# Patient Record
Sex: Female | Born: 1965 | Race: Black or African American | Hispanic: No | Marital: Married | Smoking: Never smoker
Health system: Southern US, Community
[De-identification: ages and names within clinical notes are randomized; demographics above are authoritative.]

## PROBLEM LIST (undated history)

## (undated) DIAGNOSIS — K529 Noninfective gastroenteritis and colitis, unspecified: Secondary | ICD-10-CM

## (undated) DIAGNOSIS — E669 Obesity, unspecified: Secondary | ICD-10-CM

## (undated) HISTORY — PX: TOTAL ABDOMINAL HYSTERECTOMY: SHX209

## (undated) HISTORY — DX: Noninfective gastroenteritis and colitis, unspecified: K52.9

## (undated) HISTORY — PX: CHOLECYSTECTOMY: SHX55

## (undated) HISTORY — PX: APPENDECTOMY: SHX54

---

## 2012-07-04 ENCOUNTER — Encounter (HOSPITAL_COMMUNITY): Payer: Self-pay | Admitting: Emergency Medicine

## 2012-07-04 ENCOUNTER — Emergency Department (HOSPITAL_COMMUNITY)
Admission: EM | Admit: 2012-07-04 | Discharge: 2012-07-04 | Disposition: A | Discharge status: Home / Self Care | Payer: BC Managed Care – PPO | Attending: Emergency Medicine | Admitting: Emergency Medicine

## 2012-07-04 ENCOUNTER — Emergency Department (HOSPITAL_COMMUNITY): Payer: BC Managed Care – PPO

## 2012-07-04 DIAGNOSIS — R630 Anorexia: Secondary | ICD-10-CM | POA: Insufficient documentation

## 2012-07-04 DIAGNOSIS — Z90710 Acquired absence of both cervix and uterus: Secondary | ICD-10-CM | POA: Insufficient documentation

## 2012-07-04 DIAGNOSIS — N390 Urinary tract infection, site not specified: Secondary | ICD-10-CM

## 2012-07-04 DIAGNOSIS — R319 Hematuria, unspecified: Secondary | ICD-10-CM | POA: Insufficient documentation

## 2012-07-04 DIAGNOSIS — R112 Nausea with vomiting, unspecified: Secondary | ICD-10-CM | POA: Insufficient documentation

## 2012-07-04 DIAGNOSIS — R109 Unspecified abdominal pain: Secondary | ICD-10-CM | POA: Insufficient documentation

## 2012-07-04 LAB — COMPREHENSIVE METABOLIC PANEL
ALT: 20 U/L (ref 0–35)
AST: 20 U/L (ref 0–37)
Albumin: 4 g/dL (ref 3.5–5.2)
Alkaline Phosphatase: 97 U/L (ref 39–117)
BUN: 9 mg/dL (ref 6–23)
CO2: 28 mEq/L (ref 19–32)
Calcium: 9.8 mg/dL (ref 8.4–10.5)
Chloride: 102 mEq/L (ref 96–112)
Creatinine, Ser: 0.77 mg/dL (ref 0.50–1.10)
GFR calc Af Amer: >90 mL/min (ref >90)
GFR calc non Af Amer: >90 mL/min (ref >90)
Glucose, Bld: 108 mg/dL — ABNORMAL HIGH (ref 70–99)
Potassium: 3.9 mEq/L (ref 3.5–5.1)
Sodium: 139 mEq/L (ref 135–145)
Total Bilirubin: 0.5 mg/dL (ref 0.3–1.2)
Total Protein: 8.4 g/dL — ABNORMAL HIGH (ref 6.0–8.3)

## 2012-07-04 LAB — URINALYSIS, ROUTINE W REFLEX MICROSCOPIC
Bilirubin Urine: NEGATIVE
Glucose, UA: NEGATIVE mg/dL
Hgb urine dipstick: NEGATIVE
Ketones, ur: NEGATIVE mg/dL
Nitrite: POSITIVE — AB
Protein, ur: NEGATIVE mg/dL
Specific Gravity, Urine: 1.022 (ref 1.005–1.030)
Urobilinogen, UA: 0.2 mg/dL (ref 0.0–1.0)
pH: 7.5 (ref 5.0–8.0)

## 2012-07-04 LAB — URINE MICROSCOPIC-ADD ON

## 2012-07-04 LAB — CBC
HCT: 40.8 % (ref 36.0–46.0)
Hemoglobin: 13.2 g/dL (ref 12.0–15.0)
MCH: 25.2 pg — ABNORMAL LOW (ref 26.0–34.0)
MCHC: 32.4 g/dL (ref 30.0–36.0)
MCV: 77.9 fL — ABNORMAL LOW (ref 78.0–100.0)
Platelets: 294 10*3/uL (ref 150–400)
RBC: 5.24 MIL/uL — ABNORMAL HIGH (ref 3.87–5.11)
RDW: 14.9 % (ref 11.5–15.5)
WBC: 3.7 10*3/uL — ABNORMAL LOW (ref 4.0–10.5)

## 2012-07-04 LAB — LIPASE, BLOOD: Lipase: 35 U/L (ref 11–59)

## 2012-07-04 MED ORDER — SULFAMETHOXAZOLE-TRIMETHOPRIM 800-160 MG PO TABS: 1.0000 | ORAL_TABLET | Freq: Two times a day (BID) | ORAL | Status: DC

## 2012-07-04 MED ORDER — ONDANSETRON HCL 4 MG/2ML IJ SOLN
4.0000 mg | Freq: Once | INTRAMUSCULAR | Status: AC
  Administered 2012-07-04: 4 mg via INTRAVENOUS
  Filled 2012-07-04: qty 2

## 2012-07-04 MED ORDER — HYDROMORPHONE HCL PF 1 MG/ML IJ SOLN
1.0000 mg | INTRAMUSCULAR | Status: AC
  Administered 2012-07-04: 1 mg via INTRAVENOUS
  Filled 2012-07-04: qty 1

## 2012-07-04 MED ORDER — SULFAMETHOXAZOLE-TMP DS 800-160 MG PO TABS: 1.0000 | ORAL_TABLET | Freq: Once | ORAL | Status: DC

## 2012-07-04 MED ORDER — IOHEXOL 300 MG/ML  SOLN
20.0000 mL | INTRAMUSCULAR | Status: AC
  Administered 2012-07-04 (×2): 20 mL via ORAL

## 2012-07-04 MED ORDER — HYDROMORPHONE HCL PF 1 MG/ML IJ SOLN: 1.0000 mg | Freq: Once | INTRAMUSCULAR | Status: DC

## 2012-07-04 MED ORDER — ONDANSETRON HCL 4 MG PO TABS: 4.0000 mg | ORAL_TABLET | Freq: Four times a day (QID) | ORAL | Status: DC

## 2012-07-04 MED ORDER — ONDANSETRON HCL 4 MG/2ML IJ SOLN: 4.0000 mg | Freq: Once | INTRAMUSCULAR | Status: DC

## 2012-07-04 MED ORDER — SODIUM CHLORIDE 0.9 % IV BOLUS (SEPSIS)
1000.0000 mL | Freq: Once | INTRAVENOUS | Status: AC
  Administered 2012-07-04: 1000 mL via INTRAVENOUS

## 2012-07-04 MED ORDER — HYDROMORPHONE HCL PF 1 MG/ML IJ SOLN
1.0000 mg | Freq: Once | INTRAMUSCULAR | Status: AC
  Administered 2012-07-04: 1 mg via INTRAVENOUS
  Filled 2012-07-04: qty 1

## 2012-07-04 MED ORDER — HYDROCODONE-ACETAMINOPHEN 5-325 MG PO TABS: 1.0000 | ORAL_TABLET | Freq: Four times a day (QID) | ORAL | Status: DC | PRN

## 2012-07-04 MED ORDER — IOHEXOL 300 MG/ML  SOLN
100.0000 mL | Freq: Once | INTRAMUSCULAR | Status: AC | PRN
  Administered 2012-07-04: 100 mL via INTRAVENOUS

## 2012-07-04 NOTE — ED Notes (Signed)
Pt returned from ct.  Pt lying in bed with call bell at side.

## 2012-07-04 NOTE — ED Notes (Signed)
Pt c/o lower abd and lower back pain onset several days ago.  Also c/o nausea and vomiting no diarrhea.  St's she has been passing blood in her urine.

## 2012-07-04 NOTE — ED Notes (Signed)
IV access attempted by 2 RN's. Unable to establish access. IV team paged.

## 2012-07-04 NOTE — ED Provider Notes (Signed)
History     CSN: [id number]  Arrival date & time 07/04/12  1522   First MD Initiated Contact with Patient 07/04/12 1730      Chief Complaint  Patient presents with  . Abdominal Pain    HPI The patient presents with concerns of ongoing abdominal pain, nausea, vomiting.  Symptoms began several days ago constantly.  Since onset symptoms have been worsening.  Since onset the patient notes anorexia, postprandial nausea and emesis.  No bowel movements. She denies fevers, chills, chest pain, dyspnea. She is a notable history of prior hysterectomy, as well as cholecystectomy, appendectomy. Since onset no relief with OTC medication.  No clear exacerbating factors beyond postprandial emesis.   History reviewed. No pertinent past medical history.  Past Surgical History  Procedure Date  . Abdominal hysterectomy   . Cholecystectomy     No family history on file.  History  Substance Use Topics  . Smoking status: Never Smoker   . Smokeless tobacco: Not on file  . Alcohol Use: No    OB History    Grav Para Term Preterm Abortions TAB SAB Ect Mult Living                  Review of Systems  Constitutional:       Per HPI, otherwise negative  HENT:       Per HPI, otherwise negative  Eyes: Negative.   Respiratory:       Per HPI, otherwise negative  Cardiovascular:       Per HPI, otherwise negative  Gastrointestinal: Positive for nausea, vomiting and abdominal pain. Negative for diarrhea.  Genitourinary: Positive for hematuria.  Musculoskeletal:       Per HPI, otherwise negative  Skin: Negative.   Neurological: Negative for syncope.    Allergies  Review of patient's allergies indicates not on file.  Home Medications  No current outpatient prescriptions on file.  BP 113/79  Pulse 65  Temp 98.7 F (37.1 C) (Oral)  Resp 18  SpO2 100%  Physical Exam  Nursing note and vitals reviewed. Constitutional: She is oriented to person, place, and time. She appears  well-developed and well-nourished. No distress.  HENT:  Head: Normocephalic and atraumatic.  Eyes: Conjunctivae normal and EOM are normal.  Cardiovascular: Normal rate and regular rhythm.   Pulmonary/Chest: Effort normal and breath sounds normal. No stridor. No respiratory distress.  Abdominal: She exhibits no distension. There is generalized tenderness. There is guarding. There is no rigidity and no rebound.  Musculoskeletal: She exhibits no edema.  Neurological: She is alert and oriented to person, place, and time. No cranial nerve deficit.  Skin: Skin is warm and dry.  Psychiatric: She has a normal mood and affect.    ED Course  Procedures (including critical care time)  Labs Reviewed  URINALYSIS, ROUTINE W REFLEX MICROSCOPIC - Abnormal; Notable for the following:    APPearance CLOUDY (*)     Nitrite POSITIVE (*)     Leukocytes, UA SMALL (*)     All other components within normal limits  CBC - Abnormal; Notable for the following:    WBC 3.7 (*)     RBC 5.24 (*)     MCV 77.9 (*)     MCH 25.2 (*)     All other components within normal limits  COMPREHENSIVE METABOLIC PANEL - Abnormal; Notable for the following:    Glucose, Bld 108 (*)     Total Protein 8.4 (*)     All other  components within normal limits  URINE MICROSCOPIC-ADD ON - Abnormal; Notable for the following:    Squamous Epithelial / LPF MANY (*)     Bacteria, UA MANY (*)     All other components within normal limits  LIPASE, BLOOD  URINE CULTURE   No results found.   No diagnosis found.  CT reviewed with the patient.  MDM  This female presents with concerns of abdominal, back pain.  On exam she is in no distress with unremarkable vital signs.  Given her history of multiple prior abdominal surgery, persistent nausea, worse postprandially, there suspicion for SBO.  The patient's CT, and labs were largely reassuring, though there is evidence of a urinary tract infection, as well as renal cysts, pulmonary nodule.   I discussed the need for ongoing evaluation with a primary care physician as well as a urologist.  Absent distress, and with unremarkable vital signs, the patient was discharged in stable condition to  Gerhard Munch, MD 07/04/12 2302

## 2012-07-04 NOTE — ED Notes (Signed)
Pt presents to department for evaluation of lower back and lower abdominal pain. Ongoing x1 week. Also states nausea/vomiting. Also states hematuria. Last BM on Saturday was normal. 9/10 pain at the time. Bowel sounds present all quadrants. Abdomen soft and non tender to palpation. She is alert and oriented x4.

## 2012-07-07 LAB — URINE CULTURE: Colony Count: >=100000

## 2012-07-07 NOTE — ED Notes (Signed)
Copy of chart to EDP office for review of positive urine culture for ESBL.

## 2012-07-10 ENCOUNTER — Telehealth (HOSPITAL_COMMUNITY): Payer: Self-pay | Admitting: Emergency Medicine

## 2012-07-10 NOTE — ED Notes (Signed)
Chart returned from EDP office. Per Rhea Bleacher PA-C, sensitive to Bactrim, patient Rx Bactrim. Follow-up with PCP to ensure resolution.

## 2013-01-09 ENCOUNTER — Encounter (HOSPITAL_COMMUNITY): Payer: Self-pay | Admitting: Emergency Medicine

## 2013-01-09 ENCOUNTER — Inpatient Hospital Stay (HOSPITAL_COMMUNITY)
Admission: RE | Admit: 2013-01-09 | Discharge: 2013-01-09 | Disposition: A | Discharge status: Home / Self Care | Payer: BC Managed Care – PPO | Source: Ambulatory Visit

## 2013-01-09 ENCOUNTER — Emergency Department (HOSPITAL_COMMUNITY)
Admission: EM | Admit: 2013-01-09 | Discharge: 2013-01-09 | Disposition: A | Discharge status: Home / Self Care | Payer: Self-pay | Attending: Emergency Medicine | Admitting: Emergency Medicine

## 2013-01-09 DIAGNOSIS — M79605 Pain in left leg: Secondary | ICD-10-CM

## 2013-01-09 DIAGNOSIS — M7989 Other specified soft tissue disorders: Secondary | ICD-10-CM

## 2013-01-09 DIAGNOSIS — M79609 Pain in unspecified limb: Secondary | ICD-10-CM

## 2013-01-09 DIAGNOSIS — R609 Edema, unspecified: Secondary | ICD-10-CM | POA: Insufficient documentation

## 2013-01-09 LAB — CBC WITH DIFFERENTIAL/PLATELET
Basophils Absolute: 0 10*3/uL (ref 0.0–0.1)
Basophils Relative: 1 % (ref 0–1)
Eosinophils Absolute: 0.3 10*3/uL (ref 0.0–0.7)
Eosinophils Relative: 6 % — ABNORMAL HIGH (ref 0–5)
HCT: 34.3 % — ABNORMAL LOW (ref 36.0–46.0)
Hemoglobin: 11.1 g/dL — ABNORMAL LOW (ref 12.0–15.0)
Lymphocytes Relative: 53 % — ABNORMAL HIGH (ref 12–46)
Lymphs Abs: 2.6 10*3/uL (ref 0.7–4.0)
MCH: 24.9 pg — ABNORMAL LOW (ref 26.0–34.0)
MCHC: 32.4 g/dL (ref 30.0–36.0)
MCV: 77.1 fL — ABNORMAL LOW (ref 78.0–100.0)
Monocytes Absolute: 0.3 10*3/uL (ref 0.1–1.0)
Monocytes Relative: 7 % (ref 3–12)
Neutro Abs: 1.6 10*3/uL — ABNORMAL LOW (ref 1.7–7.7)
Neutrophils Relative %: 34 % — ABNORMAL LOW (ref 43–77)
Platelets: 267 10*3/uL (ref 150–400)
RBC: 4.45 MIL/uL (ref 3.87–5.11)
RDW: 15 % (ref 11.5–15.5)
WBC: 4.8 10*3/uL (ref 4.0–10.5)

## 2013-01-09 LAB — BASIC METABOLIC PANEL
BUN: 14 mg/dL (ref 6–23)
CO2: 25 mEq/L (ref 19–32)
Calcium: 8.9 mg/dL (ref 8.4–10.5)
Chloride: 106 mEq/L (ref 96–112)
Creatinine, Ser: 0.78 mg/dL (ref 0.50–1.10)
GFR calc Af Amer: >90 mL/min (ref >90)
GFR calc non Af Amer: >90 mL/min (ref >90)
Glucose, Bld: 118 mg/dL — ABNORMAL HIGH (ref 70–99)
Potassium: 3.7 mEq/L (ref 3.5–5.1)
Sodium: 137 mEq/L (ref 135–145)

## 2013-01-09 LAB — PROTIME-INR
INR: 0.94 (ref 0.00–1.49)
Prothrombin Time: 12.4 seconds (ref 11.6–15.2)

## 2013-01-09 LAB — APTT: aPTT: 33 seconds (ref 24–37)

## 2013-01-09 MED ORDER — OXYCODONE-ACETAMINOPHEN 5-325 MG PO TABS
2.0000 | ORAL_TABLET | Freq: Once | ORAL | Status: AC
  Administered 2013-01-09: 2 via ORAL
  Filled 2013-01-09: qty 2

## 2013-01-09 MED ORDER — ENOXAPARIN SODIUM 120 MG/0.8ML ~~LOC~~ SOLN
110.0000 mg | Freq: Once | SUBCUTANEOUS | Status: AC
  Administered 2013-01-09: 110 mg via SUBCUTANEOUS
  Filled 2013-01-09: qty 0.8

## 2013-01-09 MED ORDER — OXYCODONE-ACETAMINOPHEN 5-325 MG PO TABS: 1.0000 | ORAL_TABLET | Freq: Four times a day (QID) | ORAL | Status: DC | PRN

## 2013-01-09 NOTE — ED Provider Notes (Signed)
History    CSN: [id number] Arrival date & time 01/09/13  0002  First MD Initiated Contact with Patient 01/09/13 [phone]     Chief Complaint  Patient presents with  . Leg Pain   (Consider location/radiation/quality/duration/timing/severity/associated sxs/prior Treatment) Patient is a 47 y.o. female presenting with leg pain.  Leg Pain  Pt with no significant PMH reports she is visiting family from out of town. She has had about a week of progressively worsening L leg pain and swelling. States pain is so severe now she is unable to walk. No back pain or numbness. No previous history of same. No right sided symptoms. She denies any CP, SOB. No recent extended travel or surgery. No history of cancer.   History reviewed. No pertinent past medical history. Past Surgical History  Procedure Laterality Date  . Abdominal hysterectomy    . Cholecystectomy     No family history on file. History  Substance Use Topics  . Smoking status: Never Smoker   . Smokeless tobacco: Not on file  . Alcohol Use: No   OB History   Grav Para Term Preterm Abortions TAB SAB Ect Mult Living                 Review of Systems All other systems reviewed and are negative except as noted in HPI.   Allergies  Review of patient's allergies indicates no known allergies.  Home Medications   Current Outpatient Rx  Name  Route  Sig  Dispense  Refill  . ibuprofen (ADVIL,MOTRIN) 200 MG tablet   Oral   Take 400 mg by mouth every 8 (eight) hours as needed for pain.         . Multiple Vitamin (MULTIVITAMIN WITH MINERALS) TABS   Oral   Take 1 tablet by mouth daily.         Marland Kitchen OVER THE COUNTER MEDICATION   Oral   Take 1 tablet by mouth once. Over the counter Water Pill          BP 134/62  Pulse 87  Temp(Src) 98.2 F (36.8 C) (Oral)  Resp 20  SpO2 98% Physical Exam  Nursing note and vitals reviewed. Constitutional: She is oriented to person, place, and time. She appears well-developed and  well-nourished.  HENT:  Head: Normocephalic and atraumatic.  Eyes: EOM are normal. Pupils are equal, round, and reactive to light.  Neck: Normal range of motion. Neck supple.  Cardiovascular: Normal rate, normal heart sounds and intact distal pulses.   Pulmonary/Chest: Effort normal and breath sounds normal.  Abdominal: Bowel sounds are normal. She exhibits no distension. There is no tenderness.  Musculoskeletal: Normal range of motion. She exhibits edema (2+ L lower leg) and tenderness (diffuse L leg including calf and medial thigh).  Neurological: She is alert and oriented to person, place, and time. She has normal strength. No cranial nerve deficit or sensory deficit.  Skin: Skin is warm and dry. No rash noted.  Psychiatric: She has a normal mood and affect.    ED Course  Procedures (including critical care time) Labs Reviewed  CBC WITH DIFFERENTIAL - Abnormal; Notable for the following:    Hemoglobin 11.1 (*)    HCT 34.3 (*)    MCV 77.1 (*)    MCH 24.9 (*)    Neutrophils Relative % 34 (*)    Neutro Abs 1.6 (*)    Lymphocytes Relative 53 (*)    Eosinophils Relative 6 (*)    All other components within  normal limits  BASIC METABOLIC PANEL - Abnormal; Notable for the following:    Glucose, Bld 118 (*)    All other components within normal limits  PROTIME-INR  APTT   No results found. 1. Peripheral edema   2. Leg pain, left     MDM  Labs unremarkable. Will d/c with Lovenox and outpatient doppler to rule out DVT. No signs or symptoms of PE.   Charles B. Bernette Mayers, MD 01/09/13 [phone]

## 2013-01-09 NOTE — Progress Notes (Signed)
VASCULAR LAB PRELIMINARY  PRELIMINARY  PRELIMINARY  PRELIMINARY  Left lower extremity venous duplex completed.    Preliminary report:  Left:  No evidence of DVT, superficial thrombosis, or Baker's cyst.   Mishelle Hassan, RVT 01/09/2013, 8:42 AM

## 2013-01-09 NOTE — ED Notes (Signed)
Pt c/o her lt leg swollen for the past 2-3 days.  Leg temp the same as the rt.  Pedal pulses  present
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
## 2013-01-09 NOTE — ED Notes (Signed)
5 4  240 

## 2013-08-25 ENCOUNTER — Encounter (HOSPITAL_COMMUNITY): Payer: Self-pay | Admitting: Emergency Medicine

## 2013-08-25 ENCOUNTER — Emergency Department (HOSPITAL_COMMUNITY)
Admission: EM | Admit: 2013-08-25 | Discharge: 2013-08-25 | Disposition: A | Discharge status: Home / Self Care | Payer: BC Managed Care – PPO | Attending: Emergency Medicine | Admitting: Emergency Medicine

## 2013-08-25 ENCOUNTER — Emergency Department (HOSPITAL_COMMUNITY): Payer: BC Managed Care – PPO

## 2013-08-25 DIAGNOSIS — S0990XA Unspecified injury of head, initial encounter: Secondary | ICD-10-CM | POA: Insufficient documentation

## 2013-08-25 DIAGNOSIS — Z79899 Other long term (current) drug therapy: Secondary | ICD-10-CM | POA: Insufficient documentation

## 2013-08-25 DIAGNOSIS — E669 Obesity, unspecified: Secondary | ICD-10-CM | POA: Insufficient documentation

## 2013-08-25 DIAGNOSIS — IMO0002 Reserved for concepts with insufficient information to code with codable children: Secondary | ICD-10-CM | POA: Insufficient documentation

## 2013-08-25 DIAGNOSIS — Y9389 Activity, other specified: Secondary | ICD-10-CM | POA: Insufficient documentation

## 2013-08-25 DIAGNOSIS — Y92410 Unspecified street and highway as the place of occurrence of the external cause: Secondary | ICD-10-CM | POA: Insufficient documentation

## 2013-08-25 MED ORDER — METHOCARBAMOL 500 MG PO TABS: 500.0000 mg | ORAL_TABLET | Freq: Two times a day (BID) | ORAL | Status: DC

## 2013-08-25 MED ORDER — HYDROCODONE-ACETAMINOPHEN 5-325 MG PO TABS
1.0000 | ORAL_TABLET | Freq: Once | ORAL | Status: AC
  Administered 2013-08-25: 1 via ORAL
  Filled 2013-08-25: qty 1

## 2013-08-25 MED ORDER — METHOCARBAMOL 500 MG PO TABS
500.0000 mg | ORAL_TABLET | Freq: Once | ORAL | Status: AC
  Administered 2013-08-25: 500 mg via ORAL
  Filled 2013-08-25: qty 1

## 2013-08-25 MED ORDER — HYDROCODONE-ACETAMINOPHEN 5-325 MG PO TABS: 2.0000 | ORAL_TABLET | ORAL | Status: DC | PRN
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
## 2013-08-29 NOTE — ED Provider Notes (Signed)
Medical screening examination/treatment/procedure(s) were performed by non-physician practitioner and as supervising physician I was immediately available for consultation/collaboration.  EKG Interpretation   None         Katiejo Gilroy, MD 08/29/13 0739 

## 2013-09-26 ENCOUNTER — Other Ambulatory Visit: Payer: Self-pay | Admitting: Orthopedic Surgery

## 2013-09-26 DIAGNOSIS — IMO0002 Reserved for concepts with insufficient information to code with codable children: Secondary | ICD-10-CM

## 2013-09-27 ENCOUNTER — Ambulatory Visit
Admission: RE | Admit: 2013-09-27 | Discharge: 2013-09-27 | Disposition: A | Discharge status: Home / Self Care | Payer: BC Managed Care – PPO | Source: Ambulatory Visit | Attending: Orthopedic Surgery | Admitting: Orthopedic Surgery

## 2013-09-27 DIAGNOSIS — IMO0002 Reserved for concepts with insufficient information to code with codable children: Secondary | ICD-10-CM

## 2013-10-06 ENCOUNTER — Encounter (HOSPITAL_COMMUNITY): Payer: Self-pay | Admitting: Emergency Medicine

## 2013-10-06 DIAGNOSIS — R197 Diarrhea, unspecified: Secondary | ICD-10-CM | POA: Diagnosis not present

## 2013-10-06 DIAGNOSIS — N390 Urinary tract infection, site not specified: Secondary | ICD-10-CM | POA: Diagnosis present

## 2013-10-06 DIAGNOSIS — E669 Obesity, unspecified: Secondary | ICD-10-CM | POA: Diagnosis present

## 2013-10-06 DIAGNOSIS — K5900 Constipation, unspecified: Secondary | ICD-10-CM | POA: Diagnosis not present

## 2013-10-06 DIAGNOSIS — Z9089 Acquired absence of other organs: Secondary | ICD-10-CM

## 2013-10-06 DIAGNOSIS — Z6841 Body Mass Index (BMI) 40.0 and over, adult: Secondary | ICD-10-CM

## 2013-10-06 DIAGNOSIS — N200 Calculus of kidney: Secondary | ICD-10-CM | POA: Diagnosis present

## 2013-10-06 DIAGNOSIS — A09 Infectious gastroenteritis and colitis, unspecified: Principal | ICD-10-CM | POA: Diagnosis present

## 2013-10-06 NOTE — ED Notes (Signed)
Pt. reports low abdominal cramping with nausea and vomitting onset today , denies diarrhea or urinary discomfort .

## 2013-10-07 ENCOUNTER — Inpatient Hospital Stay (HOSPITAL_COMMUNITY)
Admission: EM | Admit: 2013-10-07 | Discharge: 2013-10-13 | DRG: 392 | Disposition: A | Discharge status: Home / Self Care | Payer: BC Managed Care – PPO | Attending: Internal Medicine | Admitting: Internal Medicine

## 2013-10-07 ENCOUNTER — Emergency Department (HOSPITAL_COMMUNITY): Payer: BC Managed Care – PPO

## 2013-10-07 ENCOUNTER — Encounter (HOSPITAL_COMMUNITY): Payer: Self-pay | Admitting: Radiology

## 2013-10-07 DIAGNOSIS — N390 Urinary tract infection, site not specified: Secondary | ICD-10-CM

## 2013-10-07 DIAGNOSIS — R109 Unspecified abdominal pain: Secondary | ICD-10-CM

## 2013-10-07 DIAGNOSIS — K529 Noninfective gastroenteritis and colitis, unspecified: Secondary | ICD-10-CM | POA: Diagnosis present

## 2013-10-07 DIAGNOSIS — K5289 Other specified noninfective gastroenteritis and colitis: Secondary | ICD-10-CM

## 2013-10-07 DIAGNOSIS — R110 Nausea: Secondary | ICD-10-CM

## 2013-10-07 HISTORY — DX: Obesity, unspecified: E66.9

## 2013-10-07 LAB — CBC WITH DIFFERENTIAL/PLATELET
Basophils Absolute: 0 10*3/uL (ref 0.0–0.1)
Basophils Relative: 1 % (ref 0–1)
Eosinophils Absolute: 0.3 10*3/uL (ref 0.0–0.7)
Eosinophils Relative: 6 % — ABNORMAL HIGH (ref 0–5)
HCT: 37.3 % (ref 36.0–46.0)
Hemoglobin: 12.3 g/dL (ref 12.0–15.0)
Lymphocytes Relative: 42 % (ref 12–46)
Lymphs Abs: 1.9 10*3/uL (ref 0.7–4.0)
MCH: 25.7 pg — ABNORMAL LOW (ref 26.0–34.0)
MCHC: 33 g/dL (ref 30.0–36.0)
MCV: 78 fL (ref 78.0–100.0)
Monocytes Absolute: 0.4 10*3/uL (ref 0.1–1.0)
Monocytes Relative: 9 % (ref 3–12)
Neutro Abs: 1.9 10*3/uL (ref 1.7–7.7)
Neutrophils Relative %: 42 % — ABNORMAL LOW (ref 43–77)
Platelets: 310 10*3/uL (ref 150–400)
RBC: 4.78 MIL/uL (ref 3.87–5.11)
RDW: 15.9 % — ABNORMAL HIGH (ref 11.5–15.5)
WBC: 4.4 10*3/uL (ref 4.0–10.5)

## 2013-10-07 LAB — COMPREHENSIVE METABOLIC PANEL
ALT: 16 U/L (ref 0–35)
AST: 16 U/L (ref 0–37)
Albumin: 3.5 g/dL (ref 3.5–5.2)
Alkaline Phosphatase: 75 U/L (ref 39–117)
BUN: 11 mg/dL (ref 6–23)
CO2: 23 mEq/L (ref 19–32)
Calcium: 9.1 mg/dL (ref 8.4–10.5)
Chloride: 104 mEq/L (ref 96–112)
Creatinine, Ser: 0.75 mg/dL (ref 0.50–1.10)
GFR calc Af Amer: >90 mL/min (ref >90)
GFR calc non Af Amer: >90 mL/min (ref >90)
Glucose, Bld: 112 mg/dL — ABNORMAL HIGH (ref 70–99)
Potassium: 3.9 mEq/L (ref 3.7–5.3)
Sodium: 139 mEq/L (ref 137–147)
Total Bilirubin: 0.3 mg/dL (ref 0.3–1.2)
Total Protein: 7.1 g/dL (ref 6.0–8.3)

## 2013-10-07 LAB — HIV ANTIBODY (ROUTINE TESTING W REFLEX): HIV: NONREACTIVE

## 2013-10-07 LAB — URINALYSIS, ROUTINE W REFLEX MICROSCOPIC
Bilirubin Urine: NEGATIVE
Glucose, UA: NEGATIVE mg/dL
Ketones, ur: 15 mg/dL — AB
Nitrite: POSITIVE — AB
Protein, ur: NEGATIVE mg/dL
Specific Gravity, Urine: 1.025 (ref 1.005–1.030)
Urobilinogen, UA: 0.2 mg/dL (ref 0.0–1.0)
pH: 6 (ref 5.0–8.0)

## 2013-10-07 LAB — C-REACTIVE PROTEIN: CRP: <0.5 mg/dL — ABNORMAL LOW (ref <0.60)

## 2013-10-07 LAB — URINE MICROSCOPIC-ADD ON

## 2013-10-07 LAB — SEDIMENTATION RATE: Sed Rate: 17 mm/hr (ref 0–22)

## 2013-10-07 LAB — LACTIC ACID, PLASMA: Lactic Acid, Venous: 1.1 mmol/L (ref 0.5–2.2)

## 2013-10-07 LAB — LIPASE, BLOOD: Lipase: 21 U/L (ref 11–59)

## 2013-10-07 MED ORDER — ONDANSETRON HCL 4 MG/2ML IJ SOLN
4.0000 mg | Freq: Once | INTRAMUSCULAR | Status: AC
  Administered 2013-10-07: 4 mg via INTRAVENOUS
  Filled 2013-10-07: qty 2

## 2013-10-07 MED ORDER — HYDROMORPHONE HCL PF 1 MG/ML IJ SOLN
1.0000 mg | INTRAMUSCULAR | Status: DC | PRN
  Administered 2013-10-07 – 2013-10-08 (×3): 2 mg via INTRAVENOUS
  Filled 2013-10-07: qty 1
  Filled 2013-10-07: qty 2
  Filled 2013-10-07: qty 1
  Filled 2013-10-07: qty 2

## 2013-10-07 MED ORDER — IOHEXOL 300 MG/ML  SOLN
25.0000 mL | Freq: Once | INTRAMUSCULAR | Status: AC | PRN
  Administered 2013-10-07: 25 mL via ORAL

## 2013-10-07 MED ORDER — PROMETHAZINE HCL 25 MG/ML IJ SOLN
6.2500 mg | Freq: Four times a day (QID) | INTRAMUSCULAR | Status: DC | PRN
  Administered 2013-10-07 – 2013-10-09 (×7): 6.25 mg via INTRAVENOUS
  Filled 2013-10-07 (×7): qty 1

## 2013-10-07 MED ORDER — MORPHINE SULFATE 4 MG/ML IJ SOLN
4.0000 mg | Freq: Once | INTRAMUSCULAR | Status: AC
  Administered 2013-10-07: 4 mg via INTRAVENOUS
  Filled 2013-10-07: qty 1

## 2013-10-07 MED ORDER — HYDROMORPHONE HCL PF 1 MG/ML IJ SOLN
2.0000 mg | INTRAMUSCULAR | Status: DC | PRN
Start: 2013-10-07 — End: 2013-10-07
  Administered 2013-10-07 (×2): 2 mg via INTRAVENOUS
  Filled 2013-10-07 (×2): qty 2

## 2013-10-07 MED ORDER — IOHEXOL 300 MG/ML  SOLN
80.0000 mL | Freq: Once | INTRAMUSCULAR | Status: AC | PRN
  Administered 2013-10-07: 80 mL via INTRAVENOUS

## 2013-10-07 MED ORDER — LEVOFLOXACIN IN D5W 500 MG/100ML IV SOLN
500.0000 mg | Freq: Once | INTRAVENOUS | Status: AC
  Administered 2013-10-07: 500 mg via INTRAVENOUS
  Filled 2013-10-07: qty 100

## 2013-10-07 MED ORDER — MORPHINE SULFATE 4 MG/ML IJ SOLN
4.0000 mg | Freq: Once | INTRAMUSCULAR | Status: AC
Start: 2013-10-07 — End: 2013-10-07
  Administered 2013-10-07: 4 mg via INTRAVENOUS
  Filled 2013-10-07: qty 1

## 2013-10-07 MED ORDER — ONDANSETRON HCL 4 MG/2ML IJ SOLN
4.0000 mg | Freq: Four times a day (QID) | INTRAMUSCULAR | Status: DC | PRN
  Administered 2013-10-07: 4 mg via INTRAVENOUS
  Filled 2013-10-07: qty 2

## 2013-10-07 MED ORDER — HYDROMORPHONE HCL PF 1 MG/ML IJ SOLN
1.0000 mg | INTRAMUSCULAR | Status: DC | PRN
  Administered 2013-10-07: 1 mg via INTRAVENOUS
  Filled 2013-10-07 (×2): qty 1

## 2013-10-07 MED ORDER — DEXTROSE-NACL 5-0.9 % IV SOLN
INTRAVENOUS | Status: AC
  Administered 2013-10-07: 13:00:00 via INTRAVENOUS

## 2013-10-07 MED ORDER — METRONIDAZOLE IN NACL 5-0.79 MG/ML-% IV SOLN
500.0000 mg | Freq: Three times a day (TID) | INTRAVENOUS | Status: DC
  Administered 2013-10-07 – 2013-10-09 (×7): 500 mg via INTRAVENOUS
  Filled 2013-10-07 (×8): qty 100

## 2013-10-07 MED ORDER — ONDANSETRON HCL 4 MG PO TABS: 4.0000 mg | ORAL_TABLET | Freq: Four times a day (QID) | ORAL | Status: DC | PRN

## 2013-10-07 MED ORDER — CIPROFLOXACIN IN D5W 400 MG/200ML IV SOLN
400.0000 mg | Freq: Two times a day (BID) | INTRAVENOUS | Status: DC
  Administered 2013-10-07 – 2013-10-09 (×5): 400 mg via INTRAVENOUS
  Filled 2013-10-07 (×7): qty 200

## 2013-10-07 MED ORDER — METRONIDAZOLE IN NACL 5-0.79 MG/ML-% IV SOLN
500.0000 mg | Freq: Once | INTRAVENOUS | Status: AC
  Administered 2013-10-07: 500 mg via INTRAVENOUS
  Filled 2013-10-07: qty 100

## 2013-10-07 MED ORDER — HYDROMORPHONE HCL PF 1 MG/ML IJ SOLN
1.0000 mg | Freq: Once | INTRAMUSCULAR | Status: AC
  Administered 2013-10-07: 1 mg via INTRAVENOUS
  Filled 2013-10-07: qty 1

## 2013-10-07 MED ORDER — ENOXAPARIN SODIUM 40 MG/0.4ML ~~LOC~~ SOLN
40.0000 mg | Freq: Every day | SUBCUTANEOUS | Status: DC
  Administered 2013-10-07 – 2013-10-13 (×7): 40 mg via SUBCUTANEOUS
  Filled 2013-10-07 (×7): qty 0.4

## 2013-10-07 NOTE — Consult Note (Signed)
Reason for Consult: ileitis  Referring Physician: Dr. Luanne Bras  Connie Mcneil is an 48 y.o. female.  HPI: Patient developed nausea vomiting and lower abdominal pain yesterday. She was evaluated in the emergency department. She was found to Have unremarkable laboratory values. CT scan of the abdomen and pelvis shows distal ileitis. She is continuing to have some pain. We are asked to see her from a surgical standpoint in consultation for assistance in management. The pain is crampy in nature. The medicine has helped since admission. She denies other similar episodes.  Past Medical History  Diagnosis Date  . Obesity     Past Surgical History  Procedure Laterality Date  . Abdominal hysterectomy    . Cholecystectomy    . Appendectomy      No family history on file.  Social History:  reports that she has never smoked. She does not have any smokeless tobacco history on file. She reports that she does not drink alcohol or use illicit drugs.  Allergies: No Known Allergies  Medications:  Scheduled: . ciprofloxacin  400 mg Intravenous Q12H  . enoxaparin (LOVENOX) injection  40 mg Subcutaneous Daily  . metronidazole  500 mg Intravenous Q8H   Continuous: . dextrose 5 % and 0.9% NaCl 100 mL/hr at 10/07/13 1235   EUM:[id number] (DILAUDID) injection, promethazine  Results for orders placed during the hospital encounter of 10/07/13 (from the past 48 hour(s))  CBC WITH DIFFERENTIAL     Status: Abnormal   Collection Time    10/07/13 12:30 AM      Result Value Ref Range   WBC 4.4  4.0 - 10.5 K/uL   RBC 4.78  3.87 - 5.11 MIL/uL   Hemoglobin 12.3  12.0 - 15.0 g/dL   HCT 37.3  36.0 - 46.0 %   MCV 78.0  78.0 - 100.0 fL   MCH 25.7 (*) 26.0 - 34.0 pg   MCHC 33.0  30.0 - 36.0 g/dL   RDW 15.9 (*) 11.5 - 15.5 %   Platelets 310  150 - 400 K/uL   Neutrophils Relative % 42 (*) 43 - 77 %   Neutro Abs 1.9  1.7 - 7.7 K/uL   Lymphocytes Relative 42  12 - 46 %   Lymphs Abs 1.9  0.7 - 4.0 K/uL    Monocytes Relative 9  3 - 12 %   Monocytes Absolute 0.4  0.1 - 1.0 K/uL   Eosinophils Relative 6 (*) 0 - 5 %   Eosinophils Absolute 0.3  0.0 - 0.7 K/uL   Basophils Relative 1  0 - 1 %   Basophils Absolute 0.0  0.0 - 0.1 K/uL  COMPREHENSIVE METABOLIC PANEL     Status: Abnormal   Collection Time    10/07/13 12:30 AM      Result Value Ref Range   Sodium 139  137 - 147 mEq/L   Potassium 3.9  3.7 - 5.3 mEq/L   Chloride 104  96 - 112 mEq/L   CO2 23  19 - 32 mEq/L   Glucose, Bld 112 (*) 70 - 99 mg/dL   BUN 11  6 - 23 mg/dL   Creatinine, Ser 0.75  0.50 - 1.10 mg/dL   Calcium 9.1  8.4 - 10.5 mg/dL   Total Protein 7.1  6.0 - 8.3 g/dL   Albumin 3.5  3.5 - 5.2 g/dL   AST 16  0 - 37 U/L   ALT 16  0 - 35 U/L   Alkaline Phosphatase 75  39 - 117 U/L   Total Bilirubin 0.3  0.3 - 1.2 mg/dL   GFR calc non Af Amer >90  >90 mL/min   GFR calc Af Amer >90  >90 mL/min   Comment: (NOTE)     The eGFR has been calculated using the CKD EPI equation.     This calculation has not been validated in all clinical situations.     eGFR's persistently <90 mL/min signify possible Chronic Kidney     Disease.  URINALYSIS, ROUTINE W REFLEX MICROSCOPIC     Status: Abnormal   Collection Time    10/07/13  4:06 AM      Result Value Ref Range   Color, Urine YELLOW  YELLOW   APPearance TURBID (*) CLEAR   Specific Gravity, Urine 1.025  1.005 - 1.030   pH 6.0  5.0 - 8.0   Glucose, UA NEGATIVE  NEGATIVE mg/dL   Hgb urine dipstick SMALL (*) NEGATIVE   Bilirubin Urine NEGATIVE  NEGATIVE   Ketones, ur 15 (*) NEGATIVE mg/dL   Protein, ur NEGATIVE  NEGATIVE mg/dL   Urobilinogen, UA 0.2  0.0 - 1.0 mg/dL   Nitrite POSITIVE (*) NEGATIVE   Leukocytes, UA LARGE (*) NEGATIVE  URINE MICROSCOPIC-ADD ON     Status: Abnormal   Collection Time    10/07/13  4:06 AM      Result Value Ref Range   Squamous Epithelial / LPF FEW (*) RARE   Bacteria, UA MANY (*) RARE   Crystals CA OXALATE CRYSTALS (*) NEGATIVE  LIPASE, BLOOD      Status: None   Collection Time    10/07/13  5:09 AM      Result Value Ref Range   Lipase 21  11 - 59 U/L  LACTIC ACID, PLASMA     Status: None   Collection Time    10/07/13 11:57 AM      Result Value Ref Range   Lactic Acid, Venous 1.1  0.5 - 2.2 mmol/L    Ct Abdomen Pelvis W Contrast  10/07/2013   CLINICAL DATA:  Lower abdominal cramping, nausea and vomiting.  EXAM: CT ABDOMEN AND PELVIS WITH CONTRAST  TECHNIQUE: Multidetector CT imaging of the abdomen and pelvis was performed using the standard protocol following bolus administration of intravenous contrast.  CONTRAST:  65m OMNIPAQUE IOHEXOL 300 MG/ML  SOLN  COMPARISON:  CT of the abdomen and pelvis performed 07/04/2012  FINDINGS: The visualized lung bases are clear. Mild left lower lobe bronchiectasis is noted, with mild associated atelectasis.  The liver and spleen are unremarkable in appearance. The patient is status post cholecystectomy, with clips noted at the gallbladder fossa. The pancreas and adrenal glands are unremarkable.  Innumerable scattered bilateral renal cysts are seen. A 3 mm stone is noted at the lower pole of the left kidney. The kidneys are otherwise unremarkable. There is no evidence of hydronephrosis. No obstructing ureteral stones are seen. No perinephric stranding is appreciated.  A relatively long segment of wall thickening is noted along the mid to distal ileum, without evidence for obstruction. Mild associated soft tissue inflammation is seen. Findings are concerning for infectious or inflammatory ileitis.  The stomach is within normal limits. No acute vascular abnormalities are seen.  The appendix is not definitely seen; there is no evidence of appendicitis. The colon is unremarkable in appearance.  The bladder is mildly distended and grossly unremarkable. No inguinal lymphadenopathy is seen.  No acute osseous abnormalities are identified.  IMPRESSION: 1. Relatively long segment  of wall thickening along the mid to distal  ileum, without evidence for obstruction. Mild associated soft tissue inflammation seen. Findings concerning for infectious or inflammatory ileitis. The more distal ileum is decompressed and grossly unremarkable. 2. Innumerable scattered bilateral renal cysts seen. 3 mm nonobstructing stone noted at the lower pole of the left kidney. 3. Mild left lower lobe bronchiectasis noted, with mild associated atelectasis.   Electronically Signed   By: Garald Balding M.D.   On: 10/07/2013 05:48    Review of Systems  Constitutional: Positive for malaise/fatigue.  HENT: Negative.   Eyes: Negative.   Respiratory: Negative.   Cardiovascular: Negative.   Gastrointestinal: Positive for nausea, vomiting and abdominal pain.  Genitourinary: Negative.   Musculoskeletal: Negative.   Skin: Negative.   Neurological: Negative.   Endo/Heme/Allergies: Negative.    Blood pressure 100/63, pulse 76, temperature 97.9 F (36.6 C), temperature source Oral, resp. rate 20, height _0  (1.626 m), weight 269 lb 4.8 oz (122.154 kg), SpO2 100.00%. Physical Exam  Constitutional: She is oriented to person, place, and time. She appears well-developed and well-nourished.  HENT:  Head: Normocephalic and atraumatic.  Mouth/Throat: No oropharyngeal exudate.  Voice very raspy  Eyes: Pupils are equal, round, and reactive to light. Right eye exhibits no discharge. Left eye exhibits no discharge.  Neck: Normal range of motion. Neck supple. No tracheal deviation present.  Cardiovascular: Normal rate, normal heart sounds and intact distal pulses.   Respiratory: Effort normal and breath sounds normal. No stridor. No respiratory distress. She has no wheezes. She has no rales.  GI: Soft. She exhibits no distension. There is tenderness. There is no rebound and no guarding.  Tender bilateral lower quadrants without guarding, no generalized peritonitis, hypoactive bowel sounds, no masses  Musculoskeletal: Normal range of motion.   Neurological: She is alert and oriented to person, place, and time.  Skin: Skin is warm.  Psychiatric: She has a normal mood and affect.    Assessment/Plan: Distal ileitis: this is a pretty long segment favoring infectious ideology. Inflammatory bowel disease such as Crohn's is also a possibility. Agree with bowel rest and IV antibiotics. Consider GI workup for inflammatory bowel disease. No need for acute surgical intervention but we will follow. Plan D/W patient.  Rossanna Spitzley E 10/07/2013, 2:58 PM

## 2013-10-07 NOTE — ED Notes (Signed)
Patient ready for transport.

## 2013-10-07 NOTE — Progress Notes (Signed)
Report received from ED at 0820 and pt arrived on the unit via stretcher at [phone]. Pt able to ambulate from the stretcher to the bed with supervision but noted to be unsteady on feet; IV remain intact and infusing; A&O x4. Pt reported N/V and an abd pain of 10 in which she describe it as labor pains. Pt oriented to the unit, call light with reach, VS taken, pt n/v and pain to be addressed. Call light with reach and will continue to monitor pt quietly. Arabella MerlesP. Amo Shasta Chinn RN.

## 2013-10-07 NOTE — Progress Notes (Signed)
Utilization review completed. Grainger Mccarley, RN, BSN. 

## 2013-10-07 NOTE — Progress Notes (Addendum)
RN received call from infectious disease to place pt on contact precaution d/t pt testing positive for EBL in recent admission in January. "contact precaution" explained to pt and pt voices understanding. Pt on contact precaution. Dionne BucyP. Amo Ellyson Rarick RN

## 2013-10-07 NOTE — Progress Notes (Signed)
Pt 12 lead EKG completed and placed in pt chart for MD to review. Will continue to monitor pt quietly. Arabella MerlesP. Amo Allure Greaser RN.

## 2013-10-07 NOTE — ED Provider Notes (Signed)
CSN: [id number]     Arrival date & time 10/06/13  2334 History   First MD Initiated Contact with Patient 10/07/13 [phone]     Chief Complaint  Patient presents with  . Abdominal Pain     (Consider location/radiation/quality/duration/timing/severity/associated sxs/prior Treatment) HPI Comments: 48 yo female with h/o cholecystectomy and appendectomy and TAH (remote) presents to ER with cc of 24 hrs of ABD pain and n/v.   No f/c, no CP, Cough, f/ud, vd, vb, or other symptoms.   Pt reports normal BM.  Last BM yesterday.  Normal.  No sick contacts, no suspicious food intake.      Patient is a 48 y.o. female presenting with abdominal pain. The history is provided by the patient.  Abdominal Pain Pain location:  Epigastric Pain quality: aching   Pain quality: not bloating, not burning, not cramping, not dull, no fullness, not gnawing, not heavy, no pressure, not sharp, not shooting, not squeezing, not stabbing, no stiffness, not tearing, not throbbing and not tugging   Pain radiates to:  Does not radiate Pain severity:  Severe Onset quality:  Gradual Duration:  1 day Timing:  Constant Progression:  Worsening Chronicity:  New Context: not alcohol use, not awakening from sleep, not diet changes, not eating, not laxative use, not medication withdrawal, not previous surgeries, not recent illness, not recent sexual activity, not recent travel, not retching, not sick contacts, not suspicious food intake and not trauma   Relieved by:  Nothing Worsened by:  Movement Ineffective treatments:  NSAIDs Associated symptoms: nausea and vomiting   Associated symptoms: no anorexia, no belching, no chest pain, no chills, no constipation, no cough, no diarrhea, no dysuria, no fatigue, no fever, no flatus, no hematemesis, no hematochezia, no hematuria, no melena, no shortness of breath, no sore throat, no vaginal bleeding and no vaginal discharge     Past Medical History  Diagnosis Date  . Obesity    Past  Surgical History  Procedure Laterality Date  . Abdominal hysterectomy    . Cholecystectomy     No family history on file. History  Substance Use Topics  . Smoking status: Never Smoker   . Smokeless tobacco: Not on file  . Alcohol Use: No   OB History   Grav Para Term Preterm Abortions TAB SAB Ect Mult Living                 Review of Systems  Constitutional: Negative for fever, chills and fatigue.  HENT: Negative.  Negative for sore throat.   Eyes: Negative.   Respiratory: Negative for cough and shortness of breath.   Cardiovascular: Negative for chest pain.  Gastrointestinal: Positive for nausea, vomiting and abdominal pain. Negative for diarrhea, constipation, blood in stool, melena, hematochezia, anal bleeding, rectal pain, anorexia, flatus and hematemesis.  Endocrine: Negative.   Genitourinary: Negative for dysuria, frequency, hematuria, flank pain, vaginal bleeding, vaginal discharge, enuresis, difficulty urinating, genital sores, menstrual problem, pelvic pain and dyspareunia.  Musculoskeletal: Negative.   Skin: Negative.   Allergic/Immunologic: Negative.   Neurological: Negative.       Allergies  Review of patient's allergies indicates no known allergies.  Home Medications   Current Outpatient Rx  Name  Route  Sig  Dispense  Refill  . Multiple Vitamin (MULTIVITAMIN WITH MINERALS) TABS   Oral   Take 1 tablet by mouth daily.         . Naproxen Sodium (ALEVE PO)   Oral   Take 2 tablets by mouth every  6 (six) hours as needed (pain).          BP 131/69  Pulse 75  Temp(Src) 98.2 F (36.8 C) (Oral)  Resp 22  Ht 5\' 4"  (1.626 m)  Wt 216 lb (97.977 kg)  BMI 37.06 kg/m2  SpO2 98% Physical Exam  Nursing note and vitals reviewed. Constitutional: She appears well-developed and well-nourished.  HENT:  Head: Normocephalic.  Mouth/Throat: Oropharynx is clear and moist.  Eyes: Conjunctivae are normal. Right eye exhibits no discharge. Left eye exhibits no  discharge.  Neck: Normal range of motion. No JVD present. No tracheal deviation present.  Cardiovascular: Normal rate and regular rhythm.   No murmur heard. Pulmonary/Chest: Effort normal and breath sounds normal. No stridor. No respiratory distress. She has no wheezes. She has no rales.  Abdominal: Soft. Bowel sounds are normal. She exhibits no distension. There is no hepatosplenomegaly. There is tenderness. There is no rigidity, no rebound, no guarding, no CVA tenderness, no tenderness at McBurney's point and negative Murphy's sign. No hernia.    Musculoskeletal: Normal range of motion. She exhibits no edema and no tenderness.  Neurological: She is alert. She exhibits normal muscle tone.  Skin: She is not diaphoretic.  No pelvic exam as pt is w/o vaginal symptoms.     ED Course  Procedures (including critical care time) Labs Review Labs Reviewed  CBC WITH DIFFERENTIAL - Abnormal; Notable for the following:    MCH 25.7 (*)    RDW 15.9 (*)    Neutrophils Relative % 42 (*)    Eosinophils Relative 6 (*)    All other components within normal limits  COMPREHENSIVE METABOLIC PANEL - Abnormal; Notable for the following:    Glucose, Bld 112 (*)    All other components within normal limits  URINALYSIS, ROUTINE W REFLEX MICROSCOPIC   Imaging Review No results found.   EKG Interpretation None      MDM   Final diagnoses:  Ileitis  Abdominal pain  UTI   48 yo AA female with cc of n/v and abdominal pain.  Pt with h/o appy, chole, TAH.    EDC:   Pt found to have UTI and ileitis.  AFVSS.  WBC 4.4.  Written for Flagyl and Levaquin.  Pain significant and required Morphine x 2 and Dilaudid.    I spoke with Internal medicine teaching service and pt is to be admitted for bowel rest and further evaluation.  Pt was updated and agrees with plan.    7:53 AM Pt t/o to Dr. Gwendolyn GrantWalden pending admission.      Darlys Galesavid Ahnesty Finfrock, MD 10/07/13 [phone]

## 2013-10-07 NOTE — H&P (Addendum)
INTERNAL MEDICINE TEACHING ATTENDING NOTE  Day 0 of stay  Patient name: Connie Mcneil  MRN: [id number] Date of birth: 03/15/1966   48 y.o.female admitted with acute abdomen. Her past medical history is consistent with multiple surgeries, and ileitis. However she reports that the pain she is having currently is 10/10 intensity and she has never had such an episode before.  Pain is - centrally located, started suddenly, has no radiation, present all the time, is not relieved by lying still, she was not able to describe the quality of the pain. Accompanying symptoms: Nausea - no vomiting since admission. Diarrhea 2 weeks back.  Pertinent negatives - no fever, chills, chest pain, dizziness, abdominal distension, blood in stool, pain radiating to the back.   Filed Vitals:   10/07/13 0745 10/07/13 0800 10/07/13 0815 10/07/13 0843  BP: 100/60 101/55 106/59 100/63  Pulse: 61 67 69 76  Temp:    97.9 F (36.6 C)  TempSrc:    Oral  Resp: 25 18 21 20   Height:    5\' 4"  (1.626 m)  Weight:    269 lb 4.8 oz (122.154 kg)  SpO2: 100% 100% 100% 100%    Upon evaluation- She is an obese woman in visible distress. She complains of 10/10 pain. Her abdomen is soft, non-distended, no discoloration, tender to palpation diffusely but most prominently in the center and epigastric region. There appears to be no hepatomegaly. There is mild guarding, no rebound tenderness. She is not tachycardic, and heart sounds are normal. Lungs were not examined as the patient found sitting up very painful. There is no significant pedal edema.    I have reviewed the labs and images.  I agree with the management plan per Dr Yetta BarreJones, with these additions:   Acute Abdomen with CT demonstrated ileitis - The pain looks out of proportion to the state CT findings to me. Acute mesenteric ischemia (SMA occlusion) is strong consideration - given the sudden, periumblical, severe nature of the pain accompanied by nausea and vomiting. SMA occlusion  can have mild abdominal exam initially with no or minimal lactic acid accumalation.   Serial abdominal exams to be done.   Lactic Acid serial checks  D-Dimer levels to exclude ischemia.   Surgical consult will be obtained by my resident team.   We will consider mesenteric angiography if the patient's condition does not improve.  The other possibility for this patient is inflammatory bowel disease, possibly Crohn's. However, she does not give a history consistent with that.   I agree with the current antibiotic cover.   GI consult will be obtained by my resident team.  I have seen and evaluated this patient and discussed it with my IM resident team.  Please see the rest of the plan per resident note from today.   Ainsley Deakins 10/07/2013, 2:22 PM.

## 2013-10-07 NOTE — H&P (Signed)
Date: 10/07/2013               Patient Name:  Connie Mcneil MRN: [id number]  DOB: 1966-02-12 Age / Sex: 48 y.o., female   PCP: Provider Default, MD         Medical Service: Internal Medicine Teaching Service         Attending Physician: Dr. Madilyn Fireman, MD    First Contact: Dr. Ronnald Ramp Pager: [phone]  Second Contact: Dr. Eula Fried Pager: [phone]       After Hours (After 5p/  First Contact Pager: [phone]  weekends / holidays): Second Contact Pager: [phone]   Chief Complaint: Abdominal pain, nausea, vomiting  History of Present Illness: Connie Mcneil is a 48 y.o. female w/ PMHx of obesity, h/o ileitis, s/p cholecystectomy and appendectomy, presents to the ED w/ complaints of abdominal pain, nausea, and vomiting. Patient claims symptoms started yesterday w/ abdominal pain that she claims started as a nagging pain, centrally located, which became progressively worse. Last night she tried to eat some food to see if it helped with the pain, but she claims her pain remained unchanged, 10/10 in severity. She also took some Aleve which she says may have made the pain worse. Last night, after she ate, she says that she had an episode of vomiting (NB/NB). She claims she has never had this sort of pain before. She otherwise denies fever, chills, diarrhea, recent sick contact or travel history. She also denies SOB, chest pain, dizziness, lightheadedness, or palpitations. She also denies any recent dysuria, hematuria, increased frequency, or urgency. The patient claims she had some loose stools last week, but otherwise denies diarrhea.  Patient has a h/o previous cholecystectomy, appendectomy, and hysterectomy, all dating back to the 1990's. She denies any h/o smoking, alcohol, or drug abuse.   Meds: Current Facility-Administered Medications  Medication Dose Route Frequency Provider Last Rate Last Dose  . ciprofloxacin (CIPRO) IVPB 400 mg  400 mg Intravenous Q12H Corky Sox, MD      . dextrose  5 %-0.9 % sodium chloride infusion   Intravenous Continuous Corky Sox, MD      . enoxaparin (LOVENOX) injection 40 mg  40 mg Subcutaneous Daily Corky Sox, MD      . HYDROmorphone (DILAUDID) injection 1 mg  1 mg Intravenous Q2H PRN Corky Sox, MD      . metroNIDAZOLE (FLAGYL) IVPB 500 mg  500 mg Intravenous Q8H Corky Sox, MD      . ondansetron [facility]) tablet 4 mg  4 mg Oral Q6H PRN Corky Sox, MD       Or  . ondansetron [facility]) injection 4 mg  4 mg Intravenous Q6H PRN Corky Sox, MD       Medications Prior to Admission  Medication Sig Dispense Refill  . Multiple Vitamin (MULTIVITAMIN WITH MINERALS) TABS Take 1 tablet by mouth daily.      . Naproxen Sodium (ALEVE PO) Take 2 tablets by mouth every 6 (six) hours as needed (pain).       Allergies: Allergies as of 10/06/2013  . (No Known Allergies)   Past Medical History  Diagnosis Date  . Obesity    Past Surgical History  Procedure Laterality Date  . Abdominal hysterectomy    . Cholecystectomy     No family history on file. History   Social History  . Marital Status: Married    Spouse Name: N/A    Number of Children: N/A  .  Years of Education: N/A   Occupational History  . Not on file.   Social History Main Topics  . Smoking status: Never Smoker   . Smokeless tobacco: Not on file  . Alcohol Use: No  . Drug Use: No  . Sexual Activity: Not on file   Other Topics Concern  . Not on file   Social History Narrative  . No narrative on file   Review of Systems: General: Denies fever, chills, diaphoresis, appetite change and fatigue.  Respiratory: Denies SOB, DOE, cough, chest tightness, and wheezing.   Cardiovascular: Denies chest pain and palpitations.  Gastrointestinal: Positive for nausea, vomiting, and abdominal pain. Denies diarrhea, constipation, blood in stool and abdominal distention.  Genitourinary: Denies dysuria, urgency, frequency, hematuria, and flank pain. Endocrine: Denies hot or cold  intolerance, polyuria, and polydipsia. Musculoskeletal: Positive for left knee pain. Denies myalgias, back pain, joint swelling, and gait problem.  Skin: Denies pallor, rash and wounds.  Neurological: Denies dizziness, seizures, syncope, weakness, lightheadedness, numbness and headaches.  Psychiatric/Behavioral: Denies mood changes, confusion, nervousness, sleep disturbance and agitation.  Physical Exam: Filed Vitals:   10/07/13 0745 10/07/13 0800 10/07/13 0815 10/07/13 0843  BP: 100/60 101/55 106/59 100/63  Pulse: 61 67 69 76  Temp:    97.9 F (36.6 C)  TempSrc:    Oral  Resp: _0 Height:    _1  (1.626 m)  Weight:    269 lb 4.8 oz (122.154 kg)  SpO2: 100% 100% 100% 100%  General: Vital signs reviewed.  Patient is a well-developed and well-nourished, in no acute distress and cooperative with exam.  Head: Normocephalic and atraumatic. Eyes: PERRL, EOMI, conjunctivae normal, No scleral icterus.  Neck: Supple, trachea midline, normal ROM, No JVD, masses, thyromegaly, or carotid bruit present.  Cardiovascular: RRR, S1 normal, S2 normal, no murmurs, gallops, or rubs. Pulmonary/Chest: Air entry equal bilaterally, no wheezes, rales, or rhonchi. Abdominal: Soft, significantly tender to palpation diffusely, mostly central/epigastric in nature, non-distended, BS +, no masses or organomegaly. Mild rebound and guarding present.   Musculoskeletal: No joint deformities, erythema, or stiffness, ROM full and nontender. Extremities: Trace pitting edema,  pulses symmetric and intact bilaterally. No cyanosis or clubbing. Neurological: A&O x3, Strength is normal and symmetric bilaterally, cranial nerve II-XII are grossly intact, no focal motor deficit, sensory intact to light touch bilaterally.  Skin: Warm, dry and intact. No rashes or erythema. Psychiatric: Normal mood and affect. speech and behavior is normal. Cognition and memory are normal.   Lab results: Basic Metabolic Panel:  Recent  Labs  10/07/13 0030  NA 139  K 3.9  CL 104  CO2 23  GLUCOSE 112*  BUN 11  CREATININE 0.75  CALCIUM 9.1   Liver Function Tests:  Recent Labs  10/07/13 0030  AST 16  ALT 16  ALKPHOS 75  BILITOT 0.3  PROT 7.1  ALBUMIN 3.5    Recent Labs  10/07/13 0509  LIPASE 21   CBC:  Recent Labs  10/07/13 0030  WBC 4.4  NEUTROABS 1.9  HGB 12.3  HCT 37.3  MCV 78.0  PLT 310   Urinalysis:  Recent Labs  10/07/13 0406  COLORURINE YELLOW  LABSPEC 1.025  PHURINE 6.0  GLUCOSEU NEGATIVE  HGBUR SMALL*  BILIRUBINUR NEGATIVE  KETONESUR 15*  PROTEINUR NEGATIVE  UROBILINOGEN 0.2  NITRITE POSITIVE*  LEUKOCYTESUR LARGE*   Imaging results:  Ct Abdomen Pelvis W Contrast  10/07/2013   CLINICAL DATA:  Lower abdominal cramping, nausea and vomiting.  EXAM: CT ABDOMEN AND PELVIS WITH CONTRAST  TECHNIQUE: Multidetector CT imaging of the abdomen and pelvis was performed using the standard protocol following bolus administration of intravenous contrast.  CONTRAST:  2m OMNIPAQUE IOHEXOL 300 MG/ML  SOLN  COMPARISON:  CT of the abdomen and pelvis performed 07/04/2012  FINDINGS: The visualized lung bases are clear. Mild left lower lobe bronchiectasis is noted, with mild associated atelectasis.  The liver and spleen are unremarkable in appearance. The patient is status post cholecystectomy, with clips noted at the gallbladder fossa. The pancreas and adrenal glands are unremarkable.  Innumerable scattered bilateral renal cysts are seen. A 3 mm stone is noted at the lower pole of the left kidney. The kidneys are otherwise unremarkable. There is no evidence of hydronephrosis. No obstructing ureteral stones are seen. No perinephric stranding is appreciated.  A relatively long segment of wall thickening is noted along the mid to distal ileum, without evidence for obstruction. Mild associated soft tissue inflammation is seen. Findings are concerning for infectious or inflammatory ileitis.  The stomach is  within normal limits. No acute vascular abnormalities are seen.  The appendix is not definitely seen; there is no evidence of appendicitis. The colon is unremarkable in appearance.  The bladder is mildly distended and grossly unremarkable. No inguinal lymphadenopathy is seen.  No acute osseous abnormalities are identified.  IMPRESSION: 1. Relatively long segment of wall thickening along the mid to distal ileum, without evidence for obstruction. Mild associated soft tissue inflammation seen. Findings concerning for infectious or inflammatory ileitis. The more distal ileum is decompressed and grossly unremarkable. 2. Innumerable scattered bilateral renal cysts seen. 3 mm nonobstructing stone noted at the lower pole of the left kidney. 3. Mild left lower lobe bronchiectasis noted, with mild associated atelectasis.   Electronically Signed   By: JGarald BaldingM.D.   On: 10/07/2013 05:48   Other results: EKG: none  Assessment & Plan by Problem: Ms. Connie Grittonis a 48y.o. female w/ PMHx of obesity, h/o ileitis, s/p cholecystectomy and appendectomy, admitted for suspected ileitis and UTI.  Ileitis- Patient w/ nausea, vomiting, and significant abdominal pain since yesterday. Denies sick contacts or possibility of food-borne illness, or recent travel. Claims she has never had this before, however, ED note from 07/04/2012 shows visit for abdominal pain, discharged home at that time. Pain very severe during this admission, given Morphine 4 mg x2 + Dilaudid 1 mg in ED. CT abdomen showed a long segment of wall thickening along the mid to distal ileum, without evidence for obstruction and mild associated soft tissue inflammation. These findings are suggestive of infectious vs inflammatory ileitis. BMP showed normal electrolytes, no significant AG (12). Do not suspect ischemic colitis given absence of AG. Lipase 21 on admission, no h/o alcohol abuse and patient s/p cholecystectomy several years ago, therefore  pancreatitis unlikely. Given Levaquin + Flagyl in ED. -Admit to med-surg -Continue Cipro + Flagyl IV  -NPO for now -Dilaudid 1 mg q2h prn -IVF; D5NS @ 100 ml/hr for 10 hours -Lactic acid pending -EKG pending -Blood cultures pending -ESR + CRP pending  UTI- Patient denies h/o dysuria, hematuria, increased frequency, urgency, or flank pain, however, given severity of abdominal pain, may also be related. [facility] ED shows nitrites, large leukocytes, few squames, many bacteria, and 15 ketones. No leukocytosis. Patient also shown to have calcium oxalate crystals + CT abdomen shows 3 mm stone noted at the lower pole of the left kidney. -Urine culture pending (has been positive for  E. Coli in the past) -Cipro + Flagyl as above  DVT/PE PPx- Lovenox [place]  Dispo: Disposition is deferred at this time, awaiting improvement of current medical problems. Anticipated discharge in approximately 1-2 day(s).   The patient does not have a current PCP (Provider Default, MD) and does not need an [facility] hospital follow-up appointment after discharge.  The patient does not have transportation limitations that hinder transportation to clinic appointments.  Signed: Corky Sox, MD 10/07/2013, 9:35 AM

## 2013-10-08 DIAGNOSIS — K5289 Other specified noninfective gastroenteritis and colitis: Secondary | ICD-10-CM

## 2013-10-08 DIAGNOSIS — R110 Nausea: Secondary | ICD-10-CM

## 2013-10-08 DIAGNOSIS — R109 Unspecified abdominal pain: Secondary | ICD-10-CM

## 2013-10-08 LAB — BASIC METABOLIC PANEL
BUN: 9 mg/dL (ref 6–23)
CO2: 23 mEq/L (ref 19–32)
Calcium: 8.7 mg/dL (ref 8.4–10.5)
Chloride: 107 mEq/L (ref 96–112)
Creatinine, Ser: 0.79 mg/dL (ref 0.50–1.10)
GFR calc Af Amer: >90 mL/min (ref >90)
GFR calc non Af Amer: >90 mL/min (ref >90)
Glucose, Bld: 109 mg/dL — ABNORMAL HIGH (ref 70–99)
Potassium: 4.4 mEq/L (ref 3.7–5.3)
Sodium: 141 mEq/L (ref 137–147)

## 2013-10-08 LAB — LACTIC ACID, PLASMA
Lactic Acid, Venous: 0.7 mmol/L (ref 0.5–2.2)
Lactic Acid, Venous: 0.6 mmol/L (ref 0.5–2.2)

## 2013-10-08 MED ORDER — HYDROMORPHONE HCL PF 1 MG/ML IJ SOLN
1.0000 mg | INTRAMUSCULAR | Status: DC | PRN
  Administered 2013-10-08 (×5): 1 mg via INTRAVENOUS
  Filled 2013-10-08 (×5): qty 1

## 2013-10-08 MED ORDER — HYDROMORPHONE HCL PF 1 MG/ML IJ SOLN
2.0000 mg | INTRAMUSCULAR | Status: DC | PRN
  Administered 2013-10-08: 1 mg via INTRAVENOUS
  Administered 2013-10-09 (×3): 2 mg via INTRAVENOUS
  Filled 2013-10-08 (×5): qty 2

## 2013-10-08 MED ORDER — DEXTROSE-NACL 5-0.9 % IV SOLN
INTRAVENOUS | Status: AC
  Administered 2013-10-08: 10:00:00 via INTRAVENOUS

## 2013-10-08 MED ORDER — ONDANSETRON HCL 4 MG/2ML IJ SOLN: 4.0000 mg | Freq: Four times a day (QID) | INTRAMUSCULAR | Status: DC | PRN

## 2013-10-08 MED ORDER — HYDROMORPHONE HCL PF 1 MG/ML IJ SOLN
1.0000 mg | INTRAMUSCULAR | Status: DC | PRN
  Administered 2013-10-08: 1 mg via INTRAVENOUS
  Filled 2013-10-08: qty 1

## 2013-10-08 MED ORDER — ONDANSETRON HCL 4 MG/2ML IJ SOLN
4.0000 mg | Freq: Three times a day (TID) | INTRAMUSCULAR | Status: DC
  Administered 2013-10-08 (×2): 4 mg via INTRAVENOUS
  Filled 2013-10-08 (×2): qty 2

## 2013-10-08 MED ORDER — ONDANSETRON HCL 4 MG/2ML IJ SOLN
4.0000 mg | Freq: Three times a day (TID) | INTRAMUSCULAR | Status: AC
Start: 2013-10-08 — End: 2013-10-10
  Administered 2013-10-09 – 2013-10-10 (×5): 4 mg via INTRAVENOUS
  Filled 2013-10-08 (×5): qty 2

## 2013-10-08 NOTE — Progress Notes (Signed)
Subjective: Temp reported at 99, but I think she feels warmer.  Abdomen is diffusely tender, and she feels really bad. Last BM 4/1.  Objective: Vital signs in last 24 hours: Temp:  [98.9 F (37.2 C)-99.8 F (37.7 C)] 99.8 F (37.7 C) (04/04 1014) Pulse Rate:  [74-89] 89 (04/04 1014) Resp:  [20] 20 (04/04 1014) BP: (101-151)/(52-89) 101/59 mmHg (04/04 1014) SpO2:  [91 %-100 %] 91 % (04/04 1014) Last BM Date: 10/06/13  Intake/Output from previous day:   Intake/Output this shift:    General appearance: alert, mild distress and she clearly feels bad. GI: soft very tender all over.  ? BS.  Lab Results:   Recent Labs  10/07/13 0030  WBC 4.4  HGB 12.3  HCT 37.3  PLT 310    BMET  Recent Labs  10/07/13 0030 10/08/13 0746  NA 139 141  K 3.9 4.4  CL 104 107  CO2 23 23  GLUCOSE 112* 109*  BUN 11 9  CREATININE 0.75 0.79  CALCIUM 9.1 8.7   PT/INR No results found for this basename: LABPROT, INR,  in the last 72 hours   Recent Labs Lab 10/07/13 0030  AST 16  ALT 16  ALKPHOS 75  BILITOT 0.3  PROT 7.1  ALBUMIN 3.5     Lipase     Component Value Date/Time   LIPASE 21 10/07/2013 0509     Studies/Results: Ct Abdomen Pelvis W Contrast  10/07/2013   CLINICAL DATA:  Lower abdominal cramping, nausea and vomiting.  EXAM: CT ABDOMEN AND PELVIS WITH CONTRAST  TECHNIQUE: Multidetector CT imaging of the abdomen and pelvis was performed using the standard protocol following bolus administration of intravenous contrast.  CONTRAST:  80mL OMNIPAQUE IOHEXOL 300 MG/ML  SOLN  COMPARISON:  CT of the abdomen and pelvis performed 07/04/2012  FINDINGS: The visualized lung bases are clear. Mild left lower lobe bronchiectasis is noted, with mild associated atelectasis.  The liver and spleen are unremarkable in appearance. The patient is status post cholecystectomy, with clips noted at the gallbladder fossa. The pancreas and adrenal glands are unremarkable.  Innumerable scattered  bilateral renal cysts are seen. A 3 mm stone is noted at the lower pole of the left kidney. The kidneys are otherwise unremarkable. There is no evidence of hydronephrosis. No obstructing ureteral stones are seen. No perinephric stranding is appreciated.  A relatively long segment of wall thickening is noted along the mid to distal ileum, without evidence for obstruction. Mild associated soft tissue inflammation is seen. Findings are concerning for infectious or inflammatory ileitis.  The stomach is within normal limits. No acute vascular abnormalities are seen.  The appendix is not definitely seen; there is no evidence of appendicitis. The colon is unremarkable in appearance.  The bladder is mildly distended and grossly unremarkable. No inguinal lymphadenopathy is seen.  No acute osseous abnormalities are identified.  IMPRESSION: 1. Relatively long segment of wall thickening along the mid to distal ileum, without evidence for obstruction. Mild associated soft tissue inflammation seen. Findings concerning for infectious or inflammatory ileitis. The more distal ileum is decompressed and grossly unremarkable. 2. Innumerable scattered bilateral renal cysts seen. 3 mm nonobstructing stone noted at the lower pole of the left kidney. 3. Mild left lower lobe bronchiectasis noted, with mild associated atelectasis.   Electronically Signed   By: Roanna Raider M.D.   On: 10/07/2013 05:48    Medications: . ciprofloxacin  400 mg Intravenous Q12H  . enoxaparin (LOVENOX) injection  40 mg Subcutaneous  Daily  . metronidazole  500 mg Intravenous Q8H   . dextrose 5 % and 0.9% NaCl 75 mL/hr at 10/08/13 1026   Prior to Admission medications   Medication Sig Start Date End Date Taking? Authorizing Provider  Multiple Vitamin (MULTIVITAMIN WITH MINERALS) TABS Take 1 tablet by mouth daily.   Yes Historical Provider, MD  Naproxen Sodium (ALEVE PO) Take 2 tablets by mouth every 6 (six) hours as needed (pain).   Yes Historical  Provider, MD      Assessment/Plan Nausea and vomiting with abdominal pain, diarrhea 2 weeks ago. Ileitis of uncertain etiology Prior hx of hysterectomy, appendectomy and cholecystectomy Body mass index is 46.2 kg/(m^2).  Normal labs, CT shows:  Relatively long segment of wall thickening along the mid to distal ileum, without evidence for obstruction. Mild associated soft tissue inflammation seen. Findings concerning for infectious or inflammatory ileitis. The more distal ileum is decompressed.   Plan :  We will follow with you.  LOS: 1 day    Connie Mcneil 10/08/2013

## 2013-10-08 NOTE — Consult Note (Signed)
Consultation (we are covering unassigned service for Drs. Hung/Mann this weekend)  Referring Provider:  [facility]    Primary Care Physician:  Default, Provider, MD Primary Gastroenterologist:  Althia Forts      Reason for Consultation: ileitis              HPI:   Connie Mcneil is a 48 y.o. female with no significant PMH who presented to ED yesterday with abdominal pain , nausea and vomiting. Her labs were fairly unremarkable with normal WBC and ESR. Urinalysis with + nitrites. CTscan reveals long segment of wall thickening along the mid to distal ileum, without evidence for obstruction. Patient had a few days of non-bloody diarrhea 2 weeks ago, unusual for her. Then, on Thursday the nausea, vomiting and diffuse abdominal pain began. Nausea frequent but vomits mainly with any PO. She has a low grade temp today but was afebrile at home. Prior to diarrhea 2 weeks ago she felt fine. No recent travel. No known sick contacts. She took Aleve for abdominal pain a few days ago but no prior use of NSAIDS on regular basis. Only takes ASA at home. Weight is stable. No [place] of IBD.  Past Medical History  Diagnosis Date  . Obesity     Past Surgical History  Procedure Laterality Date  . Abdominal hysterectomy    . Cholecystectomy    . Appendectomy     FM[facility]: No IBD, No colon cancer   History  Substance Use Topics  . Smoking status: Never Smoker   . Smokeless tobacco: Not on file  . Alcohol Use: No    Prior to Admission medications   Medication Sig Start Date End Date Taking? Authorizing Provider  Multiple Vitamin (MULTIVITAMIN WITH MINERALS) TABS Take 1 tablet by mouth daily.   Yes Historical Provider, MD  Naproxen Sodium (ALEVE PO) Take 2 tablets by mouth every 6 (six) hours as needed (pain).   Yes Historical Provider, MD    Current Facility-Administered Medications  Medication Dose Route Frequency Provider Last Rate Last Dose  . ciprofloxacin (CIPRO) IVPB 400 mg  400 mg  Intravenous Q12H Corky Sox, MD   400 mg at 10/07/13 2238  . enoxaparin (LOVENOX) injection 40 mg  40 mg Subcutaneous Daily Corky Sox, MD   40 mg at 10/07/13 1235  . HYDROmorphone (DILAUDID) injection 1 mg  1 mg Intravenous Q4H PRN Corky Sox, MD   1 mg at 10/08/13 0907  . metroNIDAZOLE (FLAGYL) IVPB 500 mg  500 mg Intravenous Q8H Corky Sox, MD   500 mg at 10/08/13 0245  . promethazine (PHENERGAN) injection 6.25 mg  6.25 mg Intravenous Q6H PRN Jerene Pitch, MD   6.25 mg at 10/08/13 0835    Allergies as of 10/06/2013  . (No Known Allergies)    Review of Systems:    All systems reviewed and negative except where noted in HPI.   Physical Exam:  Vital signs in last 24 hours: Temp:  [98.9 F (37.2 C)-99.6 F (37.6 C)] 99.6 F (37.6 C) (04/04 0547) Pulse Rate:  [74-84] 84 (04/04 0547) Resp:  [20] 20 (04/04 0547) BP: (113-151)/(52-89) 151/89 mmHg (04/04 0547) SpO2:  [100 %] 100 % (04/04 0547) Last BM Date: 10/06/13 General:   Pleasant 48 year old black female in NAD Head:  Normocephalic and atraumatic. Eyes:   No icterus.   Conjunctiva pink. Ears:  Normal auditory acuity. Neck:  Supple; no masses felt Lungs:  Respirations even and unlabored. Lungs  clear to auscultation bilaterally.   No wheezes, crackles, or rhonchi.  Heart:  Regular rate and rhythm Abdomen:  Soft, obese, nondistended. She is diffusely tender, but more so in upper abdomen. Hypoactive bowel sounds. No appreciable masses  Rectal:  Not performed.  Msk:  Symmetrical without gross deformities.  Extremities:  Without edema. Neurologic:  Alert and  oriented x4;  grossly normal neurologically. Skin:  Intact without significant lesions or rashes. Cervical Nodes:  No significant cervical adenopathy. Psych:  Alert and cooperative. Normal affect.  LAB RESULTS:  Recent Labs  10/07/13 0030  WBC 4.4  HGB 12.3  HCT 37.3  PLT 310   BMET  Recent Labs  10/07/13 0030 10/08/13 0746  NA 139 141  K 3.9 4.4   CL 104 107  CO2 23 23  GLUCOSE 112* 109*  BUN 11 9  CREATININE 0.75 0.79  CALCIUM 9.1 8.7   LFT  Recent Labs  10/07/13 0030  PROT 7.1  ALBUMIN 3.5  AST 16  ALT 16  ALKPHOS 75  BILITOT 0.3   STUDIES: Ct Abdomen Pelvis W Contrast  10/07/2013   CLINICAL DATA:  Lower abdominal cramping, nausea and vomiting.  EXAM: CT ABDOMEN AND PELVIS WITH CONTRAST  TECHNIQUE: Multidetector CT imaging of the abdomen and pelvis was performed using the standard protocol following bolus administration of intravenous contrast.  CONTRAST:  16m OMNIPAQUE IOHEXOL 300 MG/ML  SOLN  COMPARISON:  CT of the abdomen and pelvis performed 07/04/2012  FINDINGS: The visualized lung bases are clear. Mild left lower lobe bronchiectasis is noted, with mild associated atelectasis.  The liver and spleen are unremarkable in appearance. The patient is status post cholecystectomy, with clips noted at the gallbladder fossa. The pancreas and adrenal glands are unremarkable.  Innumerable scattered bilateral renal cysts are seen. A 3 mm stone is noted at the lower pole of the left kidney. The kidneys are otherwise unremarkable. There is no evidence of hydronephrosis. No obstructing ureteral stones are seen. No perinephric stranding is appreciated.  A relatively long segment of wall thickening is noted along the mid to distal ileum, without evidence for obstruction. Mild associated soft tissue inflammation is seen. Findings are concerning for infectious or inflammatory ileitis.  The stomach is within normal limits. No acute vascular abnormalities are seen.  The appendix is not definitely seen; there is no evidence of appendicitis. The colon is unremarkable in appearance.  The bladder is mildly distended and grossly unremarkable. No inguinal lymphadenopathy is seen.  No acute osseous abnormalities are identified.  IMPRESSION: 1. Relatively long segment of wall thickening along the mid to distal ileum, without evidence for obstruction. Mild  associated soft tissue inflammation seen. Findings concerning for infectious or inflammatory ileitis. The more distal ileum is decompressed and grossly unremarkable. 2. Innumerable scattered bilateral renal cysts seen. 3 mm nonobstructing stone noted at the lower pole of the left kidney. 3. Mild left lower lobe bronchiectasis noted, with mild associated atelectasis.   Electronically Signed   By: JGarald BaldingM.D.   On: 10/07/2013 05:48   PREVIOUS ENDOSCOPIES:            none   Impression / Plan:   159 48year old female with acute nausea/vomiting and diffuse abdominal pain preceded by diarrheal illness two weeks ago. CTscan with contrast reveals relatively long non-obstructing segment of wall thickening along the mid to distal ileum with mild associated soft tissue inflammation. Doubt IBD given such acute onset. Doubt ischemic. No significant NSAID use. Suspect  recent infectious enteritis (?yersinia) especially given diarrheal illness two weeks prior. Her labs are reassuring. She is quite tender on exam, especially in upper abdomen but that could be secondary to vomiting. Agree with cipro / flagyl, anti-emetics, supportive care for now.   2. Status post remote appendectomy, cholecystectomy and hysterectomy.   Thanks   LOS: 1 day   Tye Savoy  10/08/2013, 9:18 AM   ________________________________________________________________________  Vel[facility] Heckler GI MD note:  I personally examined the patient, reviewed the data and agree with the assessment and plan described above.  Probably acute infection, less likely ischemic/IBD/other.  Should continue supportive care (with iv abx, iv fluids, pain meds for now).  She is not obstructed, will offer clears.  Will start scheduled anti-emetics (zofran 14m iv every 8 hours for 2 days).   DOwens Loffler MD LHigh [facility]oscopy[facility] Pager [phone]

## 2013-10-08 NOTE — Progress Notes (Signed)
Subjective: Patient seen at bedside this AM. Still with significant abdominal pain, mostly central. No rebound or guarding. Bowel sound hypoactive. No vomiting.  Objective: Vital signs in last 24 hours: Filed Vitals:   10/07/13 0815 10/07/13 0843 10/07/13 2144 10/08/13 0547  BP: 106/59 100/63 113/52 151/89  Pulse: 69 76 74 84  Temp:  97.9 F (36.6 C) 98.9 F (37.2 C) 99.6 F (37.6 C)  TempSrc:  Oral Oral Oral  Resp: _0 Height:  5' 4" (1.626 m)    Weight:  269 lb 4.8 oz (122.154 kg)    SpO2: 100% 100% 100% 100%   Weight change: 53 lb 4.8 oz (24.177 kg) No intake or output data in the 24 hours ending 10/08/13 1002  Physical Exam: General: Alert, cooperative, NAD. HEENT: PERRL, EOMI. Moist mucus membranes Neck: Full range of motion without pain, supple, no lymphadenopathy or carotid bruits Lungs: Clear to ascultation bilaterally, normal work of respiration, no wheezes, rales, rhonchi Heart: RRR, no murmurs, gallops, or rubs Abdomen: Soft, tender diffusely, non-distended, BS hypoactive. No rebound or guarding. Extremities: No cyanosis, clubbing, or edema Neurologic: Alert & oriented X3, cranial nerves II-XII intact, strength grossly intact, sensation intact to light touch  Lab Results: Basic Metabolic Panel:  Recent Labs Lab 10/07/13 0030 10/08/13 0746  NA 139 141  K 3.9 4.4  CL 104 107  CO2 23 23  GLUCOSE 112* 109*  BUN 11 9  CREATININE 0.75 0.79  CALCIUM 9.1 8.7   Liver Function Tests:  Recent Labs Lab 10/07/13 0030  AST 16  ALT 16  ALKPHOS 75  BILITOT 0.3  PROT 7.1  ALBUMIN 3.5    Recent Labs Lab 10/07/13 0509  LIPASE 21   CBC:  Recent Labs Lab 10/07/13 0030  WBC 4.4  NEUTROABS 1.9  HGB 12.3  HCT 37.3  MCV 78.0  PLT 310   Urinalysis:  Recent Labs Lab 10/07/13 0406  COLORURINE YELLOW  LABSPEC 1.025  PHURINE 6.0  GLUCOSEU NEGATIVE  HGBUR SMALL*  BILIRUBINUR NEGATIVE  KETONESUR 15*  PROTEINUR NEGATIVE  UROBILINOGEN  0.2  NITRITE POSITIVE*  LEUKOCYTESUR LARGE*   Micro Results: No results found for this or any previous visit (from the past 240 hour(s)). Studies/Results: Ct Abdomen Pelvis W Contrast  10/07/2013   CLINICAL DATA:  Lower abdominal cramping, nausea and vomiting.  EXAM: CT ABDOMEN AND PELVIS WITH CONTRAST  TECHNIQUE: Multidetector CT imaging of the abdomen and pelvis was performed using the standard protocol following bolus administration of intravenous contrast.  CONTRAST:  10m OMNIPAQUE IOHEXOL 300 MG/ML  SOLN  COMPARISON:  CT of the abdomen and pelvis performed 07/04/2012  FINDINGS: The visualized lung bases are clear. Mild left lower lobe bronchiectasis is noted, with mild associated atelectasis.  The liver and spleen are unremarkable in appearance. The patient is status post cholecystectomy, with clips noted at the gallbladder fossa. The pancreas and adrenal glands are unremarkable.  Innumerable scattered bilateral renal cysts are seen. A 3 mm stone is noted at the lower pole of the left kidney. The kidneys are otherwise unremarkable. There is no evidence of hydronephrosis. No obstructing ureteral stones are seen. No perinephric stranding is appreciated.  A relatively long segment of wall thickening is noted along the mid to distal ileum, without evidence for obstruction. Mild associated soft tissue inflammation is seen. Findings are concerning for infectious or inflammatory ileitis.  The stomach is within normal limits. No acute vascular abnormalities are seen.  The appendix is not  definitely seen; there is no evidence of appendicitis. The colon is unremarkable in appearance.  The bladder is mildly distended and grossly unremarkable. No inguinal lymphadenopathy is seen.  No acute osseous abnormalities are identified.  IMPRESSION: 1. Relatively long segment of wall thickening along the mid to distal ileum, without evidence for obstruction. Mild associated soft tissue inflammation seen. Findings concerning  for infectious or inflammatory ileitis. The more distal ileum is decompressed and grossly unremarkable. 2. Innumerable scattered bilateral renal cysts seen. 3 mm nonobstructing stone noted at the lower pole of the left kidney. 3. Mild left lower lobe bronchiectasis noted, with mild associated atelectasis.   Electronically Signed   By: Garald Balding M.D.   On: 10/07/2013 05:48   Medications: I have reviewed the patient's current medications. Scheduled Meds: . ciprofloxacin  400 mg Intravenous Q12H  . enoxaparin (LOVENOX) injection  40 mg Subcutaneous Daily  . metronidazole  500 mg Intravenous Q8H   Continuous Infusions:  PRN Meds:.HYDROmorphone (DILAUDID) injection, promethazine  Assessment/Plan: [name] is a 48 y.o. female w/ PMHx of obesity, h/o ileitis, s/p cholecystectomy and appendectomy, admitted for suspected ileitis and UTI.  Ileitis- Patient still w/ mild nausea and continued abdominal pain today. Seen by surgery yesterday, do not suspect ischemia, no need for surgical intervention. Question of IBD, however, CRP and ESR wnl. Repeat lactic acid this AM 0.7, no acidosis or AG on BMP. Given CT findings and labs, suspect infectious ileitis.  -GI recs pending -Continue Cipro + Flagyl -Keep NPO given continued abdominal pain + nausea -Dilaudid 1 mg q2h prn -Phenergan prn nausea -IVF; D5 NS @ 75 ml/hr for now -Blood cultures pending -Stool pathogen panel  UTI- Patient continues to deny symptoms. Per chart review, patient has had urine culture positive for ESBL E. Coli in 2012.  -Urine culture pending -Cipro + Flagyl as above; will narrow w/ culture results  DVT/PE PPx- Lovenox   Dispo: Disposition is deferred at this time, awaiting improvement of current medical problems.  Anticipated discharge in approximately 1-2 day(s).   The patient does not have a current PCP (Provider Default, MD) and does not need an [facility] hospital follow-up appointment after discharge.  The  patient does not have transportation limitations that hinder transportation to clinic appointments.  .Services Needed at time of discharge: Y = Yes, Blank = No PT:   OT:   RN:   Equipment:   Other:     LOS: 1 day   Corky Sox, MD 10/08/2013, 10:02 AM

## 2013-10-08 NOTE — Progress Notes (Signed)
Agree with above, does not look ischemic, hopefully resolves conservatively.

## 2013-10-09 ENCOUNTER — Inpatient Hospital Stay (HOSPITAL_COMMUNITY): Payer: BC Managed Care – PPO

## 2013-10-09 LAB — COMPREHENSIVE METABOLIC PANEL
ALT: 12 U/L (ref 0–35)
AST: 16 U/L (ref 0–37)
Albumin: 2.8 g/dL — ABNORMAL LOW (ref 3.5–5.2)
Alkaline Phosphatase: 66 U/L (ref 39–117)
BUN: 8 mg/dL (ref 6–23)
CO2: 24 mEq/L (ref 19–32)
Calcium: 8.5 mg/dL (ref 8.4–10.5)
Chloride: 105 mEq/L (ref 96–112)
Creatinine, Ser: 0.83 mg/dL (ref 0.50–1.10)
GFR calc Af Amer: >90 mL/min (ref >90)
GFR calc non Af Amer: 81 mL/min — ABNORMAL LOW (ref >90)
Glucose, Bld: 99 mg/dL (ref 70–99)
Potassium: 3.9 mEq/L (ref 3.7–5.3)
Sodium: 140 mEq/L (ref 137–147)
Total Bilirubin: 0.3 mg/dL (ref 0.3–1.2)
Total Protein: 6.1 g/dL (ref 6.0–8.3)

## 2013-10-09 LAB — CBC
HCT: 35 % — ABNORMAL LOW (ref 36.0–46.0)
Hemoglobin: 11 g/dL — ABNORMAL LOW (ref 12.0–15.0)
MCH: 24.9 pg — ABNORMAL LOW (ref 26.0–34.0)
MCHC: 31.4 g/dL (ref 30.0–36.0)
MCV: 79.4 fL (ref 78.0–100.0)
Platelets: 260 10*3/uL (ref 150–400)
RBC: 4.41 MIL/uL (ref 3.87–5.11)
RDW: 16.2 % — ABNORMAL HIGH (ref 11.5–15.5)
WBC: 4.8 10*3/uL (ref 4.0–10.5)

## 2013-10-09 LAB — OCCULT BLOOD X 1 CARD TO LAB, STOOL: Fecal Occult Bld: NEGATIVE

## 2013-10-09 LAB — LIPASE, BLOOD: Lipase: 12 U/L (ref 11–59)

## 2013-10-09 MED ORDER — DEXTROSE-NACL 5-0.9 % IV SOLN
INTRAVENOUS | Status: DC
  Administered 2013-10-09 – 2013-10-10 (×2): via INTRAVENOUS

## 2013-10-09 MED ORDER — SODIUM CHLORIDE 0.9 % IV BOLUS (SEPSIS)
500.0000 mL | Freq: Once | INTRAVENOUS | Status: AC
  Administered 2013-10-09: 500 mL via INTRAVENOUS

## 2013-10-09 MED ORDER — IOHEXOL 300 MG/ML  SOLN
100.0000 mL | Freq: Once | INTRAMUSCULAR | Status: AC | PRN
  Administered 2013-10-09: 100 mL via INTRAVENOUS

## 2013-10-09 MED ORDER — PIPERACILLIN-TAZOBACTAM 3.375 G IVPB
3.3750 g | Freq: Three times a day (TID) | INTRAVENOUS | Status: DC
  Administered 2013-10-09 – 2013-10-11 (×6): 3.375 g via INTRAVENOUS
  Filled 2013-10-09 (×8): qty 50

## 2013-10-09 MED ORDER — METRONIDAZOLE IN NACL 5-0.79 MG/ML-% IV SOLN
500.0000 mg | Freq: Three times a day (TID) | INTRAVENOUS | Status: DC
  Filled 2013-10-09: qty 100

## 2013-10-09 MED ORDER — HYDROMORPHONE HCL PF 1 MG/ML IJ SOLN
1.0000 mg | INTRAMUSCULAR | Status: DC | PRN
  Administered 2013-10-09 (×5): 1 mg via INTRAVENOUS
  Filled 2013-10-09 (×6): qty 1

## 2013-10-09 MED ORDER — IOHEXOL 300 MG/ML  SOLN
25.0000 mL | INTRAMUSCULAR | Status: AC
  Administered 2013-10-09 (×2): 25 mL via ORAL

## 2013-10-09 NOTE — Progress Notes (Signed)
Pharmacy Consult - Zosyn  Abdominal infection CrCl> 20 ml/ min  Plan: 1) Zosyn 3.375 grams iv Q 8 hours - 4 hr infusion 2) DC Flagyl -- Zosyn covers same organisms?  Thank you.  Okey RegalLisa Astryd Pearcy, PharmD [phone]

## 2013-10-09 NOTE — Progress Notes (Signed)
Patient had elevated temperature. MD notified regarding. Assessment completed and placed on tele and contiuous pulse ox. Pt stated dhe did not need to void at this time. Bladder scan completed. I will get urine sample as soon as patient voids.  Avila Albritton  10/09/2013

## 2013-10-09 NOTE — Progress Notes (Signed)
[facility] Progress Note (covering for Drs. Mann/Hung)    Since last GI note: Still with abd pain, really not much better per patient.  No flatus but no vomiting, +nausea.  Objective: Vital signs in last 24 hours: Temp:  [98.9 F (37.2 C)-100.4 F (38 C)] 98.9 F (37.2 C) (04/05 0521) Pulse Rate:  [80-98] 98 (04/05 0521) Resp:  [16-20] 16 (04/05 0521) BP: (86-148)/(46-70) 132/70 mmHg (04/05 0521) SpO2:  [91 %-97 %] 93 % (04/05 0521) Last BM Date: 10/06/13 General: alert and oriented times 3 Heart: regular rate and rythm Abdomen: soft, moderately tender throughout, non-distended, normal bowel sounds   Lab Results:  Recent Labs  10/07/13 0030  WBC 4.4  HGB 12.3  PLT 310  MCV 78.0    Recent Labs  10/07/13 0030 10/08/13 0746  NA 139 141  K 3.9 4.4  CL 104 107  CO2 23 23  GLUCOSE 112* 109*  BUN 11 9  CREATININE 0.75 0.79  CALCIUM 9.1 8.7    Recent Labs  10/07/13 0030  PROT 7.1  ALBUMIN 3.5  AST 16  ALT 16  ALKPHOS 75  BILITOT 0.3   Medications: Scheduled Meds: . ciprofloxacin  400 mg Intravenous Q12H  . enoxaparin (LOVENOX) injection  40 mg Subcutaneous Daily  . metronidazole  500 mg Intravenous Q8H  . ondansetron (ZOFRAN) IV  4 mg Intravenous 3 times per day   Continuous Infusions:  PRN Meds:.HYDROmorphone (DILAUDID) injection, promethazine    Assessment/Plan: 48 y.o. female with abd pain, ileitis on CT scan  Pretty acute onset of her symptoms, pain without signficant bowel troubles previously, Sed rate and CRP were both normal (all points away from IBD).  She is still in quite a lot of pain and is tender on exam.  Should continue IV Abx (cipro/flagyl), I will order labs for today (cbc, cmet) and will follow along.  If pains not better in another 1-2 days, will have to consider repeating imaging (CT).  Drs. Mann/Hung to assume GI care in AM tomorrow.  Rachael FeeJacobs, Daxen Lanum P, MD  10/09/2013, 6:50 AM [facility] Pager [phone]  [phone]

## 2013-10-09 NOTE — Progress Notes (Signed)
Subjective: Patient seen and examined at bedside.  She has continued severe abdominal pain this morning with chills. Tmax 100.90F overnight.  No BM and denies passing gas. Did urinate this morning, denies any burning.    Objective: Vital signs in last 24 hours: Filed Vitals:   10/09/13 0521 10/09/13 0827 10/09/13 0832 10/09/13 1020  BP: 132/70   100/44  Pulse: 98 90  83  Temp: 98.9 F (37.2 C)   99.1 F (37.3 C)  TempSrc: Oral     Resp: 16     Height:      Weight:      SpO2: 93% 90% 93% 93%   Weight change:   Intake/Output Summary (Last 24 hours) at 10/09/13 1029 Last data filed at 10/09/13 0146  Gross per 24 hour  Intake    240 ml  Output    350 ml  Net   -110 ml   Physical Exam: General: Awake, alert, cooperative, acute distress due to pain HEENT: EOMI Lungs: Clear to ascultation bilaterally, normal work of respiration, no wheezes, rales, rhonchi Heart: RRR Abdomen: Soft, tender diffusely, non-distended, BS absent, +rebound tenderness and guarding.   Extremities: No cyanosis, clubbing, or edema, moving all 4 extremities Neurologic: Alert & oriented X3, sensation grossly intact  Lab Results: Basic Metabolic Panel:  Recent Labs Lab 10/07/13 0030 10/08/13 0746  NA 139 141  K 3.9 4.4  CL 104 107  CO2 23 23  GLUCOSE 112* 109*  BUN 11 9  CREATININE 0.75 0.79  CALCIUM 9.1 8.7   Liver Function Tests:  Recent Labs Lab 10/07/13 0030  AST 16  ALT 16  ALKPHOS 75  BILITOT 0.3  PROT 7.1  ALBUMIN 3.5    Recent Labs Lab 10/07/13 0509  LIPASE 21   CBC:  Recent Labs Lab 10/07/13 0030 10/09/13 0946  WBC 4.4 4.8  NEUTROABS 1.9  --   HGB 12.3 11.0*  HCT 37.3 35.0*  MCV 78.0 79.4  PLT 310 260   Urinalysis:  Recent Labs Lab 10/07/13 0406  COLORURINE YELLOW  LABSPEC 1.025  PHURINE 6.0  GLUCOSEU NEGATIVE  HGBUR SMALL*  BILIRUBINUR NEGATIVE  KETONESUR 15*  PROTEINUR NEGATIVE  UROBILINOGEN 0.2  NITRITE POSITIVE*  LEUKOCYTESUR LARGE*    Micro Results: Recent Results (from the past 240 hour(s))  CULTURE, BLOOD (ROUTINE X 2)     Status: None   Collection Time    10/07/13  2:40 PM      Result Value Ref Range Status   Specimen Description BLOOD RIGHT ARM   Final   Special Requests     Final   Value: BOTTLES DRAWN AEROBIC AND ANAEROBIC 10CC AER,5CC ANA   Culture  Setup Time     Final   Value: 10/07/2013 19:24     Performed at [facility]   Culture     Final   Value:        BLOOD CULTURE RECEIVED NO GROWTH TO DATE CULTURE WILL BE HELD FOR 5 DAYS BEFORE ISSUING A FINAL NEGATIVE REPORT     Performed at [facility]   Report Status PENDING   Incomplete  CULTURE, BLOOD (ROUTINE X 2)     Status: None   Collection Time    10/07/13  2:46 PM      Result Value Ref Range Status   Specimen Description BLOOD RIGHT HAND   Final   Special Requests BOTTLES DRAWN AEROBIC ONLY 10CC   Final   Culture  Setup Time  Final   Value: 10/07/2013 19:25     Performed at [facility]   Culture     Final   Value:        BLOOD CULTURE RECEIVED NO GROWTH TO DATE CULTURE WILL BE HELD FOR 5 DAYS BEFORE ISSUING A FINAL NEGATIVE REPORT     Performed at [facility]   Report Status PENDING   Incomplete   Medications: I have reviewed the patient's current medications. Scheduled Meds: . ciprofloxacin  400 mg Intravenous Q12H  . enoxaparin (LOVENOX) injection  40 mg Subcutaneous Daily  . metronidazole  500 mg Intravenous Q8H  . ondansetron (ZOFRAN) IV  4 mg Intravenous 3 times per day  . sodium chloride  500 mL Intravenous Once   Continuous Infusions:  PRN Meds:.HYDROmorphone (DILAUDID) injection  Assessment/Plan: [name] is a 48 y.o. female w/ PMHx of obesity, h/o ileitis, s/p cholecystectomy and appendectomy, admitted for ileitis and UTI.  Ileitis--abdominal pan not improving, not passing gas and no BM.  No leukocytosis. Appreciate surgery and GI following.  Favoring non-surgical management at  this time. Will broaden antibiotics in setting of T max 100.43F yesterday.   -Discontinue Cipro + Flagyl, start IV Zosyn -Abdominal xray with scattered large and small bowel gas -Keep NPO given continued abdominal pain + nausea -Dilaudid 1 mg q2h prn, caution for respiratory status and sedation -Zofran scheduled 4mg  iv q8h x2 days -IVF; NS bolus 500cc earlier today, now maintenance with D5 NS @ 100cc ml/hr for now -Blood cultures pending--NGTD -Stool pathogen panel -FOBT negative -Repeat lactic acid -Repeat CT A/P with contrast given non-improving pain, surgery will follow scan as well -Close monitoring of vitals -O2 prn, given noted desat to 88% previously after IV pain medication  UTI--urinated today, denies dysuria. Per chart review, patient has had urine culture positive for ESBL E. Coli in 2012.  -Urine culture pending -Now on zosyn  DVT/PE PPx- Lovenox [place]  Dispo: Disposition is deferred at this time, awaiting improvement of current medical problems.  Anticipated discharge in approximately 1-2 day(s).   The patient does not have a current PCP (Provider Default, MD) and does not need an [facility] hospital follow-up appointment after discharge.  The patient does not have transportation limitations that hinder transportation to clinic appointments.  Services Needed at time of discharge: Y = Yes, Blank = No PT:   OT:   RN:   Equipment:   Other:     LOS: 2 days   Darden Palmer, MD 10/09/2013, 10:29 AM

## 2013-10-09 NOTE — Progress Notes (Signed)
INTERNAL MEDICINE TEACHING ATTENDING NOTE  Examined patient today at about 1030 am. Pain abdomen not better. Complains of 10/10 pain today with pain medication. Painful even to take a deep breath per patient.   Vitals: Spiked 100.4 tmax in the past 24 hours. Blood pressure 100/44 at 1020am. Not tachycardic. Was on room air yesterday, has been put on 2l nasal canula because she was doing low normal sats on pain medications.  On exam: soft abdomen, no skin discoloration, inverted umbilicus, no distension, extremely tender to palpation diffusely, centrally most tender, some guarding, bowel sounds absent.    Assessment: I worry about antibiotics not covering the infectious agent, or ileus/obstruction coming into play. Presentation does favor infectious etiology, however I will still consider a surgical acute abdomen in mind (obstruction, ileus, mesenteric ischemia). Surgery consulted by my resident and I have reviewed their note. For now, we will do:  - Stat abdominal Xray - Stat abdominal CT with contrast - Broaden antibiotic coverage to zocyn and flagyl.  - Serial abdominal exams.

## 2013-10-09 NOTE — Progress Notes (Signed)
Patient ID: Connie Mcneil, female   DOB: 06/16/1966, 48 y.o.   MRN: [id number]    Subjective: Continued diffuse abd pain, no better per pt and maybe a little worse. Nausea without vomiting. No flatus or BM  Objective: Vital signs in last 24 hours: Temp:  [98.9 F (37.2 C)-100.4 F (38 C)] 98.9 F (37.2 C) (04/05 0521) Pulse Rate:  [80-98] 83 (04/05 1020) Resp:  [16-20] 16 (04/05 0521) BP: (86-148)/(44-70) 100/44 mmHg (04/05 1020) SpO2:  [90 %-97 %] 93 % (04/05 1020) Last BM Date: 10/06/13  Intake/Output from previous day: 04/04 0701 - 04/05 0700 In: 240 [P.O.:240] Out: 350 [Urine:350] Intake/Output this shift:    General appearance: alert, cooperative and moderate distress GI: abnormal findings:  absent bowel sounds and marked tenderness in the entire abdomen and more in upper abdomen with some guarding.  No definite peritoneal signs  Lab Results:   Recent Labs  10/07/13 0030 10/09/13 0946  WBC 4.4 4.8  HGB 12.3 11.0*  HCT 37.3 35.0*  PLT 310 260   BMET  Recent Labs  10/08/13 0746 10/09/13 0946  NA 141 140  K 4.4 3.9  CL 107 105  CO2 23 24  GLUCOSE 109* 99  BUN 9 8  CREATININE 0.79 0.83  CALCIUM 8.7 8.5     Studies/Results: Dg Abd Portable 1v  10/09/2013   CLINICAL DATA:  Abdominal pain  EXAM: PORTABLE ABDOMEN - 1 VIEW  COMPARISON:  10/07/2013  FINDINGS: Scattered large and small bowel gas is identified. No acute abnormality is seen. Fecal material is noted within the colon. No bony abnormality is noted.  IMPRESSION: Nonspecific abdomen.   Electronically Signed   By: Alcide CleverMark  Lukens M.D.   On: 10/09/2013 11:04    Anti-infectives: Anti-infectives   Start     Dose/Rate Route Frequency Ordered Stop   10/07/13 1000  metroNIDAZOLE (FLAGYL) IVPB 500 mg     500 mg 100 mL/hr over 60 Minutes Intravenous Every 8 hours 10/07/13 0927     10/07/13 1000  ciprofloxacin (CIPRO) IVPB 400 mg  Status:  Discontinued     400 mg 200 mL/hr over 60 Minutes Intravenous Every 12  hours 10/07/13 0927 10/09/13 1105   10/07/13 0630  metroNIDAZOLE (FLAGYL) IVPB 500 mg     500 mg 100 mL/hr over 60 Minutes Intravenous  Once 10/07/13 0620 10/07/13 0741   10/07/13 0630  levofloxacin (LEVAQUIN) IVPB 500 mg     500 mg 100 mL/hr over 60 Minutes Intravenous  Once 10/07/13 0620 10/07/13 0843      Assessment/Plan: Acute ileitis, probable infectious vs less likely IBD.  Not improving but not clearly worse, WBC nl.  Repeat CT today seems reasonable Continue current non surgical Rx for now    LOS: 2 days    Teodor Prater T 10/09/2013

## 2013-10-10 DIAGNOSIS — R82998 Other abnormal findings in urine: Secondary | ICD-10-CM

## 2013-10-10 LAB — BASIC METABOLIC PANEL
BUN: 5 mg/dL — ABNORMAL LOW (ref 6–23)
CO2: 23 mEq/L (ref 19–32)
Calcium: 8.3 mg/dL — ABNORMAL LOW (ref 8.4–10.5)
Chloride: 107 mEq/L (ref 96–112)
Creatinine, Ser: 0.79 mg/dL (ref 0.50–1.10)
GFR calc Af Amer: >90 mL/min (ref >90)
GFR calc non Af Amer: >90 mL/min (ref >90)
Glucose, Bld: 93 mg/dL (ref 70–99)
Potassium: 3.9 mEq/L (ref 3.7–5.3)
Sodium: 142 mEq/L (ref 137–147)

## 2013-10-10 LAB — CBC
HCT: 34.7 % — ABNORMAL LOW (ref 36.0–46.0)
Hemoglobin: 11.2 g/dL — ABNORMAL LOW (ref 12.0–15.0)
MCH: 25.2 pg — ABNORMAL LOW (ref 26.0–34.0)
MCHC: 32.3 g/dL (ref 30.0–36.0)
MCV: 78 fL (ref 78.0–100.0)
Platelets: 233 10*3/uL (ref 150–400)
RBC: 4.45 MIL/uL (ref 3.87–5.11)
RDW: 15.9 % — ABNORMAL HIGH (ref 11.5–15.5)
WBC: 3 10*3/uL — ABNORMAL LOW (ref 4.0–10.5)

## 2013-10-10 LAB — LACTIC ACID, PLASMA: Lactic Acid, Venous: 0.7 mmol/L (ref 0.5–2.2)

## 2013-10-10 MED ORDER — KETOROLAC TROMETHAMINE 60 MG/2ML IM SOLN
60.0000 mg | Freq: Once | INTRAMUSCULAR | Status: AC
  Administered 2013-10-10: 60 mg via INTRAMUSCULAR
  Filled 2013-10-10: qty 2

## 2013-10-10 MED ORDER — SODIUM CHLORIDE 0.9 % IV BOLUS (SEPSIS): 1000.0000 mL | Freq: Once | INTRAVENOUS | Status: DC

## 2013-10-10 MED ORDER — POLYETHYLENE GLYCOL 3350 17 G PO PACK
17.0000 g | PACK | Freq: Every day | ORAL | Status: DC
  Administered 2013-10-10: 17 g via ORAL
  Filled 2013-10-10: qty 1

## 2013-10-10 MED ORDER — SODIUM CHLORIDE 0.9 % IV BOLUS (SEPSIS)
500.0000 mL | Freq: Once | INTRAVENOUS | Status: AC
  Administered 2013-10-10: 500 mL via INTRAVENOUS

## 2013-10-10 MED ORDER — ONDANSETRON HCL 4 MG/2ML IJ SOLN
4.0000 mg | Freq: Four times a day (QID) | INTRAMUSCULAR | Status: AC | PRN
  Administered 2013-10-11 – 2013-10-12 (×2): 4 mg via INTRAVENOUS
  Filled 2013-10-10 (×2): qty 2

## 2013-10-10 MED ORDER — KETOROLAC TROMETHAMINE 30 MG/ML IJ SOLN
30.0000 mg | Freq: Once | INTRAMUSCULAR | Status: AC
  Administered 2013-10-10: 30 mg via INTRAVENOUS
  Filled 2013-10-10: qty 1

## 2013-10-10 MED ORDER — PROMETHAZINE HCL 25 MG/ML IJ SOLN
12.5000 mg | Freq: Four times a day (QID) | INTRAMUSCULAR | Status: DC | PRN
  Administered 2013-10-10: 12.5 mg via INTRAVENOUS
  Filled 2013-10-10: qty 1

## 2013-10-10 MED ORDER — POLYETHYLENE GLYCOL 3350 17 G PO PACK
17.0000 g | PACK | Freq: Two times a day (BID) | ORAL | Status: DC
  Administered 2013-10-10: 17 g via ORAL
  Filled 2013-10-10 (×5): qty 1

## 2013-10-10 NOTE — Progress Notes (Signed)
Subjective: Pain is about the same today.  BP a bit better after volume.  CBC pending today.    Objective: Vital signs in last 24 hours: Temp:  [97.9 F (36.6 C)-99.2 F (37.3 C)] 97.9 F (36.6 C) (04/06 0905) Pulse Rate:  [63-83] 66 (04/06 0905) Resp:  [18] 18 (04/06 0905) BP: (82-103)/(37-65) 103/51 mmHg (04/06 0905) SpO2:  [93 %-98 %] 98 % (04/06 0905) Last BM Date: 10/06/13  Intake/Output from previous day: 04/05 0701 - 04/06 0700 In: -  Out: 1300 [Urine:1300] Intake/Output this shift:   PE General appearance: alert, cooperative and no distress GI: +bs, abdomen is soft, generalized tenderness without peritonitis.    Lab Results:   Recent Labs  10/09/13 0946  WBC 4.8  HGB 11.0*  HCT 35.0*  PLT 260   BMET  Recent Labs  10/08/13 0746 10/09/13 0946  NA 141 140  K 4.4 3.9  CL 107 105  CO2 23 24  GLUCOSE 109* 99  BUN 9 8  CREATININE 0.79 0.83  CALCIUM 8.7 8.5   PT/INR No results found for this basename: LABPROT, INR,  in the last 72 hours ABG No results found for this basename: PHART, PCO2, PO2, HCO3,  in the last 72 hours  Studies/Results: Ct Abdomen Pelvis W Contrast  10/09/2013   CLINICAL DATA:  Worsening abdominal pain, fever overnight, ileitis on prior CT  EXAM: CT ABDOMEN AND PELVIS WITH CONTRAST  TECHNIQUE: Multidetector CT imaging of the abdomen and pelvis was performed using the standard protocol following bolus administration of intravenous contrast.  CONTRAST:  OMNIPAQUE IOHEXOL 300 MG/ML  SOLN  COMPARISON:  DG ABD PORTABLE 1V dated 10/09/2013; CT ABD/PELVIS W CM dated 10/07/2013  FINDINGS: Mild areas of increased density have developed within the lung bases. Peripheral mild linear areas of increased density within the lung bases. Region of mild bronchiectasis within the medial basal portion of the left lower lobe.  The liver, spleen, adrenals, pancreas are unremarkable. Multiple innumerable small bilateral renal cysts are appreciated. Stable 3  mm calculus lower pole left kidney. Kidneys otherwise unremarkable.  Patient status post cholecystectomy.  The segmental area of ileal bowel wall thickening has significantly reduced when compared to the previous study. There is otherwise no evidence of bowel obstruction, nor further regions of enteritis, colitis, or diverticulitis. Small reactive mesenteric lymph nodes are appreciated.  No abdominal or pelvic masses, free fluid, nor loculated fluid collections. No evidence of pneumoperitoneum.  The celiac, SMA, IMA, portal vein, SMV are opacified. There is no evidence of abdominal aortic aneurysm.  No abdominal wall no inguinal hernia appreciated.  No aggressive appearing osseous lesions.  IMPRESSION: 1. Marked improvement in the bowel wall thickening involving the distal ileum no further evidence regions of ileitis. 2. Otherwise no evidence of further focal or acute abnormalities. 3. Stable bilateral annual renal cyst and small 3 mm left medullary calculus 4. ibasilar atelectasis versus mild infiltrates 5. Mild bronchiectasis left lower lobe   Electronically Signed   By: Salome Holmes M.D.   On: 10/09/2013 14:11   Dg Abd Portable 1v  10/09/2013   CLINICAL DATA:  Abdominal pain  EXAM: PORTABLE ABDOMEN - 1 VIEW  COMPARISON:  10/07/2013  FINDINGS: Scattered large and small bowel gas is identified. No acute abnormality is seen. Fecal material is noted within the colon. No bony abnormality is noted.  IMPRESSION: Nonspecific abdomen.   Electronically Signed   By: Alcide Clever M.D.   On: 10/09/2013 11:04    Anti-infectives:  Anti-infectives   Start     Dose/Rate Route Frequency Ordered Stop   10/09/13 1800  metroNIDAZOLE (FLAGYL) IVPB 500 mg  Status:  Discontinued     500 mg 100 mL/hr over 60 Minutes Intravenous Every 8 hours 10/09/13 1121 10/09/13 1409   10/09/13 1130  piperacillin-tazobactam (ZOSYN) IVPB 3.375 g     3.375 g 12.5 mL/hr over 240 Minutes Intravenous Every 8 hours 10/09/13 1119     10/07/13  1000  metroNIDAZOLE (FLAGYL) IVPB 500 mg  Status:  Discontinued     500 mg 100 mL/hr over 60 Minutes Intravenous Every 8 hours 10/07/13 0927 10/09/13 1120   10/07/13 1000  ciprofloxacin (CIPRO) IVPB 400 mg  Status:  Discontinued     400 mg 200 mL/hr over 60 Minutes Intravenous Every 12 hours 10/07/13 0927 10/09/13 1105   10/07/13 0630  metroNIDAZOLE (FLAGYL) IVPB 500 mg     500 mg 100 mL/hr over 60 Minutes Intravenous  Once 10/07/13 0620 10/07/13 0741   10/07/13 0630  levofloxacin (LEVAQUIN) IVPB 500 mg     500 mg 100 mL/hr over 60 Minutes Intravenous  Once 10/07/13 0620 10/07/13 0843      Assessment/Plan: Acute ileitis, probable infectious vs less likely IBD No acute abdomen Continue IV antibtiocs Bowel rest Fluid resuscitation per primary team.  Surgery will follow.     LOS: 3 days    Joshiah Traynham ANP-BC 10/10/2013 9:35 AM

## 2013-10-10 NOTE — Progress Notes (Signed)
After 55000ml/ NS bolus patient BP was 93/44 manual (taken at 4am). At 05:45am BP is now 85/40 manual. MD is aware. Patient denies being symptomatic at this time. Patient is getting maint. Fluids at 14400ml/hr. Will continue to monitor

## 2013-10-10 NOTE — Progress Notes (Signed)
Subjective: Patient seen and examined at bedside. Says she is feeling somewhat better, but only a little bit. Denies nausea. Abdominal exam with mold to moderate diffuse tenderness. Still w/ hypoactive bowel sounds.   Objective: Vital signs in last 24 hours: Filed Vitals:   10/10/13 0203 10/10/13 0358 10/10/13 0529 10/10/13 0543  BP:  93/44 88/37 85/40   Pulse:  67 68   Temp: 98.2 F (36.8 C) 97.9 F (36.6 C) 97.9 F (36.6 C)   TempSrc:  Oral Oral   Resp:   18   Height:      Weight:      SpO2:  97% 96%    Weight change:   Intake/Output Summary (Last 24 hours) at 10/10/13 0645 Last data filed at 10/09/13 1600  Gross per 24 hour  Intake      0 ml  Output    500 ml  Net   -500 ml   Physical Exam: General: Awake, alert, cooperative, NAD. HEENT: EOMI Lungs: Clear to ascultation bilaterally, normal work of respiration, no wheezes, rales, rhonchi Heart: RRR Abdomen: Soft, tender diffusely, non-distended, hypoactive BS. No rebound or guarding. Extremities: No cyanosis, clubbing, or edema, moving all 4 extremities Neurologic: Alert & oriented X3, sensation grossly intact  Lab Results: Basic Metabolic Panel:  Recent Labs Lab 10/08/13 0746 10/09/13 0946  NA 141 140  K 4.4 3.9  CL 107 105  CO2 23 24  GLUCOSE 109* 99  BUN 9 8  CREATININE 0.79 0.83  CALCIUM 8.7 8.5   Liver Function Tests:  Recent Labs Lab 10/07/13 0030 10/09/13 0946  AST 16 16  ALT 16 12  ALKPHOS 75 66  BILITOT 0.3 0.3  PROT 7.1 6.1  ALBUMIN 3.5 2.8*    Recent Labs Lab 10/07/13 0509 10/09/13 1241  LIPASE 21 12   CBC:  Recent Labs Lab 10/07/13 0030 10/09/13 0946  WBC 4.4 4.8  NEUTROABS 1.9  --   HGB 12.3 11.0*  HCT 37.3 35.0*  MCV 78.0 79.4  PLT 310 260   Urinalysis:  Recent Labs Lab 10/07/13 0406  COLORURINE YELLOW  LABSPEC 1.025  PHURINE 6.0  GLUCOSEU NEGATIVE  HGBUR SMALL*  BILIRUBINUR NEGATIVE  KETONESUR 15*  PROTEINUR NEGATIVE  UROBILINOGEN 0.2  NITRITE  POSITIVE*  LEUKOCYTESUR LARGE*   Micro Results: Recent Results (from the past 240 hour(s))  CULTURE, BLOOD (ROUTINE X 2)     Status: None   Collection Time    10/07/13  2:40 PM      Result Value Ref Range Status   Specimen Description BLOOD RIGHT ARM   Final   Special Requests     Final   Value: BOTTLES DRAWN AEROBIC AND ANAEROBIC 10CC AER,5CC ANA   Culture  Setup Time     Final   Value: 10/07/2013 19:24     Performed at [facility]   Culture     Final   Value:        BLOOD CULTURE RECEIVED NO GROWTH TO DATE CULTURE WILL BE HELD FOR 5 DAYS BEFORE ISSUING A FINAL NEGATIVE REPORT     Performed at [facility]   Report Status PENDING   Incomplete  CULTURE, BLOOD (ROUTINE X 2)     Status: None   Collection Time    10/07/13  2:46 PM      Result Value Ref Range Status   Specimen Description BLOOD RIGHT HAND   Final   Special Requests BOTTLES DRAWN AEROBIC ONLY 10CC   Final  Culture  Setup Time     Final   Value: 10/07/2013 19:25     [facility]   Culture     Final   Value:        BLOOD CULTURE RECEIVED NO GROWTH TO DATE CULTURE WILL BE HELD FOR 5 DAYS BEFORE ISSUING A FINAL NEGATIVE REPORT     Performed at [facility]   Report Status PENDING   Incomplete   Medications: I have reviewed the patient's current medications. Scheduled Meds: . enoxaparin (LOVENOX) injection  40 mg Subcutaneous Daily  . ondansetron (ZOFRAN) IV  4 mg Intravenous 3 times per day  . piperacillin-tazobactam (ZOSYN)  IV  3.375 g Intravenous Q8H   Continuous Infusions: . dextrose 5 % and 0.9% NaCl 100 mL/hr at 10/09/13 1501   PRN Meds:.  Assessment/Plan: Connie Mcneil is a 48 y.o. female w/ PMHx of obesity, h/o ileitis, s/p cholecystectomy and appendectomy, admitted for ileitis.  Ileitis- Very mild improvement today on physical exam. Still no flatus or BM. Very possible that constipation is contributing to her pain at this time. CT abdomen/pelvis from  10/09/13 showed marked improvement in ileitis. Surgery following, feel that patient does not have acute abdomen.  -Continue Zosyn -Will give Miralax daily for constipation -Resart clear liquid diet -Hold prn narcotic pain meds in setting of HoTN; will give low dose as necessary. -Zofran scheduled 4mg  IV q8h x2 days -IVF; D5 NS @ 100cc ml/hr for now; will decrease w/ adequate po intake. -Blood cultures pending; negative to date.  Mild Asymptomatic Bacteriuria- Asymptomatic. Urine culture w/ 30,000 colonies of E. Coli. Previously had UTI positive for ESBL E. Coli in 2012.  -Zosyn  DVT/PE PPx- Lovenox [place]  Dispo: Disposition is deferred at this time, awaiting improvement of current medical problems.  Anticipated discharge in approximately 1-2 day(s).   The patient does not have a current PCP (Provider Default, MD) and does not need [facility] after discharge.  The patient does not have transportation limitations that hinder transportation to clinic appointments.  Services Needed at time of discharge: Y = Yes, Blank = No PT:   OT:   RN:   Equipment:   Other:     LOS: 3 days   Connie ParisEden W Jaimere Feutz, MD 10/10/2013, 6:45 AM

## 2013-10-10 NOTE — Progress Notes (Signed)
General Surgery [facility]  Patient seen and examined.  Complains of abdominal pain and requests pain medication.  CT scan significantly improved.  WBC normal.  Abdomen soft, obese, with mild diffuse tenderness, poorly localized.  Begin clear liquid diet.  Encouraged ambulation in halls.  Will follow.  Velora Hecklerodd M. Shadell Brenn, MD, [facility] Office: [phone]

## 2013-10-10 NOTE — Progress Notes (Signed)
Throughout the shift patients blood pressure remains 80's/50's. Patient c/o ABD pain 10/10. Order for dilaudid 1mg  Q2hr IV for pain. Called on call MD to notify of patients vitals and pain. New orders for toradol 60mg  IM once and 500ml NS bolus. Dilaudid is discontinued at this time. Will continue to monitor.

## 2013-10-10 NOTE — Progress Notes (Signed)
Unassigned patient Subjective: Patient continues to have severe abdominal pain-she claims she is not feeling much better even though the CT scan reveals marked improvement in the ileitis. She has not had a BM since admission. No nausea or vomiting. Tolerating clears well.   Objective: Vital signs in last 24 hours: Temp:  [97.9 F (36.6 C)-99.2 F (37.3 C)] 99.2 F (37.3 C) (04/06 1413) Pulse Rate:  [63-81] 81 (04/06 1413) Resp:  [18] 18 (04/06 1413) BP: (82-112)/(37-65) 98/64 mmHg (04/06 1413) SpO2:  [93 %-98 %] 98 % (04/06 1413) Last BM Date: 10/06/13  Intake/Output from previous day: 04/05 0701 - 04/06 0700 In: -  Out: 1300 [Urine:1300] Intake/Output this shift: Total I/O In: -  Out: 1800 [Urine:1800]  General appearance: alert, cooperative, appears stated age and no distress Resp: clear to auscultation bilaterally Cardio: regular rate and rhythm, S1, S2 normal, no murmur, click, rub or gallop GI: soft, diffusely tender; hypoactive bowel sounds normal; no masses,  no organomegaly Extremities: extremities normal, atraumatic, no cyanosis or edema  Lab Results:  Recent Labs  10/09/13 0946 10/10/13 1101  WBC 4.8 3.0*  HGB 11.0* 11.2*  HCT 35.0* 34.7*  PLT 260 233   BMET  Recent Labs  10/08/13 0746 10/09/13 0946 10/10/13 1101  NA 141 140 142  K 4.4 3.9 3.9  CL 107 105 107  CO2 23 24 23   GLUCOSE 109* 99 93  BUN 9 8 5*  CREATININE 0.79 0.83 0.79  CALCIUM 8.7 8.5 8.3*   LFT  Recent Labs  10/09/13 0946  PROT 6.1  ALBUMIN 2.8*  AST 16  ALT 12  ALKPHOS 66  BILITOT 0.3   Studies/Results: Ct Abdomen Pelvis W Contrast  10/09/2013   CLINICAL DATA:  Worsening abdominal pain, fever overnight, ileitis on prior CT  EXAM: CT ABDOMEN AND PELVIS WITH CONTRAST  TECHNIQUE: Multidetector CT imaging of the abdomen and pelvis was performed using the standard protocol following bolus administration of intravenous contrast.  CONTRAST:  OMNIPAQUE IOHEXOL 300 MG/ML   SOLN  COMPARISON:  DG ABD PORTABLE 1V dated 10/09/2013; CT ABD/PELVIS W CM dated 10/07/2013  FINDINGS: Mild areas of increased density have developed within the lung bases. Peripheral mild linear areas of increased density within the lung bases. Region of mild bronchiectasis within the medial basal portion of the left lower lobe.  The liver, spleen, adrenals, pancreas are unremarkable. Multiple innumerable small bilateral renal cysts are appreciated. Stable 3 mm calculus lower pole left kidney. Kidneys otherwise unremarkable.  Patient status post cholecystectomy.  The segmental area of ileal bowel wall thickening has significantly reduced when compared to the previous study. There is otherwise no evidence of bowel obstruction, nor further regions of enteritis, colitis, or diverticulitis. Small reactive mesenteric lymph nodes are appreciated.  No abdominal or pelvic masses, free fluid, nor loculated fluid collections. No evidence of pneumoperitoneum.  The celiac, SMA, IMA, portal vein, SMV are opacified. There is no evidence of abdominal aortic aneurysm.  No abdominal wall no inguinal hernia appreciated.  No aggressive appearing osseous lesions.  IMPRESSION: 1. Marked improvement in the bowel wall thickening involving the distal ileum no further evidence regions of ileitis. 2. Otherwise no evidence of further focal or acute abnormalities. 3. Stable bilateral annual renal cyst and small 3 mm left medullary calculus 4. ibasilar atelectasis versus mild infiltrates 5. Mild bronchiectasis left lower lobe   Electronically Signed   By: Salome Holmes M.D.   On: 10/09/2013 14:11   Dg Abd Portable 1v  10/09/2013   CLINICAL DATA:  Abdominal pain  EXAM: PORTABLE ABDOMEN - 1 VIEW  COMPARISON:  10/07/2013  FINDINGS: Scattered large and small bowel gas is identified. No acute abnormality is seen. Fecal material is noted within the colon. No bony abnormality is noted.  IMPRESSION: Nonspecific abdomen.   Electronically Signed   By:  Alcide CleverMark  Lukens M.D.   On: 10/09/2013 11:04   Medications: I have reviewed the patient's current medications.  Assessment/Plan: ?Infectious ileitis: seems to be improving on CT but apparently slow to respond clinically. Continue antibiotics; encourage ambulation and minimize pain medication use.  2) Constipation-agree with Colace and Miralax.   LOS: 3 days   Emrah Ariola 10/10/2013, 4:55 PM

## 2013-10-11 LAB — BASIC METABOLIC PANEL
BUN: 5 mg/dL — ABNORMAL LOW (ref 6–23)
CO2: 22 mEq/L (ref 19–32)
Calcium: 8.4 mg/dL (ref 8.4–10.5)
Chloride: 108 mEq/L (ref 96–112)
Creatinine, Ser: 0.79 mg/dL (ref 0.50–1.10)
GFR calc Af Amer: >90 mL/min (ref >90)
GFR calc non Af Amer: >90 mL/min (ref >90)
Glucose, Bld: 101 mg/dL — ABNORMAL HIGH (ref 70–99)
Potassium: 3.7 mEq/L (ref 3.7–5.3)
Sodium: 143 mEq/L (ref 137–147)

## 2013-10-11 LAB — URINE CULTURE: Colony Count: 30000

## 2013-10-11 LAB — CBC
HCT: 34.8 % — ABNORMAL LOW (ref 36.0–46.0)
Hemoglobin: 11.2 g/dL — ABNORMAL LOW (ref 12.0–15.0)
MCH: 24.9 pg — ABNORMAL LOW (ref 26.0–34.0)
MCHC: 32.2 g/dL (ref 30.0–36.0)
MCV: 77.3 fL — ABNORMAL LOW (ref 78.0–100.0)
Platelets: 255 10*3/uL (ref 150–400)
RBC: 4.5 MIL/uL (ref 3.87–5.11)
RDW: 15.8 % — ABNORMAL HIGH (ref 11.5–15.5)
WBC: 3.2 10*3/uL — ABNORMAL LOW (ref 4.0–10.5)

## 2013-10-11 MED ORDER — DOCUSATE SODIUM 100 MG PO CAPS
100.0000 mg | ORAL_CAPSULE | Freq: Two times a day (BID) | ORAL | Status: DC
  Filled 2013-10-11: qty 1

## 2013-10-11 MED ORDER — KETOROLAC TROMETHAMINE 30 MG/ML IJ SOLN
30.0000 mg | Freq: Once | INTRAMUSCULAR | Status: AC
  Administered 2013-10-11: 30 mg via INTRAVENOUS
  Filled 2013-10-11: qty 1

## 2013-10-11 MED ORDER — SODIUM CHLORIDE 0.9 % IV SOLN
INTRAVENOUS | Status: AC
  Administered 2013-10-11: 18:00:00 via INTRAVENOUS
  Administered 2013-10-12: 1000 mL via INTRAVENOUS

## 2013-10-11 MED ORDER — METRONIDAZOLE IN NACL 5-0.79 MG/ML-% IV SOLN
500.0000 mg | Freq: Three times a day (TID) | INTRAVENOUS | Status: DC
  Administered 2013-10-11 – 2013-10-12 (×4): 500 mg via INTRAVENOUS
  Filled 2013-10-11 (×5): qty 100

## 2013-10-11 MED ORDER — CIPROFLOXACIN IN D5W 400 MG/200ML IV SOLN
400.0000 mg | Freq: Two times a day (BID) | INTRAVENOUS | Status: DC
  Administered 2013-10-11 – 2013-10-12 (×3): 400 mg via INTRAVENOUS
  Filled 2013-10-11 (×4): qty 200

## 2013-10-11 NOTE — Progress Notes (Signed)
Subjective: Patient seen and examined at bedside. Abdominal exam improved today, however, still complaining of pain. Able to tolerate clears with some minimal nausea. Denies any other issues. Had a bowel movement this AM w/ moderate relief. Increased bowel sounds today, still tender on exam.   Objective: Vital signs in last 24 hours: Filed Vitals:   10/11/13 0200 10/11/13 0602 10/11/13 0900 10/11/13 1047  BP: 110/53 91/46 107/59 99/57  Pulse: 66 62 73 67  Temp: 99 F (37.2 C) 98.9 F (37.2 C)  98.8 F (37.1 C)  TempSrc: Oral Oral  Oral  Resp: 16 18  18   Height:      Weight:      SpO2: 94% 94% 95% 96%   Weight change:   Intake/Output Summary (Last 24 hours) at 10/11/13 1118 Last data filed at 10/11/13 0130  Gross per 24 hour  Intake    800 ml  Output   2200 ml  Net  -1400 ml   Physical Exam: General: Awake, alert, cooperative, NAD. HEENT: EOMI, PERRL Lungs: Clear to ascultation bilaterally, normal work of respiration, no wheezes, rales, rhonchi Heart: RRR. No murmurs, gallops, rubs. Abdomen: Soft, tender diffusely, non-distended, BS +. Minimal guarding, no rebound.  Extremities: No cyanosis, clubbing, or edema, moving all 4 extremities Neurologic: Alert & oriented X3, sensation grossly intact, no focal motor abnormalities.   Lab Results: Basic Metabolic Panel:  Recent Labs Lab 10/10/13 1101 10/11/13 0755  NA 142 143  K 3.9 3.7  CL 107 108  CO2 23 22  GLUCOSE 93 101*  BUN 5* 5*  CREATININE 0.79 0.79  CALCIUM 8.3* 8.4   Liver Function Tests:  Recent Labs Lab 10/07/13 0030 10/09/13 0946  AST 16 16  ALT 16 12  ALKPHOS 75 66  BILITOT 0.3 0.3  PROT 7.1 6.1  ALBUMIN 3.5 2.8*    Recent Labs Lab 10/07/13 0509 10/09/13 1241  LIPASE 21 12   CBC:  Recent Labs Lab 10/07/13 0030  10/10/13 1101 10/11/13 0755  WBC 4.4  < > 3.0* 3.2*  NEUTROABS 1.9  --   --   --   HGB 12.3  < > 11.2* 11.2*  HCT 37.3  < > 34.7* 34.8*  MCV 78.0  < > 78.0 77.3*  PLT  310  < > 233 255  < > = values in this interval not displayed. Urinalysis:  Recent Labs Lab 10/07/13 0406  COLORURINE YELLOW  LABSPEC 1.025  PHURINE 6.0  GLUCOSEU NEGATIVE  HGBUR SMALL*  BILIRUBINUR NEGATIVE  KETONESUR 15*  PROTEINUR NEGATIVE  UROBILINOGEN 0.2  NITRITE POSITIVE*  LEUKOCYTESUR LARGE*   Micro Results: Recent Results (from the past 240 hour(s))  CULTURE, BLOOD (ROUTINE X 2)     Status: None   Collection Time    10/07/13  2:40 PM      Result Value Ref Range Status   Specimen Description BLOOD RIGHT ARM   Final   Special Requests     Final   Value: BOTTLES DRAWN AEROBIC AND ANAEROBIC 10CC AER,5CC ANA   Culture  Setup Time     Final   Value: 10/07/2013 19:24     Performed at [facility]   Culture     Final   Value:        BLOOD CULTURE RECEIVED NO GROWTH TO DATE CULTURE WILL BE HELD FOR 5 DAYS BEFORE ISSUING A FINAL NEGATIVE REPORT     [facility]formed at [facility]   Report Status PENDING  Incomplete  CULTURE, BLOOD (ROUTINE X 2)     Status: None   Collection Time    10/07/13  2:46 PM      Result Value Ref Range Status   Specimen Description BLOOD RIGHT HAND   Final   Special Requests BOTTLES DRAWN AEROBIC ONLY 10CC   Final   Culture  Setup Time     Final   Value: 10/07/2013 19:25     Performed at [facility]   Culture     Final   Value:        BLOOD CULTURE RECEIVED NO GROWTH TO DATE CULTURE WILL BE HELD FOR 5 DAYS BEFORE ISSUING A FINAL NEGATIVE REPORT     Performed at [facility]   Report Status PENDING   Incomplete  URINE CULTURE     Status: None   Collection Time    10/09/13  2:03 AM      Result Value Ref Range Status   Specimen Description URINE, RANDOM   Final   Special Requests NONE   Final   Culture  Setup Time     Final   Value: 10/09/2013 13:28     Performed at [facility] Count     Final   Value: 30,000 COLONIES/ML     Performed at [facility]   Culture     Final   Value:  ESCHERICHIA COLI     Performed at [facility]   Report Status PENDING   Incomplete   Medications: I have reviewed the patient's current medications. Scheduled Meds: . docusate sodium  100 mg Oral BID  . enoxaparin (LOVENOX) injection  40 mg Subcutaneous Daily  . ketorolac  30 mg Intravenous Once  . piperacillin-tazobactam (ZOSYN)  IV  3.375 g Intravenous Q8H  . polyethylene glycol  17 g Oral BID   Continuous Infusions:   PRN Meds:.  Assessment/Plan: [name] is a 48 y.o. female w/ PMHx of obesity, h/o ileitis, s/p cholecystectomy and appendectomy, admitted for ileitis.  Ileitis- Pain improved today, mild nausea, able to tolerate some clears. Had a bowel movement this AM w/ moderate relief. -Discontinue Zosyn, restart Cipro 400 mg IV q12h + Flagyl 500 mg IV q8h. Will change to po when tolerating po. -Miralax + Colace bid  -Advance diet as tolerated. -Toradol prn for pain at this time, BP still soft.  -Zofran scheduled 4mg  IV q8h x2 days -D/c IVF -Blood cultures pending; negative to date.  Mild Asymptomatic Bacteriuria- Asymptomatic. Urine culture w/ 30,000 colonies of E. Coli. Previously had UTI positive for ESBL E. Coli in 2012.  -Cipro/flagyl as above  DVT/PE PPx- Lovenox [place]  Dispo: Disposition is deferred at this time, awaiting improvement of current medical problems.  Anticipated discharge in approximately 1-2 day(s).   The patient does not have a current PCP (Provider Default, MD) and does not need an [facility] hospital follow-up appointment after discharge.  The patient does not have transportation limitations that hinder transportation to clinic appointments.  Services Needed at time of discharge: Y = Yes, Blank = No PT:   OT:   RN:   Equipment:   Other:     LOS: 4 days   Courtney Paris, MD 10/11/2013, 11:18 AM

## 2013-10-11 NOTE — Progress Notes (Signed)
Subjective: Still with significant abdominal pain.  Objective: Vital signs in last 24 hours: Temp:  [97.9 F (36.6 C)-99.2 F (37.3 C)] 98.9 F (37.2 C) (04/07 0602) Pulse Rate:  [61-81] 62 (04/07 0602) Resp:  [16-18] 18 (04/07 0602) BP: (84-112)/(38-64) 91/46 mmHg (04/07 0602) SpO2:  [94 %-98 %] 94 % (04/07 0602) Last BM Date: 10/06/13  Intake/Output from previous day: 04/06 0701 - 04/07 0700 In: 800 [P.O.:800] Out: 3000 [Urine:3000] Intake/Output this shift:    General appearance: alert and no distress GI: soft, non-tender; bowel sounds normal; no masses,  no organomegaly  Lab Results:  Recent Labs  10/09/13 0946 10/10/13 1101  WBC 4.8 3.0*  HGB 11.0* 11.2*  HCT 35.0* 34.7*  PLT 260 233   BMET  Recent Labs  10/08/13 0746 10/09/13 0946 10/10/13 1101  NA 141 140 142  K 4.4 3.9 3.9  CL 107 105 107  CO2 23 24 23   GLUCOSE 109* 99 93  BUN 9 8 5*  CREATININE 0.79 0.83 0.79  CALCIUM 8.7 8.5 8.3*   LFT  Recent Labs  10/09/13 0946  PROT 6.1  ALBUMIN 2.8*  AST 16  ALT 12  ALKPHOS 66  BILITOT 0.3   PT/INR No results found for this basename: LABPROT, INR,  in the last 72 hours Hepatitis Panel No results found for this basename: HEPBSAG, HCVAB, HEPAIGM, HEPBIGM,  in the last 72 hours C-Diff No results found for this basename: CDIFFTOX,  in the last 72 hours Fecal Lactopherrin No results found for this basename: FECLLACTOFRN,  in the last 72 hours  Studies/Results: Ct Abdomen Pelvis W Contrast  10/09/2013   CLINICAL DATA:  Worsening abdominal pain, fever overnight, ileitis on prior CT  EXAM: CT ABDOMEN AND PELVIS WITH CONTRAST  TECHNIQUE: Multidetector CT imaging of the abdomen and pelvis was performed using the standard protocol following bolus administration of intravenous contrast.  CONTRAST:  OMNIPAQUE IOHEXOL 300 MG/ML  SOLN  COMPARISON:  DG ABD PORTABLE 1V dated 10/09/2013; CT ABD/PELVIS W CM dated 10/07/2013  FINDINGS: Mild areas of increased  density have developed within the lung bases. Peripheral mild linear areas of increased density within the lung bases. Region of mild bronchiectasis within the medial basal portion of the left lower lobe.  The liver, spleen, adrenals, pancreas are unremarkable. Multiple innumerable small bilateral renal cysts are appreciated. Stable 3 mm calculus lower pole left kidney. Kidneys otherwise unremarkable.  Patient status post cholecystectomy.  The segmental area of ileal bowel wall thickening has significantly reduced when compared to the previous study. There is otherwise no evidence of bowel obstruction, nor further regions of enteritis, colitis, or diverticulitis. Small reactive mesenteric lymph nodes are appreciated.  No abdominal or pelvic masses, free fluid, nor loculated fluid collections. No evidence of pneumoperitoneum.  The celiac, SMA, IMA, portal vein, SMV are opacified. There is no evidence of abdominal aortic aneurysm.  No abdominal wall no inguinal hernia appreciated.  No aggressive appearing osseous lesions.  IMPRESSION: 1. Marked improvement in the bowel wall thickening involving the distal ileum no further evidence regions of ileitis. 2. Otherwise no evidence of further focal or acute abnormalities. 3. Stable bilateral annual renal cyst and small 3 mm left medullary calculus 4. ibasilar atelectasis versus mild infiltrates 5. Mild bronchiectasis left lower lobe   Electronically Signed   By: Salome Holmes M.Mcneil.   On: 10/09/2013 14:11   Dg Abd Portable 1v  10/09/2013   CLINICAL DATA:  Abdominal pain  EXAM: PORTABLE ABDOMEN -  1 VIEW  COMPARISON:  10/07/2013  FINDINGS: Scattered large and small bowel gas is identified. No acute abnormality is seen. Fecal material is noted within the colon. No bony abnormality is noted.  IMPRESSION: Nonspecific abdomen.   Electronically Signed   By: Alcide CleverMark  Lukens M.Mcneil.   On: 10/09/2013 11:04    Medications:  Scheduled: . docusate sodium  100 mg Oral BID  . enoxaparin  (LOVENOX) injection  40 mg Subcutaneous Daily  . piperacillin-tazobactam (ZOSYN)  IV  3.375 g Intravenous Q8H  . polyethylene glycol  17 g Oral BID   Continuous: . dextrose 5 % and 0.9% NaCl 100 mL/hr at 10/10/13 1627    Assessment/Plan: 1) Ileitis. 2) ABM pain.   Clinically there is no change despite the improvement in the CT scan.  Hopefully her clinical picture will catch up with her CT scan improvement.  Plan: 1) Continue with pain control. 2) Continue with antibiotics.   LOS: 4 days   Connie Mcneil 10/11/2013, 7:20 AM

## 2013-10-11 NOTE — Progress Notes (Signed)
Patient given and instructed use of incentive spirometer; attained 1000 ml. Encouraged frequent use to achieve 2500 ml goal.

## 2013-10-11 NOTE — Evaluation (Signed)
Physical Therapy Evaluation Patient Details Name: Connie Mcneil MRN: [id number] DOB: 06/04/1966 Today's Date: 10/11/2013   History of Present Illness  48 y.o.female admitted with acute abdomen. Her past medical history is consistent with multiple surgeries, and ileitis. However she reports that the pain she is having currently is 10/10 intensity and she has never had such an episode before  Clinical Impression  Pt's mobility significantly limited by pain. Used RW to ambulate for pain control, which did help. Pt also experiencing left knee pain with resisted flex and ext and notable with ambulation. She reports that this has not been bothering before.  PT will follow pt acutely to increase mobility for pt to return to independence for d/c home. Do not anticipate any f/u PT needs of equipment needs after d/c.    Follow Up Recommendations No PT follow up    Equipment Recommendations  None recommended by PT    Recommendations for Other Services       Precautions / Restrictions Precautions Precautions: None Restrictions Weight Bearing Restrictions: No      Mobility  Bed Mobility Overal bed mobility: Needs Assistance Bed Mobility: Supine to Sit     Supine to sit: Supervision     General bed mobility comments: pt able to get to EOB without physical assist but vc's given for rolling and sitting up from SL to decraese stress on abdomen  Transfers Overall transfer level: Modified independent Equipment used: Rolling walker (2 wheeled)             General transfer comment: RW used only for pain control. vc's for proper use. Pt mod I with transfer from bed and toilet  Ambulation/Gait Ambulation/Gait assistance: Supervision Ambulation Distance (Feet): 100 Feet Assistive device: Rolling walker (2 wheeled) Gait Pattern/deviations: Decreased stride length;Trunk flexed;Antalgic Gait velocity: decreased   General Gait Details: frequent stops due to pain, antalgic gait due to  abdominal pain as well as left knee discomfort. RW used for pain control which did help. Without RW, gait extremely antalgic to the point of decreased safety  Stairs            Wheelchair Mobility    Modified Rankin (Stroke Patients Only)       Balance Overall balance assessment: No apparent balance deficits (not formally assessed)                                           Pertinent Vitals/Pain HR 73 bpm, O2 sats 95% on RA, and BP 107/ 59 after ambulation abd pain 8/10 (faces) with ambulation, emotional support given    Home Living Family/patient expects to be discharged to:: Private residence Living Arrangements: Children Available Help at Discharge: Available PRN/intermittently;Family Type of Home: House Home Access: Stairs to enter Entrance Stairs-Rails: Right Entrance Stairs-Number of Steps: 5 Home Layout: One level Home Equipment: None Additional Comments: pt works in Therapist, sportsproperty management    Prior Function Level of Independence: Independent               Higher education careers adviserHand Dominance        Extremity/Trunk Assessment   Upper Extremity Assessment: Overall WFL for tasks assessed           Lower Extremity Assessment: RLE deficits/detail;LLE deficits/detail RLE Deficits / Details: WFL LLE Deficits / Details: pain in anterior knee with resisted knee ext and flexion, strength 4/5, pt reports no injury to knee, possibly  arthritis  Cervical / Trunk Assessment: Normal  Communication   Communication: No difficulties  Cognition Arousal/Alertness: Awake/alert Behavior During Therapy: WFL for tasks assessed/performed Overall Cognitive Status: Within Functional Limits for tasks assessed                      General Comments      Exercises        Assessment/Plan    PT Assessment Patient needs continued PT services  PT Diagnosis Difficulty walking;Acute pain;Abnormality of gait   PT Problem List Decreased strength;Decreased activity  tolerance;Decreased mobility;Decreased knowledge of use of DME;Pain;Obesity  PT Treatment Interventions DME instruction;Gait training;Stair training;Functional mobility training;Therapeutic activities;Therapeutic exercise;Patient/family education   PT Goals (Current goals can be found in the Care Plan section) Acute Rehab PT Goals Patient Stated Goal: no abdominal pain PT Goal Formulation: With patient Time For Goal Achievement: 10/25/13 Potential to Achieve Goals: Good    Frequency Min 3X/week   Barriers to discharge        Co-evaluation               End of Session   Activity Tolerance: Patient limited by pain Patient left: in bed;with call bell/phone within reach Nurse Communication: Mobility status (BP and BM)         Time: 7829-5621 PT Time Calculation (min): 31 min   Charges:   PT Evaluation $Initial PT Evaluation Tier I: 1 Procedure PT Treatments $Gait Training: 8-22 mins   PT G Codes:        Lyanne Co, PT  Acute Rehab Services  [phone]   Lyanne Co 10/11/2013, 9:19 AM

## 2013-10-11 NOTE — Progress Notes (Signed)
Non-surgical problem. Connie LamasJames O. Gae BonWyatt, III, MD, FACS [phone]--pager [phone]--office [facility]

## 2013-10-11 NOTE — Progress Notes (Signed)
UR COMPLETED  

## 2013-10-11 NOTE — Progress Notes (Signed)
Patient ID: Connie Mcneil, female   DOB: 01/12/1966, 48 y.o.   MRN: [id number]  Subjective: Pain same as yesterday.  Still not ambulating, sat up in a chair.  Last BM on Thursday.  Does not have any pain meds on MAR currently.  Objective:  Vital signs:  Filed Vitals:   10/10/13 1831 10/10/13 2212 10/11/13 0200 10/11/13 0602  BP: 103/61 84/38 110/53 91/46  Pulse: 66 61 66 62  Temp: 99.1 F (37.3 C) 98 F (36.7 C) 99 F (37.2 C) 98.9 F (37.2 C)  TempSrc: Oral Oral Oral Oral  Resp: _0 Height:      Weight:      SpO2: 96% 97% 94% 94%    Last BM Date: 10/06/13  Intake/Output   Yesterday:  04/06 0701 - 04/07 0700 In: 800 [P.O.:800] Out: 3000 [Urine:3000] This shift:    I/O last 3 completed shifts: In: 800 [P.O.:800] Out: 3800 [Urine:3800]    Physical Exam: General: Pt awake/alert/oriented x4 in no acute distress Abdomen: Soft.  Nondistended. Generalized tenderness.  No evidence of peritonitis.  No incarcerated hernias.   Problem List:   Principal Problem:   Ileitis    Results:   Labs: Results for orders placed during the hospital encounter of 10/07/13 (from the past 48 hour(s))  CBC     Status: Abnormal   Collection Time    10/09/13  9:46 AM      Result Value Ref Range   WBC 4.8  4.0 - 10.5 K/uL   RBC 4.41  3.87 - 5.11 MIL/uL   Hemoglobin 11.0 (*) 12.0 - 15.0 g/dL   HCT 35.0 (*) 36.0 - 46.0 %   MCV 79.4  78.0 - 100.0 fL   MCH 24.9 (*) 26.0 - 34.0 pg   MCHC 31.4  30.0 - 36.0 g/dL   RDW 16.2 (*) 11.5 - 15.5 %   Platelets 260  150 - 400 K/uL  COMPREHENSIVE METABOLIC PANEL     Status: Abnormal   Collection Time    10/09/13  9:46 AM      Result Value Ref Range   Sodium 140  137 - 147 mEq/L   Potassium 3.9  3.7 - 5.3 mEq/L   Chloride 105  96 - 112 mEq/L   CO2 24  19 - 32 mEq/L   Glucose, Bld 99  70 - 99 mg/dL   BUN 8  6 - 23 mg/dL   Creatinine, Ser 0.83  0.50 - 1.10 mg/dL   Calcium 8.5  8.4 - 10.5 mg/dL   Total Protein 6.1  6.0 - 8.3 g/dL    Albumin 2.8 (*) 3.5 - 5.2 g/dL   AST 16  0 - 37 U/L   ALT 12  0 - 35 U/L   Alkaline Phosphatase 66  39 - 117 U/L   Total Bilirubin 0.3  0.3 - 1.2 mg/dL   GFR calc non Af Amer 81 (*) >90 mL/min   GFR calc Af Amer >90  >90 mL/min   Comment: (NOTE)     The eGFR has been calculated using the CKD EPI equation.     This calculation has not been validated in all clinical situations.     eGFR's persistently <90 mL/min signify possible Chronic Kidney     Disease.  OCCULT BLOOD X 1 CARD TO LAB, STOOL     Status: None   Collection Time    10/09/13 10:27 AM      Result Value Ref Range  Fecal Occult Bld NEGATIVE  NEGATIVE  LIPASE, BLOOD     Status: None   Collection Time    10/09/13 12:41 PM      Result Value Ref Range   Lipase 12  11 - 59 U/L  LACTIC ACID, PLASMA     Status: None   Collection Time    10/10/13 10:00 AM      Result Value Ref Range   Lactic Acid, Venous 0.7  0.5 - 2.2 mmol/L  BASIC METABOLIC PANEL     Status: Abnormal   Collection Time    10/10/13 11:01 AM      Result Value Ref Range   Sodium 142  137 - 147 mEq/L   Potassium 3.9  3.7 - 5.3 mEq/L   Chloride 107  96 - 112 mEq/L   CO2 23  19 - 32 mEq/L   Glucose, Bld 93  70 - 99 mg/dL   BUN 5 (*) 6 - 23 mg/dL   Creatinine, Ser 0.79  0.50 - 1.10 mg/dL   Calcium 8.3 (*) 8.4 - 10.5 mg/dL   GFR calc non Af Amer >90  >90 mL/min   GFR calc Af Amer >90  >90 mL/min   Comment: (NOTE)     The eGFR has been calculated using the CKD EPI equation.     This calculation has not been validated in all clinical situations.     eGFR's persistently <90 mL/min signify possible Chronic Kidney     Disease.  CBC     Status: Abnormal   Collection Time    10/10/13 11:01 AM      Result Value Ref Range   WBC 3.0 (*) 4.0 - 10.5 K/uL   RBC 4.45  3.87 - 5.11 MIL/uL   Hemoglobin 11.2 (*) 12.0 - 15.0 g/dL   HCT 34.7 (*) 36.0 - 46.0 %   MCV 78.0  78.0 - 100.0 fL   MCH 25.2 (*) 26.0 - 34.0 pg   MCHC 32.3  30.0 - 36.0 g/dL   RDW 15.9 (*) 11.5 -  15.5 %   Platelets 233  150 - 400 K/uL    Imaging / Studies: Ct Abdomen Pelvis W Contrast  10/09/2013   CLINICAL DATA:  Worsening abdominal pain, fever overnight, ileitis on prior CT  EXAM: CT ABDOMEN AND PELVIS WITH CONTRAST  TECHNIQUE: Multidetector CT imaging of the abdomen and pelvis was performed using the standard protocol following bolus administration of intravenous contrast.  CONTRAST:  165m OMNIPAQUE IOHEXOL 300 MG/ML  SOLN  COMPARISON:  DG ABD PORTABLE 1V dated 10/09/2013; CT ABD/PELVIS W CM dated 10/07/2013  FINDINGS: Mild areas of increased density have developed within the lung bases. Peripheral mild linear areas of increased density within the lung bases. Region of mild bronchiectasis within the medial basal portion of the left lower lobe.  The liver, spleen, adrenals, pancreas are unremarkable. Multiple innumerable small bilateral renal cysts are appreciated. Stable 3 mm calculus lower pole left kidney. Kidneys otherwise unremarkable.  Patient status post cholecystectomy.  The segmental area of ileal bowel wall thickening has significantly reduced when compared to the previous study. There is otherwise no evidence of bowel obstruction, nor further regions of enteritis, colitis, or diverticulitis. Small reactive mesenteric lymph nodes are appreciated.  No abdominal or pelvic masses, free fluid, nor loculated fluid collections. No evidence of pneumoperitoneum.  The celiac, SMA, IMA, portal vein, SMV are opacified. There is no evidence of abdominal aortic aneurysm.  No abdominal wall no inguinal hernia appreciated.  No aggressive appearing osseous lesions.  IMPRESSION: 1. Marked improvement in the bowel wall thickening involving the distal ileum no further evidence regions of ileitis. 2. Otherwise no evidence of further focal or acute abnormalities. 3. Stable bilateral annual renal cyst and small 3 mm left medullary calculus 4. ibasilar atelectasis versus mild infiltrates 5. Mild bronchiectasis left  lower lobe   Electronically Signed   By: Margaree Mackintosh M.D.   On: 10/09/2013 14:11   Dg Abd Portable 1v  10/09/2013   CLINICAL DATA:  Abdominal pain  EXAM: PORTABLE ABDOMEN - 1 VIEW  COMPARISON:  10/07/2013  FINDINGS: Scattered large and small bowel gas is identified. No acute abnormality is seen. Fecal material is noted within the colon. No bony abnormality is noted.  IMPRESSION: Nonspecific abdomen.   Electronically Signed   By: Inez Catalina M.D.   On: 10/09/2013 11:04    Medications / Allergies: per chart  Antibiotics: Anti-infectives   Start     Dose/Rate Route Frequency Ordered Stop   10/09/13 1800  metroNIDAZOLE (FLAGYL) IVPB 500 mg  Status:  Discontinued     500 mg 100 mL/hr over 60 Minutes Intravenous Every 8 hours 10/09/13 1121 10/09/13 1409   10/09/13 1130  piperacillin-tazobactam (ZOSYN) IVPB 3.375 g     3.375 g 12.5 mL/hr over 240 Minutes Intravenous Every 8 hours 10/09/13 1119     10/07/13 1000  metroNIDAZOLE (FLAGYL) IVPB 500 mg  Status:  Discontinued     500 mg 100 mL/hr over 60 Minutes Intravenous Every 8 hours 10/07/13 0927 10/09/13 1120   10/07/13 1000  ciprofloxacin (CIPRO) IVPB 400 mg  Status:  Discontinued     400 mg 200 mL/hr over 60 Minutes Intravenous Every 12 hours 10/07/13 0927 10/09/13 1105   10/07/13 0630  metroNIDAZOLE (FLAGYL) IVPB 500 mg     500 mg 100 mL/hr over 60 Minutes Intravenous  Once 10/07/13 0620 10/07/13 0741   10/07/13 0630  levofloxacin (LEVAQUIN) IVPB 500 mg     500 mg 100 mL/hr over 60 Minutes Intravenous  Once 10/07/13 9924 10/07/13 0843       Assessment/Plan:  Acute ileitis Non surgical abdomen She needs to be mobilized and pain control Further treatment plan per GI and primary team Surgery signing off at this time, please do not hesitate to contact [facility] with questions or concerns.   Erby Pian, [facility] Pager [phone] Office [phone]  10/11/2013 8:01 AM

## 2013-10-12 DIAGNOSIS — E669 Obesity, unspecified: Secondary | ICD-10-CM

## 2013-10-12 MED ORDER — METRONIDAZOLE 500 MG PO TABS
500.0000 mg | ORAL_TABLET | Freq: Three times a day (TID) | ORAL | Status: DC
  Administered 2013-10-12 – 2013-10-13 (×4): 500 mg via ORAL
  Filled 2013-10-12 (×6): qty 1

## 2013-10-12 MED ORDER — CIPROFLOXACIN HCL 500 MG PO TABS
500.0000 mg | ORAL_TABLET | Freq: Two times a day (BID) | ORAL | Status: DC
  Administered 2013-10-12 – 2013-10-13 (×3): 500 mg via ORAL
  Filled 2013-10-12 (×5): qty 1

## 2013-10-12 NOTE — Progress Notes (Signed)
Unassigned patient Subjective: Since I last evaluated the patient, she claims she is better but still rates the pain at 9.5/10 in intensity. She has had 3 BM's yesterday. She denies any associated fever or chills. Tolerating clears well.    Objective: Vital signs in last 24 hours: Temp:  [98.1 F (36.7 C)-99.3 F (37.4 C)] 98.5 F (36.9 C) (04/08 1358) Pulse Rate:  [61-73] 73 (04/08 1358) Resp:  [18-20] 20 (04/08 1358) BP: (101-106)/(48-56) 103/53 mmHg (04/08 1358) SpO2:  [96 %-100 %] 100 % (04/08 1358) Last BM Date: 10/12/13  Intake/Output from previous day: 04/07 0701 - 04/08 0700 In: 1941.7 [I.V.:1241.7; IV Piggyback:700] Out: -  Intake/Output this shift:   General appearance: alert, cooperative, appears stated age, no distress and moderately obese Resp: clear to auscultation bilaterally Cardio: regular rate and rhythm, S1, S2 normal, no murmur, click, rub or gallop GI: soft, obese, diffusely tender on palaption; bowel sounds normal; no masses,  no organomegaly Extremities: extremities normal, atraumatic, no cyanosis or edema  Lab Results:  Recent Labs  10/10/13 1101 10/11/13 0755  WBC 3.0* 3.2*  HGB 11.2* 11.2*  HCT 34.7* 34.8*  PLT 233 255   BMET  Recent Labs  10/10/13 1101 10/11/13 0755  NA 142 143  K 3.9 3.7  CL 107 108  CO2 23 22  GLUCOSE 93 101*  BUN 5* 5*  CREATININE 0.79 0.79  CALCIUM 8.3* 8.4   Medications: I have reviewed the patient's current medications.  Assessment/Plan: ?Infectious ileitis: continue antibiotics for now. She should be encouraged to ambulate. Minimize narcotic use.   LOS: 5 days   Charna ElizabethJyothi Amarii Bordas 10/12/2013, 5:52 PM

## 2013-10-12 NOTE — Progress Notes (Signed)
Subjective: Patient seen and examined at bedside. Still with some mild nausea and abdominal pain. Has had 3 loose stools since yesterday. Patient claims her pain is improved since yesterday, appetite also improving. Denies any other issues.   Objective: Vital signs in last 24 hours: Filed Vitals:   10/11/13 2045 10/12/13 0316 10/12/13 0613 10/12/13 1043  BP: 106/56 103/54 104/50 101/48  Pulse: 70 63 62 61  Temp: 99.1 F (37.3 C) 98.1 F (36.7 C) 99.3 F (37.4 C) 99 F (37.2 C)  TempSrc: Oral Oral Oral Oral  Resp: 18 18 18 20   Height:      Weight:      SpO2: 96% 98% 99% 99%   Weight change:   Intake/Output Summary (Last 24 hours) at 10/12/13 1122 Last data filed at 10/12/13 0408  Gross per 24 hour  Intake 1941.67 ml  Output      0 ml  Net 1941.67 ml   Physical Exam: General: Awake, alert, cooperative, NAD. HEENT: EOMI, PERRL. Lungs: Clear to ascultation bilaterally, normal work of respiration, no wheezes, rales, rhonchi Heart: RRR. No murmurs, gallops, rubs. Abdomen: Soft, moderately tender diffusely, non-distended, BS +. No rebound or guarding. Improved from yesterday. Extremities: No cyanosis, clubbing, moving all 4 extremities. Trace pitting edema. Neurologic: Alert & oriented X3, sensation grossly intact, no focal motor abnormalities.   Lab Results: Basic Metabolic Panel:  Recent Labs Lab 10/10/13 1101 10/11/13 0755  NA 142 143  K 3.9 3.7  CL 107 108  CO2 23 22  GLUCOSE 93 101*  BUN 5* 5*  CREATININE 0.79 0.79  CALCIUM 8.3* 8.4   Liver Function Tests:  Recent Labs Lab 10/07/13 0030 10/09/13 0946  AST 16 16  ALT 16 12  ALKPHOS 75 66  BILITOT 0.3 0.3  PROT 7.1 6.1  ALBUMIN 3.5 2.8*    Recent Labs Lab 10/07/13 0509 10/09/13 1241  LIPASE 21 12   CBC:  Recent Labs Lab 10/07/13 0030  10/10/13 1101 10/11/13 0755  WBC 4.4  < > 3.0* 3.2*  NEUTROABS 1.9  --   --   --   HGB 12.3  < > 11.2* 11.2*  HCT 37.3  < > 34.7* 34.8*  MCV 78.0  < >  78.0 77.3*  PLT 310  < > 233 255  < > = values in this interval not displayed. Urinalysis:  Recent Labs Lab 10/07/13 0406  COLORURINE YELLOW  LABSPEC 1.025  PHURINE 6.0  GLUCOSEU NEGATIVE  HGBUR SMALL*  BILIRUBINUR NEGATIVE  KETONESUR 15*  PROTEINUR NEGATIVE  UROBILINOGEN 0.2  NITRITE POSITIVE*  LEUKOCYTESUR LARGE*   Micro Results: Recent Results (from the past 240 hour(s))  CULTURE, BLOOD (ROUTINE X 2)     Status: None   Collection Time    10/07/13  2:40 PM      Result Value Ref Range Status   Specimen Description BLOOD RIGHT ARM   Final   Special Requests     Final   Value: BOTTLES DRAWN AEROBIC AND ANAEROBIC 10CC AER,5CC ANA   Culture  Setup Time     Final   Value: 10/07/2013 19:24     [facility]formed [facility]   Culture     Final   Value:        BLOOD CULTURE RECEIVED NO GROWTH TO DATE CULTURE WILL BE HELD FOR 5 DAYS BEFORE ISSUING A FINAL NEGATIVE REPORT     Performed at [facility]   Report Status PENDING   Incomplete  CULTURE, BLOOD (ROUTINE X 2)     Status: None   Collection Time    10/07/13  2:46 PM      Result Value Ref Range Status   Specimen Description BLOOD RIGHT HAND   Final   Special Requests BOTTLES DRAWN AEROBIC ONLY 10CC   Final   Culture  Setup Time     Final   Value: 10/07/2013 19:25     Performed at [facility]   Culture     Final   Value:        BLOOD CULTURE RECEIVED NO GROWTH TO DATE CULTURE WILL BE HELD FOR 5 DAYS BEFORE ISSUING A FINAL NEGATIVE REPORT     Performed at [facility]   Report Status PENDING   Incomplete  URINE CULTURE     Status: None   Collection Time    10/09/13  2:03 AM      Result Value Ref Range Status   Specimen Description URINE, RANDOM   Final   Special Requests NONE   Final   Culture  Setup Time     Final   Value: 10/09/2013 13:28     Performed at [facility] Count     Final   Value: 30,000 COLONIES/ML     Performed at [facility]   Culture      Final   Value: ESCHERICHIA COLI     Note: Confirmed Extended Spectrum Beta-Lactamase Producer (ESBL) CRITICAL RESULT CALLED TO, READ BACK BY AND VERIFIED WITH: MARRISA @416PM  4.7.15 BUONO     Performed at [facility]   Report Status 10/11/2013 FINAL   Final   Organism ID, Bacteria ESCHERICHIA COLI   Final   Medications: I have reviewed the patient's current medications. Scheduled Meds: . ciprofloxacin  400 mg Intravenous BID  . enoxaparin (LOVENOX) injection  40 mg Subcutaneous Daily  . metronidazole  500 mg Intravenous Q8H   Assessment/Plan: [name] is a 48 y.o. female w/ PMHx of obesity, h/o ileitis, s/p cholecystectomy and appendectomy, admitted for ileitis.  Ileitis- Pain is significantly improved on exam today, still with mild nausea and some loose stools since yesterday.  -Continue Cipro 400 mg IV q12h + Flagyl 500 mg IV q8h (day 6/10). -Hold Miralax + Colace in setting of diarrhea -Advance to full liquids and as tolerated today -Toradol prn for pain at this time, BP still soft.  -Zofran 4 mg IV q8h prn -D/c IVF, trace pitting edema on exam today -Blood cultures pending; negative to date.  Mild Asymptomatic Bacteriuria- Asymptomatic. Urine culture w/ 30,000 colonies of ESBL E. Coli. -Continue to monitor for symptoms.  DVT/PE PPx- Lovenox [place]  Dispo: Disposition is deferred at this time, awaiting improvement of current medical problems.  Anticipated discharge in approximately 1-2 day(s).   The patient does not have a current PCP (Provider Default, MD) and does not need an [facility] hospital follow-up appointment after discharge.  The patient does not have transportation limitations that hinder transportation to clinic appointments.  Services Needed at time of discharge: Y = Yes, Blank = No PT:   OT:   RN:   Equipment:   Other:     LOS: 5 days   Courtney Paris, MD 10/12/2013, 11:22 AM

## 2013-10-13 LAB — CULTURE, BLOOD (ROUTINE X 2)
Culture: NO GROWTH
Culture: NO GROWTH

## 2013-10-13 LAB — GI PATHOGEN PANEL BY PCR, STOOL
C difficile toxin A/B: NEGATIVE
Campylobacter by PCR: NEGATIVE
Cryptosporidium by PCR: NEGATIVE
E coli (ETEC) LT/ST: NEGATIVE
E coli (STEC): NEGATIVE
E coli 0157 by PCR: NEGATIVE
G lamblia by PCR: NEGATIVE
Norovirus GI/GII: NEGATIVE
Rotavirus A by PCR: POSITIVE
Salmonella by PCR: NEGATIVE
Shigella by PCR: NEGATIVE

## 2013-10-13 MED ORDER — CIPROFLOXACIN HCL 500 MG PO TABS: 500.0000 mg | ORAL_TABLET | Freq: Two times a day (BID) | ORAL | Status: DC

## 2013-10-13 MED ORDER — ONDANSETRON HCL 4 MG PO TABS: 4.0000 mg | ORAL_TABLET | Freq: Three times a day (TID) | ORAL | Status: DC | PRN

## 2013-10-13 MED ORDER — METRONIDAZOLE 500 MG PO TABS: 500.0000 mg | ORAL_TABLET | Freq: Three times a day (TID) | ORAL | Status: DC

## 2013-10-13 NOTE — Discharge Summary (Signed)
Name: Connie Mcneil MRN: [id number] DOB: [birth year] 48 y.o. PCP: Provider Default, MD  Date of Admission: 10/07/2013  2:56 AM Date of Discharge: 10/13/2013 Attending Physician: Bartholomew Crews, MD  Discharge Diagnosis: 1. Ileitis 2. UTI  Discharge Medications:   Medication List    STOP taking these medications       ALEVE PO      TAKE these medications       ciprofloxacin 500 MG tablet  Commonly known as:  CIPRO  Take 1 tablet (500 mg total) by mouth 2 (two) times daily.     metroNIDAZOLE 500 MG tablet  Commonly known as:  FLAGYL  Take 1 tablet (500 mg total) by mouth every 8 (eight) hours.     multivitamin with minerals Tabs tablet  Take 1 tablet by mouth daily.     ondansetron 4 MG tablet  Commonly known as:  ZOFRAN  Take 1 tablet (4 mg total) by mouth every 8 (eight) hours as needed for nausea or vomiting.       Disposition and follow-up:   [name] was discharged from [facility] in Stable condition.  At the hospital follow up visit please address:  1.  Abdominal pain; is pain completely resolved? No further nausea or vomiting?   2.  Labs / imaging needed at time of follow-up: none  3.  Pending labs/ test needing follow-up: none  Follow-up Appointments:     Follow-up Information   Follow up with Cresenciano Genre, MD On 10/26/2013. (1:45 PM)    Specialty:  Internal Medicine   Contact information:   [address] [place] [address] [phone]      Discharge Instructions: Discharge Orders   Future Appointments Provider Department Dept Phone   10/26/2013 1:45 PM Cresenciano Genre, MD [place] [phone]   Future Orders Complete By Expires   Call MD for:  persistant nausea and vomiting  As directed    Call MD for:  severe uncontrolled pain  As directed    Increase activity slowly  As directed      Consultations: Treatment Team:  Juanita Craver, MD  Procedures Performed:  Ct Abdomen  Pelvis W Contrast  10/09/2013   CLINICAL DATA:  Worsening abdominal pain, fever overnight, ileitis on prior CT  EXAM: CT ABDOMEN AND PELVIS WITH CONTRAST  TECHNIQUE: Multidetector CT imaging of the abdomen and pelvis was performed using the standard protocol following bolus administration of intravenous contrast.  CONTRAST:  173m OMNIPAQUE IOHEXOL 300 MG/ML  SOLN  COMPARISON:  DG ABD PORTABLE 1V dated 10/09/2013; CT ABD/PELVIS W CM dated 10/07/2013  FINDINGS: Mild areas of increased density have developed within the lung bases. Peripheral mild linear areas of increased density within the lung bases. Region of mild bronchiectasis within the medial basal portion of the left lower lobe.  The liver, spleen, adrenals, pancreas are unremarkable. Multiple innumerable small bilateral renal cysts are appreciated. Stable 3 mm calculus lower pole left kidney. Kidneys otherwise unremarkable.  Patient status post cholecystectomy.  The segmental area of ileal bowel wall thickening has significantly reduced when compared to the previous study. There is otherwise no evidence of bowel obstruction, nor further regions of enteritis, colitis, or diverticulitis. Small reactive mesenteric lymph nodes are appreciated.  No abdominal or pelvic masses, free fluid, nor loculated fluid collections. No evidence of pneumoperitoneum.  The celiac, SMA, IMA, portal vein, SMV are opacified. There is no evidence of abdominal aortic aneurysm.  No abdominal  wall no inguinal hernia appreciated.  No aggressive appearing osseous lesions.  IMPRESSION: 1. Marked improvement in the bowel wall thickening involving the distal ileum no further evidence regions of ileitis. 2. Otherwise no evidence of further focal or acute abnormalities. 3. Stable bilateral annual renal cyst and small 3 mm left medullary calculus 4. ibasilar atelectasis versus mild infiltrates 5. Mild bronchiectasis left lower lobe   Electronically Signed   By: Margaree Mackintosh M.D.   On: 10/09/2013  14:11   Ct Abdomen Pelvis W Contrast  10/07/2013   CLINICAL DATA:  Lower abdominal cramping, nausea and vomiting.  EXAM: CT ABDOMEN AND PELVIS WITH CONTRAST  TECHNIQUE: Multidetector CT imaging of the abdomen and pelvis was performed using the standard protocol following bolus administration of intravenous contrast.  CONTRAST:  1m OMNIPAQUE IOHEXOL 300 MG/ML  SOLN  COMPARISON:  CT of the abdomen and pelvis performed 07/04/2012  FINDINGS: The visualized lung bases are clear. Mild left lower lobe bronchiectasis is noted, with mild associated atelectasis.  The liver and spleen are unremarkable in appearance. The patient is status post cholecystectomy, with clips noted at the gallbladder fossa. The pancreas and adrenal glands are unremarkable.  Innumerable scattered bilateral renal cysts are seen. A 3 mm stone is noted at the lower pole of the left kidney. The kidneys are otherwise unremarkable. There is no evidence of hydronephrosis. No obstructing ureteral stones are seen. No perinephric stranding is appreciated.  A relatively long segment of wall thickening is noted along the mid to distal ileum, without evidence for obstruction. Mild associated soft tissue inflammation is seen. Findings are concerning for infectious or inflammatory ileitis.  The stomach is within normal limits. No acute vascular abnormalities are seen.  The appendix is not definitely seen; there is no evidence of appendicitis. The colon is unremarkable in appearance.  The bladder is mildly distended and grossly unremarkable. No inguinal lymphadenopathy is seen.  No acute osseous abnormalities are identified.  IMPRESSION: 1. Relatively long segment of wall thickening along the mid to distal ileum, without evidence for obstruction. Mild associated soft tissue inflammation seen. Findings concerning for infectious or inflammatory ileitis. The more distal ileum is decompressed and grossly unremarkable. 2. Innumerable scattered bilateral renal cysts  seen. 3 mm nonobstructing stone noted at the lower pole of the left kidney. 3. Mild left lower lobe bronchiectasis noted, with mild associated atelectasis.   Electronically Signed   By: JGarald BaldingM.D.   On: 10/07/2013 05:48   Dg Abd Portable 1v  10/09/2013   CLINICAL DATA:  Abdominal pain  EXAM: PORTABLE ABDOMEN - 1 VIEW  COMPARISON:  10/07/2013  FINDINGS: Scattered large and small bowel gas is identified. No acute abnormality is seen. Fecal material is noted within the colon. No bony abnormality is noted.  IMPRESSION: Nonspecific abdomen.   Electronically Signed   By: MInez CatalinaM.D.   On: 10/09/2013 11:04   Admission HPI:  Ms. SKaoru Bendais a 48y.o. female w/ PMHx of obesity, h/o ileitis, s/p cholecystectomy and appendectomy, presents to the ED w/ complaints of abdominal pain, nausea, and vomiting. Patient claims symptoms started yesterday w/ abdominal pain that she claims started as a nagging pain, centrally located, which became progressively worse. Last night she tried to eat some food to see if it helped with the pain, but she claims her pain remained unchanged, 10/10 in severity. She also took some Aleve which she says may have made the pain worse. Last night, after she ate, she says  that she had an episode of vomiting (NB/NB). She claims she has never had this sort of pain before. She otherwise denies fever, chills, diarrhea, recent sick contact or travel history. She also denies SOB, chest pain, dizziness, lightheadedness, or palpitations. She also denies any recent dysuria, hematuria, increased frequency, or urgency. The patient claims she had some loose stools last week, but otherwise denies diarrhea.  Patient has a h/o previous cholecystectomy, appendectomy, and hysterectomy, all dating back to the 1990's. She denies any h/o smoking, alcohol, or drug abuse.   Hospital Course by problem list:   1. Ileitis- Presented w/ nausea, vomiting, and significant abdominal pain. Denies sick  contacts or possibility of food-borne illness, or recent travel. Claims she has never had this before. Pain very severe during this admission, given Morphine 4 mg x2 + Dilaudid 1 mg in ED. CT abdomen showed a long segment of wall thickening along the mid to distal ileum, without evidence for obstruction and mild associated soft tissue inflammation. These findings are suggestive of infectious vs inflammatory ileitis. BMP showed normal electrolytes, no significant AG (12). Do not suspect ischemic colitis given absence of AG. Lipase 21 on admission, no h/o alcohol abuse and patient s/p cholecystectomy several years ago, therefore pancreatitis unlikely. Given Levaquin + Flagyl in ED. Continued on Cipro + Flagyl, IVF, and Dilaudid 1 mg prn q2h. ESR and CRP found to be wnl. Patient failed to improve symptomatically and had low grade fever of 100.5, changed to Zosyn on 10/09/13. CT abdomen/pelvis from 10/09/13 showed marked improvement in ileitis. Surgery followed, felt that patient did not have acute abdomen. Patient w/ mild HoTN starting on 10/10/13, discontinued IV narcotics, switched to prn toradol for pain control. Pain slowly decreased, patient improved symptomatically, felt comfortable to return home on 10/13/13.   UTI vs asymptomatic bacteriuria- Patient denies h/o dysuria, hematuria, increased frequency, urgency, or flank pain, however, given severity of abdominal pain, may also be related. UA in ED showed nitrites, large leukocytes, few squames, many bacteria, and 15 ketones. No leukocytosis. Patient also shown to have calcium oxalate crystals + CT abdomen shows 3 mm stone noted at the lower pole of the left kidney. Urine culture revealed 30,000 colonies ESBL E. Coli, and continued to not report urinary symptoms other than abdominal pain. Cipro + Flagyl as above.  Discharge Vitals:   BP 110/60  Pulse 57  Temp(Src) 98.2 F (36.8 C) (Oral)  Resp 18  Ht _0  (1.626 m)  Wt 269 lb 4.8 oz (122.154 kg)  BMI 46.20  kg/m2  SpO2 97%  Discharge Labs:  No results found for this or any previous visit (from the past 24 hour(s)).  Signed: Corky Sox, MD 10/13/2013, 1:45 PM   Time Spent on Discharge: 35 minutes Services Ordered on Discharge: none Equipment Ordered on Discharge: none

## 2013-10-13 NOTE — Progress Notes (Signed)
Subjective: Patient seen and examined at bedside this AM. Says she is feeling better. Had some mild nausea this morning, but says her pain has improved some from yesterday. Denies any vomiting. No recent diarrhea, saying her last BM was last night. No fever or chills.   Objective: Vital signs in last 24 hours: Filed Vitals:   10/13/13 0148 10/13/13 0254 10/13/13 0655 10/13/13 1038  BP: 95/48 98/64 81/41  110/60  Pulse: 61  63 57  Temp: 98.7 F (37.1 C)  98.6 F (37 C) 98.2 F (36.8 C)  TempSrc: Oral  Oral Oral  Resp: 20  20 18   Height:      Weight:      SpO2: 97%  96% 97%   Weight change:   Intake/Output Summary (Last 24 hours) at 10/13/13 1128 Last data filed at 10/13/13 0900  Gross per 24 hour  Intake    400 ml  Output      0 ml  Net    400 ml   Physical Exam: General: Awake, alert, cooperative, NAD. HEENT: EOMI, PERRL. Lungs: Clear to ascultation bilaterally, normal work of respiration, no wheezes, rales, rhonchi Heart: RRR. No murmurs, gallops, rubs. Abdomen: Soft, mild tenderness diffusely, non-distended, BS +. No rebound or guarding. Improved from yesterday. Extremities: No cyanosis, clubbing, moving all 4 extremities. No pitting edema. Neurologic: Alert & oriented X3, sensation grossly intact, no focal motor abnormalities.   Lab Results: Basic Metabolic Panel:  Recent Labs Lab 10/10/13 1101 10/11/13 0755  NA 142 143  K 3.9 3.7  CL 107 108  CO2 23 22  GLUCOSE 93 101*  BUN 5* 5*  CREATININE 0.79 0.79  CALCIUM 8.3* 8.4   Liver Function Tests:  Recent Labs Lab 10/07/13 0030 10/09/13 0946  AST 16 16  ALT 16 12  ALKPHOS 75 66  BILITOT 0.3 0.3  PROT 7.1 6.1  ALBUMIN 3.5 2.8*    Recent Labs Lab 10/07/13 0509 10/09/13 1241  LIPASE 21 12   CBC:  Recent Labs Lab 10/07/13 0030  10/10/13 1101 10/11/13 0755  WBC 4.4  < > 3.0* 3.2*  NEUTROABS 1.9  --   --   --   HGB 12.3  < > 11.2* 11.2*  HCT 37.3  < > 34.7* 34.8*  MCV 78.0  < > 78.0  77.3*  PLT 310  < > 233 255  < > = values in this interval not displayed. Urinalysis:  Recent Labs Lab 10/07/13 0406  COLORURINE YELLOW  LABSPEC 1.025  PHURINE 6.0  GLUCOSEU NEGATIVE  HGBUR SMALL*  BILIRUBINUR NEGATIVE  KETONESUR 15*  PROTEINUR NEGATIVE  UROBILINOGEN 0.2  NITRITE POSITIVE*  LEUKOCYTESUR LARGE*   Micro Results: Recent Results (from the past 240 hour(s))  CULTURE, BLOOD (ROUTINE X 2)     Status: None   Collection Time    10/07/13  2:40 PM      Result Value Ref Range Status   Specimen Description BLOOD RIGHT ARM   Final   Special Requests     Final   Value: BOTTLES DRAWN AEROBIC AND ANAEROBIC 10CC AER,5CC ANA   Culture  Setup Time     Final   Value: 10/07/2013 19:24     [facility]formed at [facility]   Culture     Final   Value: NO GROWTH 5 DAYS     Performed at [facility]   Report Status 10/13/2013 FINAL   Final  CULTURE, BLOOD (ROUTINE X 2)     Status:  None   Collection Time    10/07/13  2:46 PM      Result Value Ref Range Status   Specimen Description BLOOD RIGHT HAND   Final   Special Requests BOTTLES DRAWN AEROBIC ONLY 10CC   Final   Culture  Setup Time     Final   Value: 10/07/2013 19:25     Performed at [facility]   Culture     Final   Value: NO GROWTH 5 DAYS     Performed at [facility]   Report Status 10/13/2013 FINAL   Final  URINE CULTURE     Status: None   Collection Time    10/09/13  2:03 AM      Result Value Ref Range Status   Specimen Description URINE, RANDOM   Final   Special Requests NONE   Final   Culture  Setup Time     Final   Value: 10/09/2013 13:28     Performed at [facility] Count     Final   Value: 30,000 COLONIES/ML     Performed at [facility]   Culture     Final   Value: ESCHERICHIA COLI     Note: Confirmed Extended Spectrum Beta-Lactamase Producer (ESBL) CRITICAL RESULT CALLED TO, READ BACK BY AND VERIFIED WITH: MARRISA @416PM  4.7.15 BUONO      Performed at [facility]   Report Status 10/11/2013 FINAL   Final   Organism ID, Bacteria ESCHERICHIA COLI   Final   Medications: I have reviewed the patient's current medications. Scheduled Meds: . ciprofloxacin  500 mg Oral BID  . enoxaparin (LOVENOX) injection  40 mg Subcutaneous Daily  . metroNIDAZOLE  500 mg Oral 3 times per day   Assessment/Plan: [name] is a 47 y.o. female w/ PMHx of obesity, h/o ileitis, s/p cholecystectomy and appendectomy, admitted for ileitis.  Ileitis- Pain improved on exam today, still with mild nausea this AM. No longer having diarrhea, last BM yesterday evening. BP improved this AM. -Continue Cipro 400 mg IV q12h + Flagyl 500 mg IV q8h (day 7/10). -Advance to regular diet. -Zofran 4 mg IV q8h prn -Blood cultures negative.  Mild Asymptomatic Bacteriuria- Asymptomatic. Urine culture w/ 30,000 colonies of ESBL E. Coli. -Continue to monitor for symptoms.  DVT/PE PPx- Lovenox [name]  Dispo: Anticipated discharge today.  The patient does not have a current PCP (Provider Default, MD) and does not need an [facility] hospital follow-up appointment after discharge.  The patient does not have transportation limitations that hinder transportation to clinic appointments.  Services Needed at time of discharge: Y = Yes, Blank = No PT:   OT:   RN:   Equipment:   Other:     LOS: 6 days   Courtney Paris, MD 10/13/2013, 11:28 AM

## 2013-10-13 NOTE — Progress Notes (Signed)
Subjective: Feeling better.  Her pain is tolerable.  Objective: Vital signs in last 24 hours: Temp:  [97.8 F (36.6 C)-99 F (37.2 C)] 97.8 F (36.6 C) (04/09 1443) Pulse Rate:  [57-79] 79 (04/09 1443) Resp:  [18-20] 18 (04/09 1443) BP: (81-110)/(41-64) 108/42 mmHg (04/09 1443) SpO2:  [96 %-98 %] 97 % (04/09 1443) Last BM Date: 10/12/13  Intake/Output from previous day:   Intake/Output this shift: Total I/O In: 740 [P.O.:740] Out: -   General appearance: alert and no distress GI: tender to palpation diffusely, but improving.  Lab Results:  Recent Labs  10/11/13 0755  WBC 3.2*  HGB 11.2*  HCT 34.8*  PLT 255   BMET  Recent Labs  10/11/13 0755  NA 143  K 3.7  CL 108  CO2 22  GLUCOSE 101*  BUN 5*  CREATININE 0.79  CALCIUM 8.4   LFT No results found for this basename: PROT, ALBUMIN, AST, ALT, ALKPHOS, BILITOT, BILIDIR, IBILI,  in the last 72 hours PT/INR No results found for this basename: LABPROT, INR,  in the last 72 hours Hepatitis Panel No results found for this basename: HEPBSAG, HCVAB, HEPAIGM, HEPBIGM,  in the last 72 hours C-Diff No results found for this basename: CDIFFTOX,  in the last 72 hours Fecal Lactopherrin No results found for this basename: FECLLACTOFRN,  in the last 72 hours  Studies/Results: No results found.  Medications:  Scheduled: . ciprofloxacin  500 mg Oral BID  . enoxaparin (LOVENOX) injection  40 mg Subcutaneous Daily  . metroNIDAZOLE  500 mg Oral 3 times per day   Continuous:   Assessment/Plan: 1) Ileitis. 2) Diffuse abdominal pain.   Clinically she is improving, but very slowly.  No new recommendations.  Plan: 1) Continue with antibiotics. 2) Advance diet as tolerated. 3) Continue with pain medications. 4) Signing off.   LOS: 6 days   Connie Mcneil 10/13/2013, 4:10 PM

## 2013-10-13 NOTE — Discharge Instructions (Signed)
1. Please schedule a follow up appointment as follows:  Connie Gularacy N McLean, MD  On 10/26/2013 @ 1:45 PM  [address] [place] [id number] [phone]  2. Please take all medications as prescribed. Please take Flagyl + Cipro for 3 more days as directed.   Please return to the ED if pain becomes severe, and nausea and vomiting worsen.  3. If you have worsening of your symptoms or new symptoms arise, please call the clinic [phone]), or go to the ER immediately if symptoms are severe.  Abdominal Pain, Adult Many things can cause abdominal pain. Usually, abdominal pain is not caused by a disease and will improve without treatment. It can often be observed and treated at home. Your health care provider will do a physical exam and possibly order blood tests and X-rays to help determine the seriousness of your pain. However, in many cases, more time must pass before a clear cause of the pain can be found. Before that point, your health care provider may not know if you need more testing or further treatment. HOME CARE INSTRUCTIONS  Monitor your abdominal pain for any changes. The following actions may help to alleviate any discomfort you are experiencing:  Only take over-the-counter or prescription medicines as directed by your health care provider.  Do not take laxatives unless directed to do so by your health care provider.  Try a clear liquid diet (broth, tea, or water) as directed by your health care provider. Slowly move to a bland diet as tolerated. SEEK MEDICAL CARE IF:  You have unexplained abdominal pain.  You have abdominal pain associated with nausea or diarrhea.  You have pain when you urinate or have a bowel movement.  You experience abdominal pain that wakes you in the night.  You have abdominal pain that is worsened or improved by eating food.  You have abdominal pain that is worsened with eating fatty foods. SEEK IMMEDIATE MEDICAL CARE IF:   Your pain does not go  away within 2 hours.  You have a fever.  You keep throwing up (vomiting).  Your pain is felt only in portions of the abdomen, such as the right side or the left lower portion of the abdomen.  You pass bloody or black tarry stools. MAKE SURE YOU:  Understand these instructions.   Will watch your condition.   Will get help right away if you are not doing well or get worse.  Document Released: 04/02/2005 Document Revised: 04/13/2013 Document Reviewed: 03/02/2013 [facility] Patient Information 2014 [employer] [place].

## 2013-10-26 ENCOUNTER — Encounter: Payer: Self-pay | Admitting: Internal Medicine

## 2013-10-26 ENCOUNTER — Ambulatory Visit (HOSPITAL_COMMUNITY)
Admission: RE | Admit: 2013-10-26 | Discharge: 2013-10-26 | Disposition: A | Discharge status: Home / Self Care | Payer: BC Managed Care – PPO | Source: Ambulatory Visit | Attending: Oncology | Admitting: Oncology

## 2013-10-26 ENCOUNTER — Ambulatory Visit (INDEPENDENT_AMBULATORY_CARE_PROVIDER_SITE_OTHER): Payer: BC Managed Care – PPO | Admitting: Internal Medicine

## 2013-10-26 VITALS — BP 120/76 | HR 76 | Temp 97.1°F | Ht 64.0 in | Wt 265.5 lb

## 2013-10-26 DIAGNOSIS — M25562 Pain in left knee: Secondary | ICD-10-CM

## 2013-10-26 DIAGNOSIS — E669 Obesity, unspecified: Secondary | ICD-10-CM

## 2013-10-26 DIAGNOSIS — M25569 Pain in unspecified knee: Secondary | ICD-10-CM | POA: Insufficient documentation

## 2013-10-26 DIAGNOSIS — K5289 Other specified noninfective gastroenteritis and colitis: Secondary | ICD-10-CM

## 2013-10-26 DIAGNOSIS — K529 Noninfective gastroenteritis and colitis, unspecified: Secondary | ICD-10-CM

## 2013-10-26 LAB — BASIC METABOLIC PANEL WITH GFR
BUN: 10 mg/dL (ref 6–23)
CO2: 22 mEq/L (ref 19–32)
Calcium: 9 mg/dL (ref 8.4–10.5)
Chloride: 107 mEq/L (ref 96–112)
Creat: 0.71 mg/dL (ref 0.50–1.10)
GFR, Est African American: >89 mL/min
GFR, Est Non African American: >89 mL/min
Glucose, Bld: 84 mg/dL (ref 70–99)
Potassium: 4.5 mEq/L (ref 3.5–5.3)
Sodium: 137 mEq/L (ref 135–145)

## 2013-10-26 LAB — CBC WITH DIFFERENTIAL/PLATELET
Basophils Absolute: 0.1 10*3/uL (ref 0.0–0.1)
Basophils Relative: 2 % — ABNORMAL HIGH (ref 0–1)
Eosinophils Absolute: 0.3 10*3/uL (ref 0.0–0.7)
Eosinophils Relative: 7 % — ABNORMAL HIGH (ref 0–5)
HCT: 37.1 % (ref 36.0–46.0)
Hemoglobin: 12 g/dL (ref 12.0–15.0)
Lymphocytes Relative: 53 % — ABNORMAL HIGH (ref 12–46)
Lymphs Abs: 2 10*3/uL (ref 0.7–4.0)
MCH: 24.8 pg — ABNORMAL LOW (ref 26.0–34.0)
MCHC: 32.3 g/dL (ref 30.0–36.0)
MCV: 76.7 fL — ABNORMAL LOW (ref 78.0–100.0)
Monocytes Absolute: 0.3 10*3/uL (ref 0.1–1.0)
Monocytes Relative: 7 % (ref 3–12)
Neutro Abs: 1.1 10*3/uL — ABNORMAL LOW (ref 1.7–7.7)
Neutrophils Relative %: 31 % — ABNORMAL LOW (ref 43–77)
Platelets: 304 10*3/uL (ref 150–400)
RBC: 4.84 MIL/uL (ref 3.87–5.11)
RDW: 17 % — ABNORMAL HIGH (ref 11.5–15.5)
WBC: 3.7 10*3/uL — ABNORMAL LOW (ref 4.0–10.5)

## 2013-10-26 NOTE — Assessment & Plan Note (Signed)
Disc'ed exercise to lose Pt will reschedule with nutritionist to disc diet

## 2013-10-26 NOTE — Progress Notes (Signed)
   Subjective:    Patient ID: Connie Mcneil, female    DOB: 08/18/1965, 48 y.o.   MRN: [id number]  HPI Comments: 48 y.o Past Medical History Obesity, E.coli UTI (asymptomatic), Ileitis, b/l renal cysts.   She presents for f/u for: 1) HFU for ileitis and UTI (E.coli resistent to Cipro) she was discharge with Cipro, Flagyl for ileitis. GI pathogen panel + rotavirus. She completed Cipro/Flagyl on 4/13.  She is still having nausea (not taking Zofran), generalized (crampy) ab pain 6/10 today (she states she can tolerated and is not taking medication for pain), she denies vomiting, she is tolerating oral  2) Left knee pain radiating down left leg. She has been limping x 2 weeks and feeling like her leg is going to given out.  She states pain is constant and she cant put pressure on her left leg. Pain is at left knee cap and she has trouble with range of motion and feels like her knee is going to give way.  Pain is 10/10. She has not tried meds b/c she does not like taking medications                                                            Review of Systems  Constitutional: Negative for fever, chills and appetite change.       Tolerating diet   Respiratory:       Sob with exertion   Cardiovascular: Negative for chest pain.  Gastrointestinal: Positive for nausea and abdominal pain. Negative for vomiting.       Soft stool  Genitourinary: Negative for dysuria.  Musculoskeletal: Positive for arthralgias.       Objective:   Physical Exam  Nursing note and vitals reviewed. Constitutional: She is oriented to person, place, and time. Vital signs are normal. She appears well-developed and well-nourished. She is cooperative. No distress.  HENT:  Head: Normocephalic and atraumatic.  Mouth/Throat: Oropharynx is clear and moist and mucous membranes are normal. No oropharyngeal exudate.  Eyes: Conjunctivae are normal. Pupils are equal, round, and reactive to light. Right eye exhibits no discharge.  Left eye exhibits no discharge. No scleral icterus.  Cardiovascular: Normal rate, regular rhythm, S1 normal, S2 normal and normal heart sounds.   No murmur heard. Trace lower ext edema   Pulmonary/Chest: Effort normal and breath sounds normal. No respiratory distress. She has no wheezes.  Abdominal: Soft. Bowel sounds are normal. There is tenderness.  Generalized mild ttp   Musculoskeletal:       Left knee: Tenderness found. Lateral joint line tenderness noted. No medial joint line tenderness noted.  Mild to moderate ttp   Neurological: She is alert and oriented to person, place, and time.  Limping   Skin: Skin is warm, dry and intact. No rash noted. She is not diaphoretic.  Psychiatric: She has a normal mood and affect. Her speech is normal and behavior is normal. Judgment and thought content normal. Cognition and memory are normal.          Assessment & Plan:  F/u in 1 mo prn, otherwise 4-6 months

## 2013-10-26 NOTE — Progress Notes (Signed)
Attending physician note: I reviewed the presenting complaints, problem list, physical findings, and medication list with resident physician Dr. Arbutus Pedraci McLean and concur with her management. Cephas DarbyJames Colter Magowan, MD, FACP  Hematology-Oncology?Internal Medicine

## 2013-10-26 NOTE — Assessment & Plan Note (Signed)
Will check BMET, CBC today  Refer to GI for further w/u if needed  Etiology may have been 2/2 rotavirus but other etiologies need to be excluded as well

## 2013-10-26 NOTE — Patient Instructions (Addendum)
General Instructions: Follow up in 4-6 months, sooner if needed  Take care  Please follow up with gastroenterology  Get an Xray of your knee today    Treatment Goals:  Goals (1 Years of Data) as of 10/26/13   None      Progress Toward Treatment Goals:  No flowsheet data found.  Self Care Goals & Plans:  No flowsheet data found.  No flowsheet data found.   Care Management & Community Referrals:  Referral 10/26/2013  Referrals made to community resources none      Exercise to Lose Weight Exercise and a healthy diet may help you lose weight. Your doctor may suggest specific exercises. EXERCISE IDEAS AND TIPS  Choose low-cost things you enjoy doing, such as walking, bicycling, or exercising to workout videos.  Take stairs instead of the elevator.  Walk during your lunch break.  Park your car further away from work or school.  Go to a gym or an exercise class.  Start with 5 to 10 minutes of exercise each day. Build up to 30 minutes of exercise 4 to 6 days a week.  Wear shoes with good support and comfortable clothes.  Stretch before and after working out.  Work out until you breathe harder and your heart beats faster.  Drink extra water when you exercise.  Do not do so much that you hurt yourself, feel dizzy, or get very short of breath. Exercises that burn about 150 calories:  Running 1  miles in 15 minutes.  Playing volleyball for 45 to 60 minutes.  Washing and waxing a car for 45 to 60 minutes.  Playing touch football for 45 minutes.  Walking 1  miles in 35 minutes.  Pushing a stroller 1  miles in 30 minutes.  Playing basketball for 30 minutes.  Raking leaves for 30 minutes.  Bicycling 5 miles in 30 minutes.  Walking 2 miles in 30 minutes.  Dancing for 30 minutes.  Shoveling snow for 15 minutes.  Swimming laps for 20 minutes.  Walking up stairs for 15 minutes.  Bicycling 4 miles in 15 minutes.  Gardening for 30 to 45  minutes.  Jumping rope for 15 minutes.  Washing windows or floors for 45 to 60 minutes. Document Released: 07/26/2010 Document Revised: 09/15/2011 Document Reviewed: 07/26/2010 [facility] Patient Information 2014 [place], [place].  Abdominal Pain, Adult Many things can cause abdominal pain. Usually, abdominal pain is not caused by a disease and will improve without treatment. It can often be observed and treated at home. Your health care provider will do a physical exam and possibly order blood tests and X-rays to help determine the seriousness of your pain. However, in many cases, more time must pass before a clear cause of the pain can be found. Before that point, your health care provider may not know if you need more testing or further treatment. HOME CARE INSTRUCTIONS  Monitor your abdominal pain for any changes. The following actions may help to alleviate any discomfort you are experiencing:  Only take over-the-counter or prescription medicines as directed by your health care provider.  Do not take laxatives unless directed to do so by your health care provider.  Try a clear liquid diet (broth, tea, or water) as directed by your health care provider. Slowly move to a bland diet as tolerated. SEEK MEDICAL CARE IF:  You have unexplained abdominal pain.  You have abdominal pain associated with nausea or diarrhea.  You have pain when you urinate or have a bowel  movement.  You experience abdominal pain that wakes you in the night.  You have abdominal pain that is worsened or improved by eating food.  You have abdominal pain that is worsened with eating fatty foods. SEEK IMMEDIATE MEDICAL CARE IF:   Your pain does not go away within 2 hours.  You have a fever.  You keep throwing up (vomiting).  Your pain is felt only in portions of the abdomen, such as the right side or the left lower portion of the abdomen.  You pass bloody or black tarry stools. MAKE SURE YOU:  Understand  these instructions.   Will watch your condition.   Will get help right away if you are not doing well or get worse.  Document Released: 04/02/2005 Document Revised: 04/13/2013 Document Reviewed: 03/02/2013 [facility] Patient Information 2014 [place], [place].  Rotavirus Infection Rotaviruses are a group of viruses that cause acute stomach and bowel upset (gastroenteritis) in all ages. Rotavirus infection may also be called infantile diarrhea, winter diarrhea, acute nonbacterial infectious gastroenteritis, and acute viral gastroenteritis. It occurs especially in young children. Children 6 months to 452 years of age, premature infants, the elderly, and the immunocompromised are more likely to have severe symptoms.  CAUSES  Rotaviruses are transmitted by the fecal-oral route. This means the virus is spread by eating or drinking food or water that is contaminated with infected stool. The virus is most commonly spread from person to person when somone's hands are contaminated with infected stool. For example, infected food handlers may contaminate foods. This can occur with foods that require handling and no further cooking, such as salads, fruits, and hors d'oeuvres. Rotaviruses are quite stable. They can be hard to control and eliminate in water supplies. Rotaviruses are a common cause of infection and diarrhea in child-care settings. SYMPTOMS  Some children have no symptoms. The period after infection but before symptoms begin (incubation period) ranges from 1 to 3 days. Symptoms usually begin with vomiting. Diarrhea follows for 4 to 8 days. Other symptoms may include:  Low-grade fever.  Temporary dairy (lactose) intolerance.  Cough.  Runny nose. DIAGNOSIS  The disease is diagnosed by identifying the virus in the stool. A person with rotavirus diarrhea often has large numbers of viruses in his or her stool. TREATMENT  There is no cure for rotavirus infection. Most people develop an immune  response that eventually gets rid of the virus. While this natural response develops, the virus can make you very ill. The majority of people affected are young infants, so the disease can be dangerous. The most common symptom is diarrhea. Diarrhea alone can cause severe dehydration. It can also cause an electrolyte imbalance. Treatments are aimed at rehydration. Rehydration treatment can prevent the severe effects of dehydration. Antidiarrheal medicines are not recommended. Such medicines may prolong the infection, since they prevent you from passing the viruses out of your body. Severe diarrhea without fluid and electrolyte replacement may be life-threatening. HOME CARE INSTRUCTIONS Ask your caregiver for specific rehydration instructions. SEEK IMMEDIATE MEDICAL CARE IF:   There is decreased urination.  You have a dry mouth, tongue, or lips.  You notice decreased tears or sunken eyes.  You have dry skin.  Your breathing is fast.  Your fingertip takes more than 2 seconds to turn pink again after a gentle squeeze.  There is blood in your vomit or stool.  Your abdomen is enlarged (distended) or very tender.  There is persistent vomiting. Most of this information is courtesy of the  Center for Disease Control and Prevention of Food Illness Fact Sheet. Document Released: 06/23/2005 Document Revised: 09/15/2011 Document Reviewed: 09/19/2010 [facility] Patient Information 2014 [place], [place].  Knee Pain Knee pain can be a result of an injury or other medical conditions. Treatment will depend on the cause of your pain. HOME CARE  Only take medicine as told by your doctor.  Keep a healthy weight. Being overweight can make the knee hurt more.  Stretch before exercising or playing sports.  If there is constant knee pain, change the way you exercise. Ask your doctor for advice.  Make sure shoes fit well. Choose the right shoe for the sport or activity.  Protect your knees. Wear kneepads  if needed.  Rest when you are tired. GET HELP RIGHT AWAY IF:   Your knee pain does not stop.  Your knee pain does not get better.  Your knee joint feels hot to the touch.  You have a fever. MAKE SURE YOU:   Understand these instructions.  Will watch this condition.  Will get help right away if you are not doing well or get worse. Document Released: 09/19/2008 Document Revised: 09/15/2011 Document Reviewed: 09/19/2008 [facility] Patient Information 2014 [place], [place].

## 2013-10-26 NOTE — Assessment & Plan Note (Addendum)
Could be osteoarthritis due to body habitus  Can take Tylenol prn for pain  Will Xray today  Given information to read Consider sports med referral, PT

## 2013-10-27 ENCOUNTER — Encounter: Payer: Self-pay | Admitting: Internal Medicine

## 2013-10-27 NOTE — Addendum Note (Signed)
Addended by: Annett GulaMCLEAN, Gerene Nedd N on: 10/27/2013 09:08 AM   Modules accepted: Orders

## 2013-11-16 ENCOUNTER — Encounter: Payer: Self-pay | Admitting: *Deleted

## 2013-12-19 NOTE — Addendum Note (Signed)
Addended by: Neomia DearPOWERS, Makaila Windle E on: 12/19/2013 02:32 PM   Modules accepted: Orders

## 2014-05-15 ENCOUNTER — Encounter: Payer: Self-pay | Admitting: Internal Medicine

## 2014-05-15 ENCOUNTER — Ambulatory Visit: Payer: BC Managed Care – PPO | Admitting: Internal Medicine

## 2014-06-28 ENCOUNTER — Ambulatory Visit: Payer: BC Managed Care – PPO | Admitting: Internal Medicine

## 2014-07-04 ENCOUNTER — Ambulatory Visit: Payer: BC Managed Care – PPO | Admitting: Internal Medicine

## 2014-07-13 ENCOUNTER — Ambulatory Visit (INDEPENDENT_AMBULATORY_CARE_PROVIDER_SITE_OTHER): Payer: BLUE CROSS/BLUE SHIELD | Admitting: Internal Medicine

## 2014-07-13 ENCOUNTER — Ambulatory Visit (INDEPENDENT_AMBULATORY_CARE_PROVIDER_SITE_OTHER): Payer: BLUE CROSS/BLUE SHIELD | Admitting: *Deleted

## 2014-07-13 ENCOUNTER — Encounter: Payer: Self-pay | Admitting: Internal Medicine

## 2014-07-13 ENCOUNTER — Encounter: Payer: BC Managed Care – PPO | Admitting: Internal Medicine

## 2014-07-13 VITALS — BP 133/67 | HR 78 | Temp 97.8°F | Ht 64.0 in | Wt 282.0 lb

## 2014-07-13 DIAGNOSIS — Z23 Encounter for immunization: Secondary | ICD-10-CM | POA: Diagnosis not present

## 2014-07-13 DIAGNOSIS — Z6841 Body Mass Index (BMI) 40.0 and over, adult: Secondary | ICD-10-CM

## 2014-07-13 DIAGNOSIS — E669 Obesity, unspecified: Secondary | ICD-10-CM | POA: Diagnosis not present

## 2014-07-13 DIAGNOSIS — R112 Nausea with vomiting, unspecified: Secondary | ICD-10-CM

## 2014-07-13 DIAGNOSIS — Z0000 Encounter for general adult medical examination without abnormal findings: Secondary | ICD-10-CM | POA: Diagnosis not present

## 2014-07-13 DIAGNOSIS — M25531 Pain in right wrist: Secondary | ICD-10-CM | POA: Diagnosis not present

## 2014-07-13 LAB — COMPLETE METABOLIC PANEL WITH GFR
ALT: 18 U/L (ref 0–35)
AST: 18 U/L (ref 0–37)
Albumin: 4 g/dL (ref 3.5–5.2)
Alkaline Phosphatase: 90 U/L (ref 39–117)
BUN: 10 mg/dL (ref 6–23)
CO2: 26 mEq/L (ref 19–32)
Calcium: 9.6 mg/dL (ref 8.4–10.5)
Chloride: 109 mEq/L (ref 96–112)
Creat: 0.77 mg/dL (ref 0.50–1.10)
GFR, Est African American: >89 mL/min
GFR, Est Non African American: >89 mL/min
Glucose, Bld: 83 mg/dL (ref 70–99)
Potassium: 4.7 mEq/L (ref 3.5–5.3)
Sodium: 145 mEq/L (ref 135–145)
Total Bilirubin: 0.8 mg/dL (ref 0.2–1.2)
Total Protein: 7.3 g/dL (ref 6.0–8.3)

## 2014-07-13 LAB — LIPID PANEL
Cholesterol: 200 mg/dL (ref 0–200)
HDL: 70 mg/dL (ref >39)
LDL Cholesterol: 118 mg/dL — ABNORMAL HIGH (ref 0–99)
Total CHOL/HDL Ratio: 2.9 Ratio
Triglycerides: 58 mg/dL (ref <150)
VLDL: 12 mg/dL (ref 0–40)

## 2014-07-13 MED ORDER — PANTOPRAZOLE SODIUM 40 MG PO TBEC: 40.0000 mg | DELAYED_RELEASE_TABLET | Freq: Every day | ORAL | Status: DC

## 2014-07-13 MED ORDER — IBUPROFEN 600 MG PO TABS: 600.0000 mg | ORAL_TABLET | Freq: Four times a day (QID) | ORAL | Status: DC | PRN

## 2014-07-13 NOTE — Assessment & Plan Note (Addendum)
TAH, so no need for PAP. -Flu shot. -Tdap. -Referral to GI for colonoscopy. -[facility]

## 2014-07-13 NOTE — Assessment & Plan Note (Signed)
Symptoms are not specific.  I suspect this may be related to GERD given her obesity.  In addition, ginger ale appears to help her symptoms. -Trial of Protonix 40 mg daily for one month. -Check CMP.

## 2014-07-13 NOTE — Patient Instructions (Addendum)
Thank you for coming to clinic today [name].  General instructions: -Your nausea may be due to reflux.  I would like to try you on Protonix to see if it helps with your symptoms. -I would also like to get an x-ray of your right wrist to make sure it is not injured. -To help with your wrist pain, you can take ibuprofen 600 mg every 6 hours as needed. -We will call you with you appointment for your colonoscopy and mammogram. -Please make a follow up appointment to return to clinic in 1 month to follow up your nausea and wrist pain.  Please bring your medicines with you each time you come.   Medicines may be  Eye drops  Herbal   Vitamins  Pills  Seeing these help us take care of you.

## 2014-07-13 NOTE — Assessment & Plan Note (Addendum)
Filed Weights   07/13/14 1547  Weight: 282 lb (127.914 kg)   Body mass index is 48.38 kg/(m^2).  Patient is severely obese.  She reports gaining weight recently, but she is motivated to lose weight to improve her health.  I think this would be helpful for her nausea as well. -[facility]. -Screening lipid panel.

## 2014-07-13 NOTE — Assessment & Plan Note (Signed)
Etiology unclear.  Potentially related not recognized previous injury vs. Osteoarthritis.  Only one joint involved, but diffusely tender with swelling. -Wrist x-ray. -Ibuprofen 600 mg q6h PRN.

## 2014-07-13 NOTE — Progress Notes (Signed)
Internal Medicine Clinic Attending  Case discussed with Dr. Moding at the time of the visit.  We reviewed the resident's history and exam and pertinent patient test results.  I agree with the assessment, diagnosis, and plan of care documented in the resident's note. 

## 2014-07-13 NOTE — Progress Notes (Signed)
   Subjective:    Patient ID: Connie Mcneil, female    DOB: 02/11/1966, 49 y.o.   MRN: [id number]  HPI Connie AmyShebra Lindner is a 49 year old woman with history of obesity, ileitis, and bilateral renal cysts.  She reports doing great overall. She reports having some nausea in October that resolved after a week or so, but she has been having the nausea since Christmas.  She notices the nausea when she starts to eat.  It is not related to specific foods.  Drinking ginger ale helps to some extent.  She reports vomiting 3 or 4 times, but she doesn't usually vomit.  She has not noticed any changes in her bowel movements.  She denies dysphagia, feeling like food is stuck in her throat, burning, abdominal pain, fevers, chills, light-headedness, chest pain, dyspnea.  She also reports right wrist pain for the past few months associated with swelling of her hand.  Her wrist is very painful, and she occasionally has difficulty holding objects. She denies history of trauma.  She takes Tylenol with some relief.  Review of Systems  Constitutional: Negative for fever, chills and activity change.  HENT: Negative for congestion, rhinorrhea and sore throat.   Respiratory: Negative for cough, chest tightness and shortness of breath.   Cardiovascular: Negative for chest pain and palpitations.  Gastrointestinal: Positive for nausea and vomiting. Negative for abdominal pain, diarrhea and constipation.  Genitourinary: Negative for dysuria and difficulty urinating.  Musculoskeletal: Positive for myalgias, joint swelling and arthralgias.  Skin: Negative for rash.  Neurological: Negative for dizziness, weakness, light-headedness and numbness.  Psychiatric/Behavioral: Negative for agitation.       Objective:   Physical Exam  Constitutional: She is oriented to person, place, and time. She appears well-developed and well-nourished. No distress.  HENT:  Head: Normocephalic and atraumatic.  Eyes: Conjunctivae and EOM are  normal. Pupils are equal, round, and reactive to light. No scleral icterus.  Neck: Normal range of motion. Neck supple.  Cardiovascular: Normal rate, regular rhythm and normal heart sounds.   Pulmonary/Chest: Effort normal and breath sounds normal. No respiratory distress.  Abdominal: Soft. Bowel sounds are normal. She exhibits no distension. There is no tenderness.  Musculoskeletal: She exhibits edema (Right hand.) and tenderness (Diffuse tenderness of R wrist.).  Neurological: She is alert and oriented to person, place, and time. No cranial nerve deficit. She exhibits normal muscle tone.  Skin: Skin is warm and dry. No rash noted. No erythema.          Assessment & Plan:  Please see problem-based assessment and plan.

## 2014-07-14 ENCOUNTER — Encounter: Payer: Self-pay | Admitting: Internal Medicine

## 2014-07-19 ENCOUNTER — Encounter: Payer: Self-pay | Admitting: Pulmonary Disease

## 2014-07-26 ENCOUNTER — Ambulatory Visit (HOSPITAL_COMMUNITY): Payer: BLUE CROSS/BLUE SHIELD

## 2014-08-01 ENCOUNTER — Ambulatory Visit (HOSPITAL_COMMUNITY): Payer: BLUE CROSS/BLUE SHIELD | Attending: Internal Medicine

## 2014-08-03 ENCOUNTER — Encounter: Payer: Self-pay | Admitting: *Deleted

## 2014-08-06 ENCOUNTER — Other Ambulatory Visit: Payer: Self-pay | Admitting: Internal Medicine

## 2014-08-07 NOTE — Telephone Encounter (Signed)
This was started Jan for N/V assumed GERD and 1 month F/U sch to assess response. She cancelled F/U appt,. Refill 30 days and ask appt sch Feb to see if med needs to be cont.

## 2014-08-19 ENCOUNTER — Encounter (HOSPITAL_COMMUNITY): Payer: Self-pay

## 2014-08-19 ENCOUNTER — Emergency Department (INDEPENDENT_AMBULATORY_CARE_PROVIDER_SITE_OTHER): Payer: BLUE CROSS/BLUE SHIELD

## 2014-08-19 ENCOUNTER — Emergency Department (HOSPITAL_COMMUNITY)
Admission: EM | Admit: 2014-08-19 | Discharge: 2014-08-19 | Disposition: A | Discharge status: Home / Self Care | Payer: BLUE CROSS/BLUE SHIELD | Source: Home / Self Care | Attending: Family Medicine | Admitting: Family Medicine

## 2014-08-19 DIAGNOSIS — J069 Acute upper respiratory infection, unspecified: Secondary | ICD-10-CM

## 2014-08-19 MED ORDER — CEFTRIAXONE SODIUM 1 G IJ SOLR
INTRAMUSCULAR | Status: AC
  Filled 2014-08-19: qty 10

## 2014-08-19 MED ORDER — AZITHROMYCIN 250 MG PO TABS: ORAL_TABLET | ORAL | Status: DC

## 2014-08-19 MED ORDER — LIDOCAINE HCL (PF) 1 % IJ SOLN
INTRAMUSCULAR | Status: AC
  Filled 2014-08-19: qty 5

## 2014-08-19 MED ORDER — CEFTRIAXONE SODIUM 1 G IJ SOLR
1.0000 g | Freq: Once | INTRAMUSCULAR | Status: AC
  Administered 2014-08-19: 1 g via INTRAMUSCULAR

## 2014-08-19 NOTE — ED Notes (Signed)
C/o 3 week duration of cough, congestion, hurts to cough. ?Decreased breath sounds on left? States her cough is dry , non-productive

## 2014-08-19 NOTE — ED Provider Notes (Signed)
CSN: [id number]     Arrival date & time 08/19/14  [id number] History   First MD Initiated Contact with Patient 08/19/14 1003     Chief Complaint  Patient presents with  . Cough   (Consider location/radiation/quality/duration/timing/severity/associated sxs/prior Treatment) Patient is a 49 y.o. female presenting with cough. The history is provided by the patient.  Cough Cough characteristics:  Non-productive, harsh and dry Severity:  Moderate Onset quality:  Gradual Duration:  3 weeks Progression:  Worsening Chronicity:  New Smoker: no   Associated symptoms: chills, fever and shortness of breath     Past Medical History  Diagnosis Date  . Obesity   . Ileitis     10/2013   Past Surgical History  Procedure Laterality Date  . Cholecystectomy    . Appendectomy    . Total abdominal hysterectomy     Family History  Problem Relation Age of Onset  . Diabetes Mother   . Heart disease Mother     chf  . Hypertension Mother    History  Substance Use Topics  . Smoking status: Never Smoker   . Smokeless tobacco: Not on file  . Alcohol Use: No   OB History    No data available     Review of Systems  Constitutional: Positive for fever and chills.  Respiratory: Positive for cough and shortness of breath.   Gastrointestinal: Positive for nausea. Negative for vomiting and diarrhea.    Allergies  Review of patient's allergies indicates no known allergies.  Home Medications   Prior to Admission medications   Medication Sig Start Date End Date Taking? Authorizing Provider  azithromycin (ZITHROMAX Z-PAK) 250 MG tablet Take as directed on pack 08/19/14   Linna HoffJames D Jennavie Martinek, MD  ibuprofen (ADVIL,MOTRIN) 600 MG tablet Take 1 tablet (600 mg total) by mouth every 6 (six) hours as needed. 07/13/14 07/13/15  Luisa DagoEverett Moding, MD  Multiple Vitamin (MULTIVITAMIN WITH MINERALS) TABS Take 1 tablet by mouth daily.    Historical Provider, MD  ondansetron (ZOFRAN) 4 MG tablet Take 1 tablet (4 mg total) by  mouth every 8 (eight) hours as needed for nausea or vomiting. 10/13/13   Courtney ParisEden W Jones, MD  pantoprazole (PROTONIX) 40 MG tablet TAKE 1 TABLET (40 MG TOTAL) BY MOUTH DAILY. 08/07/14   Burns SpainElizabeth A Butcher, MD   BP 111/79 mmHg  Pulse 67  Temp(Src) 98.4 F (36.9 C) (Oral)  Resp 12  SpO2 97% Physical Exam  Constitutional: She is oriented to person, place, and time. She appears well-developed and well-nourished. She appears distressed.  HENT:  Right Ear: External ear normal.  Left Ear: External ear normal.  Mouth/Throat: Oropharynx is clear and moist.  Neck: Normal range of motion. Neck supple.  Cardiovascular: Regular rhythm and normal heart sounds.   Pulmonary/Chest: She has decreased breath sounds. She has rhonchi. She has no rales.  Abdominal: Soft. Bowel sounds are normal.  Lymphadenopathy:    She has no cervical adenopathy.  Neurological: She is alert and oriented to person, place, and time.  Skin: Skin is warm and dry.  Nursing note and vitals reviewed.   ED Course  Procedures (including critical care time) Labs Review Labs Reviewed - No data to display  Imaging Review Dg Chest 2 View  08/19/2014   CLINICAL DATA:  Cough for 3 weeks.  EXAM: CHEST  2 VIEW  COMPARISON:  None.  FINDINGS: Heart is upper limits normal in size. There are no focal consolidations or pleural effusions. No pulmonary edema.  IMPRESSION:  No active cardiopulmonary disease.   Electronically Signed   By: Norva Pavlov M.D.   On: 08/19/2014 10:23    X-rays reviewed and report per radiologist.  MDM   1. URI (upper respiratory infection)        Linna Hoff, MD 08/19/14 1215

## 2014-08-19 NOTE — Discharge Instructions (Signed)
Drink plenty of fluids as discussed, use medicine as prescribed, and mucinex or delsym for cough. Return or see your doctor if further problems °

## 2014-08-22 NOTE — Addendum Note (Signed)
Addended by: Neomia DearPOWERS, Tyce Delcid E on: 08/22/2014 05:40 PM   Modules accepted: Orders

## 2014-08-23 ENCOUNTER — Encounter (HOSPITAL_COMMUNITY): Payer: Self-pay | Admitting: Emergency Medicine

## 2014-08-23 ENCOUNTER — Encounter (HOSPITAL_COMMUNITY): Payer: Self-pay

## 2014-08-23 ENCOUNTER — Emergency Department (HOSPITAL_COMMUNITY): Payer: BLUE CROSS/BLUE SHIELD

## 2014-08-23 ENCOUNTER — Emergency Department (HOSPITAL_COMMUNITY)
Admission: EM | Admit: 2014-08-23 | Discharge: 2014-08-24 | Disposition: A | Discharge status: Home / Self Care | Payer: BLUE CROSS/BLUE SHIELD | Attending: Emergency Medicine | Admitting: Emergency Medicine

## 2014-08-23 ENCOUNTER — Emergency Department (HOSPITAL_COMMUNITY)
Admission: EM | Admit: 2014-08-23 | Discharge: 2014-08-23 | Disposition: A | Discharge status: Other Acute Inpatient Hospital | Payer: BLUE CROSS/BLUE SHIELD | Source: Home / Self Care

## 2014-08-23 DIAGNOSIS — E669 Obesity, unspecified: Secondary | ICD-10-CM | POA: Diagnosis not present

## 2014-08-23 DIAGNOSIS — N281 Cyst of kidney, acquired: Secondary | ICD-10-CM | POA: Diagnosis not present

## 2014-08-23 DIAGNOSIS — Z79899 Other long term (current) drug therapy: Secondary | ICD-10-CM | POA: Insufficient documentation

## 2014-08-23 DIAGNOSIS — R059 Cough, unspecified: Secondary | ICD-10-CM

## 2014-08-23 DIAGNOSIS — Z9049 Acquired absence of other specified parts of digestive tract: Secondary | ICD-10-CM | POA: Insufficient documentation

## 2014-08-23 DIAGNOSIS — R05 Cough: Secondary | ICD-10-CM

## 2014-08-23 DIAGNOSIS — R0600 Dyspnea, unspecified: Secondary | ICD-10-CM

## 2014-08-23 DIAGNOSIS — Z8719 Personal history of other diseases of the digestive system: Secondary | ICD-10-CM | POA: Diagnosis not present

## 2014-08-23 DIAGNOSIS — R1032 Left lower quadrant pain: Secondary | ICD-10-CM

## 2014-08-23 DIAGNOSIS — Z90710 Acquired absence of both cervix and uterus: Secondary | ICD-10-CM | POA: Insufficient documentation

## 2014-08-23 DIAGNOSIS — J471 Bronchiectasis with (acute) exacerbation: Secondary | ICD-10-CM | POA: Diagnosis not present

## 2014-08-23 LAB — CBC WITH DIFFERENTIAL/PLATELET
Basophils Absolute: 0 10*3/uL (ref 0.0–0.1)
Basophils Relative: 1 % (ref 0–1)
Eosinophils Absolute: 0.3 10*3/uL (ref 0.0–0.7)
Eosinophils Relative: 6 % — ABNORMAL HIGH (ref 0–5)
HCT: 36.8 % (ref 36.0–46.0)
Hemoglobin: 11.9 g/dL — ABNORMAL LOW (ref 12.0–15.0)
Lymphocytes Relative: 47 % — ABNORMAL HIGH (ref 12–46)
Lymphs Abs: 2.5 10*3/uL (ref 0.7–4.0)
MCH: 24.5 pg — ABNORMAL LOW (ref 26.0–34.0)
MCHC: 32.3 g/dL (ref 30.0–36.0)
MCV: 75.9 fL — ABNORMAL LOW (ref 78.0–100.0)
Monocytes Absolute: 0.3 10*3/uL (ref 0.1–1.0)
Monocytes Relative: 6 % (ref 3–12)
Neutro Abs: 2.1 10*3/uL (ref 1.7–7.7)
Neutrophils Relative %: 40 % — ABNORMAL LOW (ref 43–77)
Platelets: 303 10*3/uL (ref 150–400)
RBC: 4.85 MIL/uL (ref 3.87–5.11)
RDW: 15.7 % — ABNORMAL HIGH (ref 11.5–15.5)
WBC: 5.2 10*3/uL (ref 4.0–10.5)

## 2014-08-23 LAB — LIPASE, BLOOD: Lipase: 30 U/L (ref 11–59)

## 2014-08-23 LAB — COMPREHENSIVE METABOLIC PANEL
ALT: 20 U/L (ref 0–35)
AST: 22 U/L (ref 0–37)
Albumin: 3.8 g/dL (ref 3.5–5.2)
Alkaline Phosphatase: 89 U/L (ref 39–117)
Anion gap: 6 (ref 5–15)
BUN: 9 mg/dL (ref 6–23)
CO2: 23 mmol/L (ref 19–32)
Calcium: 9 mg/dL (ref 8.4–10.5)
Chloride: 106 mmol/L (ref 96–112)
Creatinine, Ser: 0.73 mg/dL (ref 0.50–1.10)
GFR calc Af Amer: >90 mL/min (ref >90)
GFR calc non Af Amer: >90 mL/min (ref >90)
Glucose, Bld: 105 mg/dL — ABNORMAL HIGH (ref 70–99)
Potassium: 4.2 mmol/L (ref 3.5–5.1)
Sodium: 135 mmol/L (ref 135–145)
Total Bilirubin: 0.6 mg/dL (ref 0.3–1.2)
Total Protein: 7.2 g/dL (ref 6.0–8.3)

## 2014-08-23 LAB — URINALYSIS, ROUTINE W REFLEX MICROSCOPIC
Bilirubin Urine: NEGATIVE
Glucose, UA: NEGATIVE mg/dL
Hgb urine dipstick: NEGATIVE
Ketones, ur: NEGATIVE mg/dL
Nitrite: NEGATIVE
Protein, ur: NEGATIVE mg/dL
Specific Gravity, Urine: 1.025 (ref 1.005–1.030)
Urobilinogen, UA: 1 mg/dL (ref 0.0–1.0)
pH: 6 (ref 5.0–8.0)

## 2014-08-23 LAB — URINE MICROSCOPIC-ADD ON

## 2014-08-23 LAB — D-DIMER, QUANTITATIVE: D-Dimer, Quant: 0.42 ug/mL-FEU (ref 0.00–0.48)

## 2014-08-23 LAB — I-STAT CG4 LACTIC ACID, ED: Lactic Acid, Venous: 1.24 mmol/L (ref 0.5–2.0)

## 2014-08-23 MED ORDER — IOHEXOL 300 MG/ML  SOLN
25.0000 mL | Freq: Once | INTRAMUSCULAR | Status: AC | PRN
  Administered 2014-08-23: 25 mL via ORAL

## 2014-08-23 MED ORDER — PREDNISONE 20 MG PO TABS
60.0000 mg | ORAL_TABLET | Freq: Once | ORAL | Status: AC
  Administered 2014-08-23: 60 mg via ORAL
  Filled 2014-08-23: qty 3

## 2014-08-23 MED ORDER — IPRATROPIUM-ALBUTEROL 0.5-2.5 (3) MG/3ML IN SOLN
3.0000 mL | Freq: Once | RESPIRATORY_TRACT | Status: AC
  Administered 2014-08-23: 3 mL via RESPIRATORY_TRACT
  Filled 2014-08-23: qty 3

## 2014-08-23 MED ORDER — SODIUM CHLORIDE 0.9 % IV BOLUS (SEPSIS)
1000.0000 mL | Freq: Once | INTRAVENOUS | Status: AC
  Administered 2014-08-23: 1000 mL via INTRAVENOUS

## 2014-08-23 NOTE — ED Notes (Signed)
C/o persistent cough, not improved w Rx

## 2014-08-23 NOTE — ED Provider Notes (Signed)
CSN: [id number]     Arrival date & time 08/23/14  1847 History   None    Chief Complaint  Patient presents with  . Cough   (Consider location/radiation/quality/duration/timing/severity/associated sxs/prior Treatment) Patient is a 49 y.o. female presenting with cough. The history is provided by the patient.  Cough Cough characteristics:  Non-productive and dry Severity:  Moderate Onset quality:  Gradual Duration:  4 days Progression:  Worsening Chronicity:  New Smoker: no   Context comment:  Seen 2/13 neg cxr , given z-pack and sx worse, more tired feeling, no fever, no pain .no h/o asthma or dyspnea Associated symptoms: no fever     Past Medical History  Diagnosis Date  . Obesity   . Ileitis     10/2013   Past Surgical History  Procedure Laterality Date  . Cholecystectomy    . Appendectomy    . Total abdominal hysterectomy     Family History  Problem Relation Age of Onset  . Diabetes Mother   . Heart disease Mother     chf  . Hypertension Mother    History  Substance Use Topics  . Smoking status: Never Smoker   . Smokeless tobacco: Not on file  . Alcohol Use: No   OB History    No data available     Review of Systems  Constitutional: Positive for fatigue. Negative for fever.  HENT: Negative.   Respiratory: Positive for cough.   Cardiovascular: Negative.  Negative for leg swelling.    Allergies  Review of patient's allergies indicates no known allergies.  Home Medications   Prior to Admission medications   Medication Sig Start Date End Date Taking? Authorizing Provider  azithromycin (ZITHROMAX Z-PAK) 250 MG tablet Take as directed on pack 08/19/14   Linna Hoff, MD  ibuprofen (ADVIL,MOTRIN) 600 MG tablet Take 1 tablet (600 mg total) by mouth every 6 (six) hours as needed. 07/13/14 07/13/15  Luisa Dago, MD  Multiple Vitamin (MULTIVITAMIN WITH MINERALS) TABS Take 1 tablet by mouth daily.    Historical Provider, MD  ondansetron (ZOFRAN) 4 MG tablet Take  1 tablet (4 mg total) by mouth every 8 (eight) hours as needed for nausea or vomiting. 10/13/13   Courtney Paris, MD  pantoprazole (PROTONIX) 40 MG tablet TAKE 1 TABLET (40 MG TOTAL) BY MOUTH DAILY. 08/07/14   Burns Spain, MD   BP 122/82 mmHg  Pulse 75  Temp(Src) 97.6 F (36.4 C) (Oral)  Resp 20  SpO2 98% Physical Exam  Constitutional: She is oriented to person, place, and time. She appears well-developed and well-nourished. She appears distressed.  HENT:  Mouth/Throat: Oropharynx is clear and moist.  Eyes: Conjunctivae are normal. Pupils are equal, round, and reactive to light.  Neck: Normal range of motion. Neck supple.  Cardiovascular: Normal rate, regular rhythm, normal heart sounds and intact distal pulses.   Pulmonary/Chest: Effort normal and breath sounds normal. No respiratory distress. She has no wheezes. She has no rales.  Lymphadenopathy:    She has no cervical adenopathy.  Neurological: She is alert and oriented to person, place, and time.  Skin: Skin is warm and dry.  Nursing note and vitals reviewed.   ED Course  Procedures (including critical care time) Labs Review Labs Reviewed - No data to display  Imaging Review No results found.   MDM   1. Dyspnea    Sent for further eval and care of 49yo female with unexplained chest illness for 4 wks, cxr neg when  seen 2/13, given z-pack for atypical etiol but sx not changed.    Linna HoffJames D Kindl, MD 08/23/14 2019

## 2014-08-23 NOTE — ED Notes (Signed)
Pt. reports persistent / chronic dry cough with chest congestion for 3 weeks , seen at urgent care 08/19/2014 chest x-ray done and prescribed with oral antibiotic ( Zithromax) with no improvement . Denies fever or chills.

## 2014-08-23 NOTE — ED Provider Notes (Signed)
CSN: [id number]     Arrival date & time 08/23/14  2030 History  This chart was scribed for Wynetta Emery, PA-C, working with Glynn Octave, MD by Chestine Spore, ED Scribe. The patient was seen in room TR09C/TR09C at 8:53 PM.    Chief Complaint  Patient presents with  . Cough  . Abdominal Pain     The history is provided by the patient. No language interpreter was used.    HPI Comments: Connie Mcneil is a 49 y.o. female who presents to the Emergency Department complaining of chronic dry cough onset 3 weeks. She initially thought she had a cold. She notes that when she coughs it sounds like the phlegm is breaking up but it is not productive. She reports that she was seen at urgent care on 2/13 where she had a negative CXR and then again today for her chronic cough. She was Rx 5 day azithromycin that she finished today and cough medication for her symptoms. She notes that she is not getting better. She states that she is having associated symptoms of chest congestion, abdominal pain, pain with taking a deep breathe, SOB, fever, n/v, darkened urine, sore throat. She notes that she is trying to make sure that she is eating and drinking, but it is not without pain. She reports that the abdominal pain began 3 days ago. She rates her abdominal pain as 8/10. She notes that she was admitted for 1 week once before for her abdominal pain. She states that she has tried Nyquil with no relief for her symptoms. She denies chills, diarrhea, hemoptysis, calf pain and any other symptoms. She denies any allergies to medications. Denies blood clots in legs or lungs. Denies long trips. Denies being DM. She reports that her PCP is at the clinic and at this time she can not remember.   Past Medical History  Diagnosis Date  . Obesity   . Ileitis     10/2013   Past Surgical History  Procedure Laterality Date  . Cholecystectomy    . Appendectomy    . Total abdominal hysterectomy     Family History  Problem  Relation Age of Onset  . Diabetes Mother   . Heart disease Mother     chf  . Hypertension Mother    History  Substance Use Topics  . Smoking status: Never Smoker   . Smokeless tobacco: Not on file  . Alcohol Use: No   OB History    No data available     Review of Systems  Respiratory: Positive for cough.   Gastrointestinal: Positive for abdominal pain.    A complete 10 system review of systems was obtained and all systems are negative except as noted in the HPI and PMH.    Allergies  Review of patient's allergies indicates no known allergies.  Home Medications   Prior to Admission medications   Medication Sig Start Date End Date Taking? Authorizing Provider  azithromycin (ZITHROMAX Z-PAK) 250 MG tablet Take as directed on pack 08/19/14   Linna Hoff, MD  ibuprofen (ADVIL,MOTRIN) 600 MG tablet Take 1 tablet (600 mg total) by mouth every 6 (six) hours as needed. 07/13/14 07/13/15  Luisa Dago, MD  Multiple Vitamin (MULTIVITAMIN WITH MINERALS) TABS Take 1 tablet by mouth daily.    Historical Provider, MD  ondansetron (ZOFRAN) 4 MG tablet Take 1 tablet (4 mg total) by mouth every 8 (eight) hours as needed for nausea or vomiting. 10/13/13   Courtney Paris, MD  pantoprazole (PROTONIX) 40 MG tablet TAKE 1 TABLET (40 MG TOTAL) BY MOUTH DAILY. 08/07/14   Burns Spain, MD   BP 104/64 mmHg  Pulse 79  Temp(Src) 98.3 F (36.8 C) (Oral)  Resp 16  SpO2 99%  Physical Exam  Constitutional: She is oriented to person, place, and time. She appears well-developed and well-nourished. No distress.  HENT:  Head: Normocephalic and atraumatic.  Mouth/Throat: Oropharynx is clear and moist.  Eyes: EOM are normal. Pupils are equal, round, and reactive to light.  Neck: Normal range of motion. Neck supple. No tracheal deviation present.  Cardiovascular: Normal rate, regular rhythm and intact distal pulses.   Pulmonary/Chest: Effort normal. No respiratory distress. She has wheezes. She has no  rales. She exhibits no tenderness.  Abdominal: Soft. She exhibits no distension and no mass. There is tenderness. There is no rebound and no guarding.  Left upper and left lower quadrant tenderness palpation, no guarding or rebound  Musculoskeletal: Normal range of motion. She exhibits no edema or tenderness.  No calf asymmetry, superficial collaterals, palpable cords, edema, Homans sign negative bilaterally.    Neurological: She is alert and oriented to person, place, and time.  Skin: Skin is warm and dry.  Psychiatric: She has a normal mood and affect. Her behavior is normal.  Nursing note and vitals reviewed.   ED Course  Procedures (including critical care time) DIAGNOSTIC STUDIES: Oxygen Saturation is 99% on RA, normal by my interpretation.    COORDINATION OF CARE: 9:00 PM-Discussed treatment plan which includes CXR, prednisone, Duoneb, UA, EKG, CBC, I-stat CG4, CMAT with pt at bedside and pt agreed to plan.   Labs Review Labs Reviewed  URINALYSIS, ROUTINE W REFLEX MICROSCOPIC - Abnormal; Notable for the following:    APPearance CLOUDY (*)    Leukocytes, UA TRACE (*)    All other components within normal limits  CBC WITH DIFFERENTIAL/PLATELET - Abnormal; Notable for the following:    Hemoglobin 11.9 (*)    MCV 75.9 (*)    MCH 24.5 (*)    RDW 15.7 (*)    Neutrophils Relative % 40 (*)    Lymphocytes Relative 47 (*)    Eosinophils Relative 6 (*)    All other components within normal limits  COMPREHENSIVE METABOLIC PANEL - Abnormal; Notable for the following:    Glucose, Bld 105 (*)    All other components within normal limits  URINE MICROSCOPIC-ADD ON - Abnormal; Notable for the following:    Squamous Epithelial / LPF MANY (*)    Bacteria, UA FEW (*)    Crystals CA OXALATE CRYSTALS (*)    All other components within normal limits  URINE CULTURE  LIPASE, BLOOD  D-DIMER, QUANTITATIVE  I-STAT CG4 LACTIC ACID, ED    Imaging Review Dg Chest 2 View  08/23/2014    CLINICAL DATA:  Cough and dyspnea, 1 month duration.  EXAM: CHEST  2 VIEW  COMPARISON:  08/19/2014  FINDINGS: Minimal linear left base opacity is unchanged and likely represents scarring. The lungs are otherwise clear. The pulmonary vasculature is normal. Hilar, mediastinal and cardiac contours appear unremarkable. There are no pleural effusions.  IMPRESSION: No active cardiopulmonary disease.   Electronically Signed   By: Ellery Plunk M.D.   On: 08/23/2014 21:29     EKG Interpretation   Date/Time:  Wednesday August 23 2014 21:40:52 EST Ventricular Rate:  78 PR Interval:  134 QRS Duration: 78 QT Interval:  398 QTC Calculation: 453 R Axis:   16 Text  Interpretation:  Normal sinus rhythm Low voltage QRS Cannot rule out  Anterior infarct , age undetermined Abnormal ECG No significant change was  found Confirmed by Manus GunningANCOUR  MD, STEPHEN [phone]([id number]) on 08/23/2014 9:50:19 PM      MDM   Final diagnoses:  Cough  Left lower quadrant pain  Bronchiectasis with acute exacerbation  Bilateral renal cysts   Filed Vitals:   08/23/14 2035 08/23/14 2037  BP: 115/38 104/64  Pulse: 79   Temp: 98.3 F (36.8 C)   TempSrc: Oral   Resp: 16   SpO2: 99%     Medications  0.9 %  sodium chloride infusion ( Intravenous New Bag/Given 08/24/14 0033)  predniSONE (DELTASONE) tablet 60 mg (60 mg Oral Given 08/23/14 2138)  ipratropium-albuterol (DUONEB) 0.5-2.5 (3) MG/3ML nebulizer solution 3 mL (3 mLs Nebulization Given 08/23/14 2138)  sodium chloride 0.9 % bolus 1,000 mL (0 mLs Intravenous Stopped 08/24/14 0032)  iohexol (OMNIPAQUE) 300 MG/ML solution 25 mL (25 mLs Oral Contrast Given 08/23/14 2344)  morphine 4 MG/ML injection 4 mg (4 mg Intravenous Given 08/24/14 0033)  ondansetron (ZOFRAN) injection 4 mg (4 mg Intravenous Given 08/24/14 0033)    Connie Mcneil is a pleasant 49 y.o. female presenting with persistent dry cough for 3 weeks, pleuritic chest pain shortness of breath, nausea vomiting and  abdominal pain. Abdominal exam is nonsurgical, chest x-rays without infiltrate, EKG is nonischemic, she has no signs or symptom of DVT and dimer is negative. Normal lactic acid and no significant abnormality on CBC or chemistry. Considering patient's history of ileitis, nausea vomiting and abdominal pain, abdominal CT is ordered.  Wheezing has improved after nebulizer and prednisone. Think her cough is likely just secondary to a persistent acute bronchitis. Plan to DC on steroids and albuterol inhaler with narcotic cough suppressant.   CT reveals renal cysts and bronchiectasis bilaterally. Case discussed with attending physician who agrees with care plan and stability to discharge to home. Patient will be given a pulmonology referral and she will follow with her primary care at her appointment next week. Serial abdominal exams remained nonsurgical. Patient is tolerating by mouth and amenable to discharge.  Evaluation does not show pathology that would require ongoing emergent intervention or inpatient treatment. Pt is hemodynamically stable and mentating appropriately. Discussed findings and plan with patient/guardian, who agrees with care plan. All questions answered. Return precautions discussed and outpatient follow up given.   New Prescriptions   ONDANSETRON (ZOFRAN) 4 MG TABLET    Take 1 tablet (4 mg total) by mouth every 6 (six) hours as needed for nausea or vomiting.   OXYCODONE-ACETAMINOPHEN (PERCOCET/ROXICET) 5-325 MG PER TABLET    1 to 2 tabs PO q6hrs  PRN for pain   PREDNISONE (DELTASONE) 50 MG TABLET    Take 1 tablet daily with breakfast     I personally performed the services described in this documentation, which was scribed in my presence. The recorded information has been reviewed and is accurate.    Wynetta Emeryicole Maicie Vanderloop, PA-C 08/24/14 0152  Wynetta EmeryNicole Nithila Sumners, PA-C 08/24/14 [id number]  Tomasita CrumbleAdeleke Oni, MD 08/24/14 [phone]

## 2014-08-24 ENCOUNTER — Emergency Department (HOSPITAL_COMMUNITY): Payer: BLUE CROSS/BLUE SHIELD

## 2014-08-24 ENCOUNTER — Encounter (HOSPITAL_COMMUNITY): Payer: Self-pay | Admitting: Radiology

## 2014-08-24 MED ORDER — ONDANSETRON HCL 4 MG/2ML IJ SOLN
4.0000 mg | Freq: Once | INTRAMUSCULAR | Status: AC
  Administered 2014-08-24: 4 mg via INTRAVENOUS
  Filled 2014-08-24: qty 2

## 2014-08-24 MED ORDER — SODIUM CHLORIDE 0.9 % IV SOLN
INTRAVENOUS | Status: DC
  Administered 2014-08-24: 01:00:00 via INTRAVENOUS

## 2014-08-24 MED ORDER — ONDANSETRON HCL 4 MG PO TABS: 4.0000 mg | ORAL_TABLET | Freq: Four times a day (QID) | ORAL | Status: DC | PRN

## 2014-08-24 MED ORDER — OXYCODONE-ACETAMINOPHEN 5-325 MG PO TABS: ORAL_TABLET | ORAL | Status: DC

## 2014-08-24 MED ORDER — IOHEXOL 300 MG/ML  SOLN
100.0000 mL | Freq: Once | INTRAMUSCULAR | Status: AC | PRN
  Administered 2014-08-24: 100 mL via INTRAVENOUS

## 2014-08-24 MED ORDER — ALBUTEROL SULFATE HFA 108 (90 BASE) MCG/ACT IN AERS
2.0000 | INHALATION_SPRAY | Freq: Once | RESPIRATORY_TRACT | Status: AC
  Administered 2014-08-24: 2 via RESPIRATORY_TRACT
  Filled 2014-08-24: qty 6.7

## 2014-08-24 MED ORDER — PREDNISONE 50 MG PO TABS: ORAL_TABLET | ORAL | Status: DC

## 2014-08-24 MED ORDER — MORPHINE SULFATE 4 MG/ML IJ SOLN
4.0000 mg | Freq: Once | INTRAMUSCULAR | Status: AC
  Administered 2014-08-24: 4 mg via INTRAVENOUS
  Filled 2014-08-24: qty 1

## 2014-08-24 NOTE — Discharge Instructions (Signed)
Please follow with your primary care doctor in the next 2 days for a check-up. They must obtain records for further management.   Do not hesitate to return to the Emergency Department for any new, worsening or concerning symptoms.   Take percocet for breakthrough pain, do not drink alcohol, drive, care for children or do other critical tasks while taking percocet.   Acute Bronchitis Bronchitis is when the airways that extend from the windpipe into the lungs get red, puffy, and painful (inflamed). Bronchitis often causes thick spit (mucus) to develop. This leads to a cough. A cough is the most common symptom of bronchitis. In acute bronchitis, the condition usually begins suddenly and goes away over time (usually in 2 weeks). Smoking, allergies, and asthma can make bronchitis worse. Repeated episodes of bronchitis may cause more lung problems. HOME CARE  Rest.  Drink enough fluids to keep your pee (urine) clear or pale yellow (unless you need to limit fluids as told by your doctor).  Only take over-the-counter or prescription medicines as told by your doctor.  Avoid smoking and secondhand smoke. These can make bronchitis worse. If you are a smoker, think about using nicotine gum or skin patches. Quitting smoking will help your lungs heal faster.  Reduce the chance of getting bronchitis again by:  Washing your hands often.  Avoiding people with cold symptoms.  Trying not to touch your hands to your mouth, nose, or eyes.  Follow up with your doctor as told. GET HELP IF: Your symptoms do not improve after 1 week of treatment. Symptoms include:  Cough.  Fever.  Coughing up thick spit.  Body aches.  Chest congestion.  Chills.  Shortness of breath.  Sore throat. GET HELP RIGHT AWAY IF:   You have an increased fever.  You have chills.  You have severe shortness of breath.  You have bloody thick spit (sputum).  You throw up (vomit) often.  You lose too much body  fluid (dehydration).  You have a severe headache.  You faint. MAKE SURE YOU:   Understand these instructions.  Will watch your condition.  Will get help right away if you are not doing well or get worse. Document Released: 12/10/2007 Document Revised: 02/23/2013 Document Reviewed: 12/14/2012 [facility] Patient Information 2015 [employer] [place]. This information is not intended to replace advice given to you by your health care provider. Make sure you discuss any questions you have with your health care provider.

## 2014-08-24 NOTE — ED Notes (Signed)
CT informed pt finished contrast 

## 2014-08-25 ENCOUNTER — Telehealth: Payer: Self-pay | Admitting: Adult Health

## 2014-08-25 LAB — URINE CULTURE: Colony Count: 40000

## 2014-08-25 NOTE — Telephone Encounter (Signed)
lmomtcb x 1 to set up consult appt with any provider that is next available.

## 2014-08-25 NOTE — Telephone Encounter (Signed)
Pt has been scheduled with MW on 09/04/14 at 3:15pm. Nothing further was needed.

## 2014-08-29 ENCOUNTER — Telehealth: Payer: Self-pay | Admitting: Internal Medicine

## 2014-08-29 NOTE — Addendum Note (Signed)
Addended by: Neomia DearPOWERS, Wafa Martes E on: 08/29/2014 01:52 PM   Modules accepted: Orders

## 2014-08-29 NOTE — Telephone Encounter (Signed)
Call to patient to confirm appointment for 08/30/14 at 1:15 lmtcb

## 2014-08-30 ENCOUNTER — Ambulatory Visit: Payer: BLUE CROSS/BLUE SHIELD | Admitting: Internal Medicine

## 2014-08-30 ENCOUNTER — Ambulatory Visit (HOSPITAL_COMMUNITY): Payer: BLUE CROSS/BLUE SHIELD

## 2014-08-31 ENCOUNTER — Telehealth: Payer: Self-pay | Admitting: Internal Medicine

## 2014-08-31 NOTE — Telephone Encounter (Signed)
Call to patient to confirm appointment for 09/04/14 at 10:15 lmtcb

## 2014-09-04 ENCOUNTER — Encounter: Payer: Self-pay | Admitting: Internal Medicine

## 2014-09-04 ENCOUNTER — Ambulatory Visit (INDEPENDENT_AMBULATORY_CARE_PROVIDER_SITE_OTHER): Payer: BLUE CROSS/BLUE SHIELD | Admitting: Internal Medicine

## 2014-09-04 ENCOUNTER — Ambulatory Visit (INDEPENDENT_AMBULATORY_CARE_PROVIDER_SITE_OTHER): Payer: BLUE CROSS/BLUE SHIELD | Admitting: Dietician

## 2014-09-04 ENCOUNTER — Other Ambulatory Visit: Payer: Self-pay | Admitting: Internal Medicine

## 2014-09-04 ENCOUNTER — Encounter: Payer: Self-pay | Admitting: Dietician

## 2014-09-04 ENCOUNTER — Ambulatory Visit (HOSPITAL_COMMUNITY)
Admission: RE | Admit: 2014-09-04 | Discharge: 2014-09-04 | Disposition: A | Discharge status: Home / Self Care | Payer: BLUE CROSS/BLUE SHIELD | Source: Ambulatory Visit | Attending: Internal Medicine | Admitting: Internal Medicine

## 2014-09-04 VITALS — BP 112/61 | HR 71 | Temp 97.8°F | Wt 284.0 lb

## 2014-09-04 VITALS — BP 124/78 | HR 82 | Temp 98.6°F | Ht 64.0 in | Wt 280.0 lb

## 2014-09-04 DIAGNOSIS — R059 Cough, unspecified: Secondary | ICD-10-CM

## 2014-09-04 DIAGNOSIS — K219 Gastro-esophageal reflux disease without esophagitis: Secondary | ICD-10-CM

## 2014-09-04 DIAGNOSIS — R05 Cough: Secondary | ICD-10-CM | POA: Diagnosis not present

## 2014-09-04 DIAGNOSIS — Z1231 Encounter for screening mammogram for malignant neoplasm of breast: Secondary | ICD-10-CM

## 2014-09-04 DIAGNOSIS — Z713 Dietary counseling and surveillance: Secondary | ICD-10-CM

## 2014-09-04 DIAGNOSIS — E669 Obesity, unspecified: Secondary | ICD-10-CM

## 2014-09-04 DIAGNOSIS — Z0000 Encounter for general adult medical examination without abnormal findings: Secondary | ICD-10-CM

## 2014-09-04 DIAGNOSIS — R6889 Other general symptoms and signs: Secondary | ICD-10-CM | POA: Diagnosis not present

## 2014-09-04 LAB — TSH: TSH: 1.851 u[IU]/mL (ref 0.350–4.500)

## 2014-09-04 MED ORDER — FAMOTIDINE 20 MG PO TABS: ORAL_TABLET | ORAL | Status: DC

## 2014-09-04 MED ORDER — TRAMADOL HCL 50 MG PO TABS: ORAL_TABLET | ORAL | Status: DC

## 2014-09-04 NOTE — Progress Notes (Signed)
  Medical Nutrition Therapy:  Appt start time: 1140 end time:  1215.  Assessment:  Primary concerns today: weight loss.   Patient presented for weight loss counseling. She lost weight ~ 10 years ago using slimfast twice a day long with exercise; she dropped several dress sizes. Today she says her full time employment, watching her grandchildren, knee pain and recent loss of her mother are possible barriers to her achieving weight loss. She has never recorded what she eats. She pays money to belong to a gym. Her daughter is also working on weight loss in preparation for bariatric surgery.  Preferred Learning Style: No preference indicated  Learning Readiness: Not ready/Contemplating  MEDICATIONS: recently on prednisone   DIETARY INTAKE: Usual eating pattern includes 1-2 meals and ?  snacks per day. Everyday foods include juice, smoothies, water, 2% milk.  Avoided foods include none discussed today.   24-hr recall:  Often does not eat all day and then eats when she gets home Beverages: water,   Usual physical activity: stopped exercising over past month with death of her mother  Estimated energy needs for ~ 1/2 #/week wt. loss: 1900-2100 calories 102-105 g protein  Progress Towards Goal(s):  Modified goal(s).   Nutritional Diagnosis:  NB-1.1 Food and nutrition-related knowledge deficit As related to lack of prior nutrition education and counseling.  As evidenced by her report of no previous training either by an Rd or otherwise. .    Intervention:  Nutrition education about weight loss program, skills and estimated calorie needs as well as on line tracking programs Teaching Method Utilized: Visual, Auditory Handouts given during visit include:Food Records and contact information Barriers to learning/adherence to lifestyle change:  GOAL: Keep a 3 day food record and follow up.   Demonstrated degree of understanding via:  Teach Back   Monitoring/Evaluation:  Dietary intake, exercise,  and body weight prn. Recommended follow up in 1 week either by phone or visit. Patient to check on weight loss/nutrition programs closer to her home and covered by her insurer as well as what her nutrition benefits are

## 2014-09-04 NOTE — Assessment & Plan Note (Signed)

## 2014-09-04 NOTE — Patient Instructions (Addendum)
Pantoprazole (protonix) 40 mg   Take 30-60 min before first meal of the day and Pepcid 20 mg one bedtime until cough is gone for a week with no need for cough medications  Take delsym two tsp every 12 hours and supplement if needed with  tramadol 50 mg up to 2 every 4 hours to suppress the urge to cough. Swallowing water or using ice chips/non mint and menthol containing candies (such as lifesavers or sugarless jolly ranchers) are also effective.  You should rest your voice and avoid activities that you know make you cough.  Once you have eliminated the cough for 3 straight days try reducing the tramadol first,  then the delsym as tolerated.    GERD (REFLUX)  is an extremely common cause of respiratory symptoms just like yours , many times with no obvious heartburn at all.    It can be treated with medication, but also with lifestyle changes including avoidance of late meals, excessive alcohol, smoking cessation, and avoid fatty foods, chocolate, peppermint, colas, red wine, and acidic juices such as orange juice.  NO MINT OR MENTHOL PRODUCTS SO NO COUGH DROPS  USE SUGARLESS CANDY INSTEAD (Jolley ranchers or Stover's or Life Savers) or even ice chips will also do - the key is to swallow to prevent all throat clearing. NO OIL BASED VITAMINS - use powdered substitutes.   Return in 2 weeks if not 100% better

## 2014-09-04 NOTE — Progress Notes (Signed)
Subjective:     Patient ID: Connie Mcneil, female   DOB: 29-Jun-1966      MRN: [id number]  HPI  1 yobf never smoker with some spring time allergies with sneezing but never cough or wheeze then onset of uri type symptoms late jan 2016 rx by UC x 2 and ER x 1 and no better so self referred to pulmonary clinic 09/04/2014 .    09/04/2014 1st [facility] office visit/ Kinnley Paulson   Chief Complaint  Patient presents with  . Advice Only    cough x 1 month; feels SOB after coughing spell; previously chest tightness till Prednisone from ED at [facility]   acute onset Mid Jan 2016 severe cough urinary incont and coughs so hard loses breath, really not able to cough anything up  Prednisone finished p 5 days, helped the most  Assoc with hoarseness, immediately if try to lie down esp on L side     Kouffman Reflux v Neurogenic Cough Differentiator Reflux Comments  Do you awaken from a sound sleep coughing violently?                            With trouble breathing? Yes   Do you have choking episodes when you cannot  Get enough air, gasping for air ?              Yes   Do you usually cough when you lie down into  The bed, or when you just lie down to rest ?                          Yes   Do you usually cough after meals or eating?         No   Do you cough when (or after) you bend over?    No   GERD SCORE     Kouffman Reflux v Neurogenic Cough Differentiator Neurogenic   Do you more-or-less cough all day long? Mostly pms   Does change of temperature make you cough? no   Does laughing or chuckling cause you to cough? no   Do fumes (perfume, automobile fumes, burned  Toast, etc.,) cause you to cough ?      no   Does speaking, singing, or talking on the phone cause you to cough   ?               No    Neurogenic/Airway score      No obvious patterns in  day to day or daytime variabilty or assoc sob (unless coughing) cp  Or subjective wheeze overt sinus or hb symptoms. No unusual exp hx or h/o childhood  pna/ asthma or knowledge of premature birth.  Sleeping ok without nocturnal  or early am exacerbation  of respiratory  c/o's or need for noct saba. Also denies any obvious fluctuation of symptoms with weather or environmental changes or other aggravating or alleviating factors except as outlined above   Current Medications, Allergies, Complete Past Medical History, Past Surgical History, Family History, and Social History were reviewed in [employer] record.  ROS  The following are not active complaints unless bolded sore throat, dysphagia, dental problems, itching, sneezing,  nasal congestion or excess/ purulent secretions, ear ache,   fever, chills, sweats, unintended wt loss, pleuritic or exertional cp, hemoptysis,  orthopnea pnd or leg swelling, presyncope, palpitations, heartburn, abdominal pain, anorexia, nausea, vomiting,  diarrhea  or change in bowel or urinary habits, change in stools or urine, dysuria,hematuria,  rash, arthralgias, visual complaints, headache, numbness weakness or ataxia or problems with walking or coordination,  change in mood/affect or memory.        Review of Systems     Objective:   Physical Exam       Wt Readings from Last 3 Encounters:  09/04/14 280 lb (127.007 kg)  09/04/14 284 lb (128.822 kg)  07/13/14 282 lb (127.914 kg)    Vital signs reviewed   HEENT: nl dentition, turbinates, and orophanx. Nl external ear canals without cough reflex   NECK :  without JVD/Nodes/TM/ nl carotid upstrokes bilaterally   LUNGS: no acc muscle use, clear to A and P bilaterally without cough on insp or exp maneuvers   CV:  RRR  no s3 or murmur or increase in P2, no edema   ABD:  soft and nontender with nl excursion in the supine position. No bruits or organomegaly, bowel sounds nl  MS:  warm without deformities, calf tenderness, cyanosis or clubbing  SKIN: warm and dry without lesions    NEURO:  alert, approp, no deficits    I  personally reviewed images and agree with radiology impression as follows:  CXR: 08/23/14  No active cardiopulmonary disease      Assessment:

## 2014-09-04 NOTE — Patient Instructions (Addendum)
General Instructions: -Start taking Protonix daily to see if it improves your cough.  -Follow up with your lung doctor this afternoon.  -Follow up with us in 3 months or sooner.   Please bring your medicines with you each time you come to clinic.  Medicines may include prescription medications, over-the-counter medications, herbal remedies, eye drops, vitamins, or other pills.

## 2014-09-05 NOTE — Progress Notes (Signed)
   Subjective:    Patient ID: Connie Mcneil, female    DOB: 12/05/1965, 49 y.o.   MRN: [id number]  HPI Connie Mcneil is a 49 yr old woman with PMH of obesity, bilateral renal cysts, presenting for evaluation of cough that has been present off and on for months. She was seen on 08/23/14 in the Urgent Care for this non productive cough and for abdominal pain with negative CT abdomen but bronchiectasis. She was given prednisone x5 days and Percocet for her abd pain. Her abdominal pain resolved a few days after her visit.  She had been seen before on 2/13 at the ED for the same dry cough and had been prescribed Zpack which had not improved her symptoms.  She reports that her cough is worse at night. She does not have post-nasal drip, fever/chills, or SOB.  [facility] and was prescribed a PPI for likely GERD-like symptoms that could trigger her cough but she has not started this medication.    Review of Systems  Constitutional: Positive for fatigue. Negative for fever, chills, diaphoresis, activity change, appetite change and unexpected weight change.  HENT: Negative for congestion, postnasal drip and rhinorrhea.   Respiratory: Positive for cough. Negative for shortness of breath and wheezing.   Cardiovascular: Negative for chest pain, palpitations and leg swelling.  Gastrointestinal: Negative for abdominal pain, diarrhea and constipation.  Genitourinary: Negative for dysuria and difficulty urinating.  Musculoskeletal: Negative for back pain.  Neurological: Negative for dizziness and light-headedness.  Psychiatric/Behavioral: Negative for agitation.       Objective:   Physical Exam  Constitutional: She is oriented to person, place, and time. She appears well-developed and well-nourished. No distress.  Tired appearing, obese  HENT:  Head: Normocephalic.  Mouth/Throat: Oropharynx is clear and moist. No oropharyngeal exudate.  Pale nasal turbinates bilaterally    Eyes: Conjunctivae are normal. No scleral icterus.  Neck: No thyromegaly present.  Cardiovascular: Normal rate.   Pulmonary/Chest: Effort normal. No respiratory distress. She has no wheezes. She has no rales.  Abdominal: Soft. Bowel sounds are normal. There is no tenderness.  Musculoskeletal: She exhibits no edema or tenderness.  Lymphadenopathy:    She has no cervical adenopathy.  Neurological: She is alert and oriented to person, place, and time. Coordination normal.  Skin: Skin is warm and dry. No rash noted. She is not diaphoretic.  Psychiatric: She has a normal mood and affect.  Nursing note reviewed.         Assessment & Plan:

## 2014-09-05 NOTE — Assessment & Plan Note (Signed)
Start PPI 

## 2014-09-05 NOTE — Assessment & Plan Note (Addendum)
Could be 2/2 to GERD. She is not a smoker.  -Follow up with Pulmonology as she had already been referred from the ED -Start PPI.

## 2014-09-05 NOTE — Assessment & Plan Note (Signed)
This could be due to hypothyroidism along with fatigue.  Checked TSH which returned as wnl

## 2014-09-11 NOTE — Progress Notes (Signed)
INTERNAL MEDICINE TEACHING ATTENDING ADDENDUM - Egypt Marchiano, MD: I reviewed and discussed at the time of visit with the resident Dr. Kennerly, the patient's medical history, physical examination, diagnosis and results of pertinent tests and treatment and I agree with the patient's care as documented.  

## 2014-11-06 ENCOUNTER — Ambulatory Visit: Payer: BLUE CROSS/BLUE SHIELD | Admitting: Pulmonary Disease

## 2014-11-09 ENCOUNTER — Telehealth: Payer: Self-pay | Admitting: Pulmonary Disease

## 2014-11-09 NOTE — Telephone Encounter (Signed)
Call to patient to confirm appointment for 11/13/14 at 1:15 lmtcb

## 2014-11-13 ENCOUNTER — Ambulatory Visit: Payer: BLUE CROSS/BLUE SHIELD | Admitting: Pulmonary Disease

## 2014-12-19 ENCOUNTER — Telehealth: Payer: Self-pay | Admitting: Pulmonary Disease

## 2014-12-19 NOTE — Telephone Encounter (Signed)
Call to patient to confirm appointment for 6/15 at 10:15 lmtcb

## 2014-12-20 ENCOUNTER — Encounter: Payer: Self-pay | Admitting: Pulmonary Disease

## 2014-12-20 ENCOUNTER — Ambulatory Visit (INDEPENDENT_AMBULATORY_CARE_PROVIDER_SITE_OTHER): Payer: 59 | Admitting: Pulmonary Disease

## 2014-12-20 VITALS — BP 129/69 | HR 72 | Temp 98.4°F | Ht 64.0 in | Wt 289.6 lb

## 2014-12-20 DIAGNOSIS — M25562 Pain in left knee: Secondary | ICD-10-CM

## 2014-12-20 DIAGNOSIS — R600 Localized edema: Secondary | ICD-10-CM | POA: Diagnosis not present

## 2014-12-20 DIAGNOSIS — Z0000 Encounter for general adult medical examination without abnormal findings: Secondary | ICD-10-CM

## 2014-12-20 DIAGNOSIS — R609 Edema, unspecified: Secondary | ICD-10-CM

## 2014-12-20 DIAGNOSIS — Z1211 Encounter for screening for malignant neoplasm of colon: Secondary | ICD-10-CM

## 2014-12-20 DIAGNOSIS — Z6841 Body Mass Index (BMI) 40.0 and over, adult: Secondary | ICD-10-CM | POA: Diagnosis not present

## 2014-12-20 DIAGNOSIS — R05 Cough: Secondary | ICD-10-CM

## 2014-12-20 DIAGNOSIS — E66813 Obesity, class 3: Secondary | ICD-10-CM

## 2014-12-20 DIAGNOSIS — R059 Cough, unspecified: Secondary | ICD-10-CM

## 2014-12-20 MED ORDER — DICLOFENAC SODIUM 1 % TD GEL
4.0000 g | Freq: Four times a day (QID) | TRANSDERMAL | Status: DC | PRN
Start: 2014-12-20 — End: 2015-04-26

## 2014-12-20 NOTE — Progress Notes (Signed)
Internal Medicine Clinic Attending  Case discussed with Dr. Krall at the time of the visit.  We reviewed the resident's history and exam and pertinent patient test results.  I agree with the assessment, diagnosis, and plan of care documented in the resident's note.  

## 2014-12-20 NOTE — Patient Instructions (Signed)
Please follow up with Norm Parcel (dietician) either by phone or schedule an appointment.  Feet swelling (edema) Reduce salt (sodium) in your diet - Sodium, which is found in table salt and processed foods, can worsen edema. Reducing the amount of salt you consume can help to reduce edema.  Compression stockings - Leg edema can be prevented and treated with the use of compression stockings. Stockings are available in several heights, including knee-high, thigh-high, and pantyhose. Knee-high stockings are sufficient for most patients. Some stockings can cause skin irritation or pain, although proper measurement and fitting of the stockings can reduce the risk of discomfort.  Effective compression stockings apply the greatest amount of pressure at the ankle and gradually decrease the pressure up the leg. These stockings are available with varying degrees of compression.  Stockings with small amounts of compression can be purchased at pharmacies and surgical supply stores without a prescription.  Body positioning - Leg, ankle, and foot edema can be improved by elevating the legs above heart level for 30 minutes three or four times per day. Elevating the legs may be sufficient to reduce or eliminate edema for people with mild venous disease, but more severe cases require other measures. In addition, it may not be practical for those who work to elevate their legs several times per day.

## 2014-12-20 NOTE — Progress Notes (Signed)
   Subjective:   Patient ID: Connie Mcneil, female    DOB: [birth year], 49 y.o.   MRN: [id number]  HPI [name] is a 49 year old woman with history of GERD, obesity presenting for follow-up.  She was last seen in clinic 09/04/2014.  She reports her cough resolved. She is no longer taking Protonix or Pepcid.  She reports bilateral feet swelling for the past several weeks. She notes this started since she started training at work and has been more sedentary. Denies any chest pain or shortness of breath. She eats minimal processed food and does not add salt to her food.   Review of Systems Constitutional: no fevers/chills Eyes: no vision changes Ears, nose, mouth, throat, and face: no cough Respiratory: no shortness of breath Cardiovascular: no chest pain Gastrointestinal: no nausea/vomiting, no abdominal pain, no constipation, no diarrhea Genitourinary: no dysuria, no hematuria Integument: no rash Hematologic/lymphatic: no bleeding/bruising, no edema Musculoskeletal: no arthralgias, no myalgias Neurological: no paresthesias, no weakness  Past Medical History  Diagnosis Date  . Obesity   . Ileitis     10/2013    Current Outpatient Prescriptions on File Prior to Visit  Medication Sig Dispense Refill  . famotidine (PEPCID) 20 MG tablet One at bedtime 30 tablet 2  . ibuprofen (ADVIL,MOTRIN) 600 MG tablet Take 1 tablet (600 mg total) by mouth every 6 (six) hours as needed. 30 tablet 2  . Multiple Vitamin (MULTIVITAMIN WITH MINERALS) TABS Take 1 tablet by mouth daily.    . pantoprazole (PROTONIX) 40 MG tablet TAKE 1 TABLET (40 MG TOTAL) BY MOUTH DAILY. 30 tablet 0  . traMADol (ULTRAM) 50 MG tablet 1-2 every 4 hours as needed for cough or pain 40 tablet 0   No current facility-administered medications on file prior to visit.    Today's Vitals   12/20/14 1035  BP: 129/69  Pulse: 72  Temp: 98.4 F (36.9 C)  TempSrc: Oral  Height: 5\' 4"  (1.626 m)  Weight: 289 lb 9.6 oz  (131.362 kg)  SpO2: 100%  PainSc: 0-No pain   Objective:  Physical Exam  Constitutional: She is oriented to person, place, and time. She appears well-nourished. No distress.  HENT:  Head: Normocephalic and atraumatic.  Eyes: Conjunctivae are normal.  Neck: Neck supple.  Cardiovascular: Normal rate and regular rhythm.   Pulmonary/Chest: Effort normal and breath sounds normal.  Abdominal: Soft.  Musculoskeletal: Normal range of motion. She exhibits edema (trace nonpitting pedal ). She exhibits no tenderness.  Neurological: She is alert and oriented to person, place, and time.  Skin: Skin is warm and dry.  Psychiatric: She has a normal mood and affect.    Assessment & Plan:  Please refer to problem based charting.

## 2014-12-21 DIAGNOSIS — R609 Edema, unspecified: Secondary | ICD-10-CM | POA: Insufficient documentation

## 2014-12-21 NOTE — Assessment & Plan Note (Signed)
Resolved per patient. She is no longer taking Protonix or Pepcid.

## 2014-12-21 NOTE — Assessment & Plan Note (Signed)
Screening colonoscopy referral placed.

## 2014-12-21 NOTE — Assessment & Plan Note (Addendum)
She reports her left knee pain persists. She usually tries to walk 3 miles before work. She is looking into water aerobics.  Plan: -Recommend weight loss -Voltaren gel 4 times a day as needed for pain

## 2014-12-21 NOTE — Assessment & Plan Note (Signed)
She met with dietitian 09/04/2014. Has not followed up with her.  Plan: -Asked patient to follow up with dietitian -Follow up in 6 months

## 2014-12-21 NOTE — Assessment & Plan Note (Signed)
Bilateral feet and ankle swelling likely secondary to dependent edema. No signs of CHF. Minimal NSAID use. Her only home medication is a multivitamin. No evidence of CKD on BMP 11/25/2014. No evidence of cirrhosis on abdominal CT 08/24/2014. TSH within normal limits at 1.851 09/04/2014.  Plan: -Recommend decreasing sedentary time -Compression stockings -Elevate lower extremities -Minimize salt intake

## 2015-03-29 ENCOUNTER — Encounter: Payer: 59 | Admitting: Pulmonary Disease

## 2015-03-29 ENCOUNTER — Encounter: Payer: Self-pay | Admitting: Pulmonary Disease

## 2015-04-26 ENCOUNTER — Encounter: Payer: Self-pay | Admitting: Pulmonary Disease

## 2015-04-26 ENCOUNTER — Ambulatory Visit (INDEPENDENT_AMBULATORY_CARE_PROVIDER_SITE_OTHER): Payer: 59 | Admitting: Pulmonary Disease

## 2015-04-26 VITALS — BP 102/57 | HR 94 | Temp 97.8°F | Ht 64.0 in | Wt 262.6 lb

## 2015-04-26 DIAGNOSIS — K219 Gastro-esophageal reflux disease without esophagitis: Secondary | ICD-10-CM | POA: Diagnosis not present

## 2015-04-26 DIAGNOSIS — Z6841 Body Mass Index (BMI) 40.0 and over, adult: Secondary | ICD-10-CM

## 2015-04-26 DIAGNOSIS — Z23 Encounter for immunization: Secondary | ICD-10-CM

## 2015-04-26 DIAGNOSIS — M25569 Pain in unspecified knee: Secondary | ICD-10-CM | POA: Diagnosis not present

## 2015-04-26 DIAGNOSIS — M25562 Pain in left knee: Secondary | ICD-10-CM

## 2015-04-26 DIAGNOSIS — M25561 Pain in right knee: Secondary | ICD-10-CM

## 2015-04-26 DIAGNOSIS — R609 Edema, unspecified: Secondary | ICD-10-CM

## 2015-04-26 MED ORDER — DICLOFENAC SODIUM 1 % TD GEL
4.0000 g | Freq: Four times a day (QID) | TRANSDERMAL | Status: DC | PRN
Start: 2015-04-26 — End: 2018-05-25

## 2015-04-26 NOTE — Assessment & Plan Note (Signed)
Is resolved. Avoids salt, which exacerbates.

## 2015-04-26 NOTE — Assessment & Plan Note (Signed)
Denies problems with reflux or heartburn. Cough resolved.  Plan: -Avoiding mint and chocolate to control symptoms.

## 2015-04-26 NOTE — Assessment & Plan Note (Addendum)
Filed Weights   04/26/15 1452  Weight: 262 lb 9.6 oz (119.115 kg)   Weight 09/04/2014: 280lb  She has lost 18 lbs over 8 months. Following in a weight loss clinic.

## 2015-04-26 NOTE — Assessment & Plan Note (Signed)
Occasional knee pain still. Had not been able to fill Voltaren gel. Would like to try it. She has been losing weight and exercising.  Plan: -Voltaren gel QID prn pain. -Continue weight loss

## 2015-04-26 NOTE — Progress Notes (Signed)
   Subjective:   Patient ID: Connie Mcneil, female    DOB: 05/25/1966, 49 y.o.   MRN: [id number]  HPI [name] is a 49 year old woman with history of obesity presenting for follow up.  She was last seen in clinic 09/04/2014.   [facility] [place] [facility]. Has been taking phentermine for last 3 months for weight loss. No problems with the medication. She is followed monthly.  She has no complaints otherwise. She occasionally still has knee pain and would like a refill on Voltaren gel. She has been walking and going to gym for weight training.  Review of Systems Constitutional: no fevers/chills Eyes: no vision changes Ears, nose, mouth, throat, and face: no cough Respiratory: no shortness of breath Cardiovascular: no chest pain Gastrointestinal: no nausea/vomiting, no abdominal pain, no constipation, no diarrhea Genitourinary: no dysuria, no hematuria Integument: no rash Hematologic/lymphatic: no bleeding/bruising, no edema Musculoskeletal: +chronic arthralgias, no myalgias Neurological: no paresthesias, no weakness  Past Medical History  Diagnosis Date  . Obesity   . Ileitis     10/2013    Current Outpatient Prescriptions on File Prior to Visit  Medication Sig Dispense Refill  . diclofenac sodium (VOLTAREN) 1 % GEL Apply 4 g topically 4 (four) times daily as needed. 100 g 0  . Multiple Vitamin (MULTIVITAMIN WITH MINERALS) TABS Take 1 tablet by mouth daily.     No current facility-administered medications on file prior to visit.    Today's Vitals   04/26/15 1452  BP: 102/57  Pulse: 94  Temp: 97.8 F (36.6 C)  TempSrc: Oral  Height: 5\' 4"  (1.626 m)  Weight: 262 lb 9.6 oz (119.115 kg)  SpO2: 99%  PainSc: 0-No pain   Objective:  Physical Exam  Constitutional: She is oriented to person, place, and time. She appears well-nourished. No distress.  HENT:  Head: Normocephalic and atraumatic.  Eyes: Conjunctivae are normal.    Neck: Neck supple.  Cardiovascular: Normal rate and regular rhythm.   Pulmonary/Chest: Effort normal and breath sounds normal.  Abdominal: Soft.  Musculoskeletal: Normal range of motion. She exhibits no edema or tenderness.  Neurological: She is alert and oriented to person, place, and time.  Skin: Skin is warm and dry.  Psychiatric: She has a normal mood and affect.   Assessment & Plan:  Please refer to problem based charting.

## 2015-04-26 NOTE — Patient Instructions (Addendum)
Health Maintenance, Female Adopting a healthy lifestyle and getting preventive care can go a long way to promote health and wellness.   In between checkups, there are plenty of things you can do on your own. Experts have done a lot of research about which lifestyle changes and preventive measures are most likely to keep you healthy. WEIGHT AND DIET  Eat a healthy diet  Be sure to include plenty of vegetables, fruits, low-fat dairy products, and lean protein.  Do not eat a lot of foods high in solid fats, added sugars, or salt.  Get regular exercise. This is one of the most important things you can do for your health.  Most adults should exercise for at least 150 minutes each week. The exercise should increase your heart rate and make you sweat (moderate-intensity exercise).  Most adults should also do strengthening exercises at least twice a week. This is in addition to the moderate-intensity exercise.   Maintain a healthy weight  Body mass index (BMI) is a measurement that can be used to identify possible weight problems. It estimates body fat based on height and weight. Your health care provider can help determine your BMI and help you achieve or maintain a healthy weight.  For females 38 years of age and older:   A BMI below 18.5 is considered underweight.  A BMI of 18.5 to 24.9 is normal.  A BMI of 25 to 29.9 is considered overweight.  A BMI of 30 and above is considered obese.   Watch levels of cholesterol and blood lipids  You should start having your blood tested for lipids and cholesterol at 51 years of age, then have this test every 5 years.  You may need to have your cholesterol levels checked more often if:  Your lipid or cholesterol levels are high.  You are older than 51 years of age.  You are at high risk for heart disease.  CANCER SCREENING    Colorectal Cancer  This type of cancer can be detected and often prevented.  Routine colorectal cancer  screening usually begins at 51 years of age and continues through 51 years of age.  Your health care provider may recommend screening at an earlier age if you have risk factors for colon cancer.  Your health care provider may also recommend using home test kits to check for hidden blood in the stool.  A small camera at the end of a tube can be used to examine your colon directly (sigmoidoscopy or colonoscopy). This is done to check for the earliest forms of colorectal cancer.  Routine screening usually begins at age 67.  Direct examination of the colon should be repeated every 5-10 years through 51 years of age. However, you may need to be screened more often if early forms of precancerous polyps or small growths are found. Skin Cancer  Check your skin from head to toe regularly.  Tell your health care provider about any new moles or changes in moles, especially if there is a change in a mole's shape or color.  Also tell your health care provider if you have a mole that is larger than the size of a pencil eraser.  Always use sunscreen. Apply sunscreen liberally and repeatedly throughout the day.  Protect yourself by wearing long sleeves, pants, a wide-brimmed hat, and sunglasses whenever you are outside. HEART DISEASE, DIABETES, AND HIGH BLOOD PRESSURE   High blood pressure causes heart disease and increases the risk of stroke. High blood pressure is  more likely to develop in:  People who have blood pressure in the high end of the normal range (130-139/85-89 mm Hg).  People who are overweight or obese.  People who are African American.  If you are 89-58 years of age, have your blood pressure checked every 3-5 years. If you are 43 years of age or older, have your blood pressure checked every year. You should have your blood pressure measured twice--once when you are at a hospital or clinic, and once when you are not at a hospital or clinic. Record the average of the two measurements.  To check your blood pressure when you are not at a hospital or clinic, you can use:  An automated blood pressure machine at a pharmacy.  A home blood pressure monitor.  If you are between 76 years and 87 years old, ask your health care provider if you should take aspirin to prevent strokes.  Have regular diabetes screenings. This involves taking a blood sample to check your fasting blood sugar level.  If you are at a normal weight and have a low risk for diabetes, have this test once every three years after 51 years of age.  If you are overweight and have a high risk for diabetes, consider being tested at a younger age or more often. PREVENTING INFECTION  Hepatitis B  If you have a higher risk for hepatitis B, you should be screened for this virus. You are considered at high risk for hepatitis B if:  You were born in a country where hepatitis B is common. Ask your health care provider which countries are considered high risk.  Your parents were born in a high-risk country, and you have not been immunized against hepatitis B (hepatitis B vaccine).  You have HIV or AIDS.  You use needles to inject street drugs.  You live with someone who has hepatitis B.  You have had sex with someone who has hepatitis B.  You get hemodialysis treatment.  You take certain medicines for conditions, including cancer, organ transplantation, and autoimmune conditions. Hepatitis C  Blood testing is recommended for:  Everyone born from [birth year] through 1967.  Anyone with known risk factors for hepatitis C. Sexually transmitted infections (STIs)  You should be screened for sexually transmitted infections (STIs) including gonorrhea and chlamydia if:  You are sexually active and are younger than 52 years of age.  You are older than 51 years of age and your health care provider tells you that you are at risk for this type of infection.  Your sexual activity has changed since you were last screened and  you are at an increased risk for chlamydia or gonorrhea. Ask your health care provider if you are at risk.  If you do not have HIV, but are at risk, it may be recommended that you take a prescription medicine daily to prevent HIV infection. This is called pre-exposure prophylaxis (PrEP). You are considered at risk if:  You are sexually active and do not regularly use condoms or know the HIV status of your partner(s).  You take drugs by injection.  You are sexually active with a partner who has HIV. Talk with your health care provider about whether you are at high risk of being infected with HIV. If you choose to begin PrEP, you should first be tested for HIV. You should then be tested every 3 months for as long as you are taking PrEP.   OSTEOPOROSIS AND MENOPAUSE   Osteoporosis is a  disease in which the bones lose minerals and strength with aging. This can result in serious bone fractures. Your risk for osteoporosis can be identified using a bone density scan.  If you are 51 years of age or older, or if you are at risk for osteoporosis and fractures, ask your health care provider if you should be screened.  Ask your health care provider whether you should take a calcium or vitamin D supplement to lower your risk for osteoporosis.  Menopause may have certain physical symptoms and risks.  Hormone replacement therapy may reduce some of these symptoms and risks. Talk to your health care provider about whether hormone replacement therapy is right for you.  HOME CARE INSTRUCTIONS   Schedule regular health, dental, and eye exams.  Stay current with your immunizations.   Do not use any tobacco products including cigarettes, chewing tobacco, or electronic cigarettes.  If you are pregnant, do not drink alcohol.  If you are breastfeeding, limit how much and how often you drink alcohol.  Limit alcohol intake to no more than 1 drink per day for nonpregnant women. One drink equals 12 ounces of  beer, 5 ounces of wine, or 1 ounces of hard liquor.  Do not use street drugs.  Do not share needles.  Ask your health care provider for help if you need support or information about quitting drugs.  Tell your health care provider if you often feel depressed.  Tell your health care provider if you have ever been abused or do not feel safe at home.   This information is not intended to replace advice given to you by your health care provider. Make sure you discuss any questions you have with your health care provider.   Document Released: 01/06/2011 Document Revised: 07/14/2014 Document Reviewed: 05/25/2013 Elsevier Interactive Patient Education [employer]

## 2015-05-07 NOTE — Progress Notes (Signed)
Internal Medicine Clinic Attending  Case discussed with Dr. Krall soon after the resident saw the patient.  We reviewed the resident's history and exam and pertinent patient test results.  I agree with the assessment, diagnosis, and plan of care documented in the resident's note. 

## 2016-05-11 IMAGING — CT CT ABD-PELV W/ CM
2 of 5 series · 11 of 46 positions shown, 12 images · IV contrast (omnipaque)
Comparison: 10/09/2013

CLINICAL DATA: Cough for 3 weeks. Non traumatic upper abdominal
pain.

EXAM:
CT ABDOMEN AND PELVIS WITH CONTRAST
TECHNIQUE: Multidetector CT imaging of the abdomen and pelvis was performed
using the standard protocol following bolus administration of
intravenous contrast.
CONTRAST:  100mL OMNIPAQUE IOHEXOL 300 MG/ML SOLN, 25mL OMNIPAQUE
IOHEXOL 300 MG/ML SOLN

[Series 201: routine, idose (2) · axial · 0.78mm/px · z∈[+84,+439]mm · 8 of 93 slices shown, 9 images]
[im 11/93  soft-tissue]
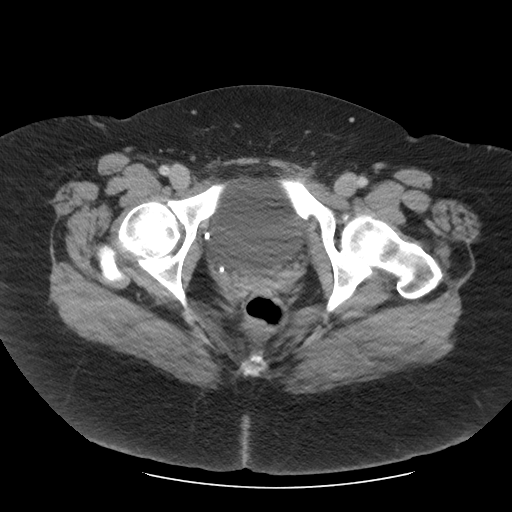
[im 11/93  bone]
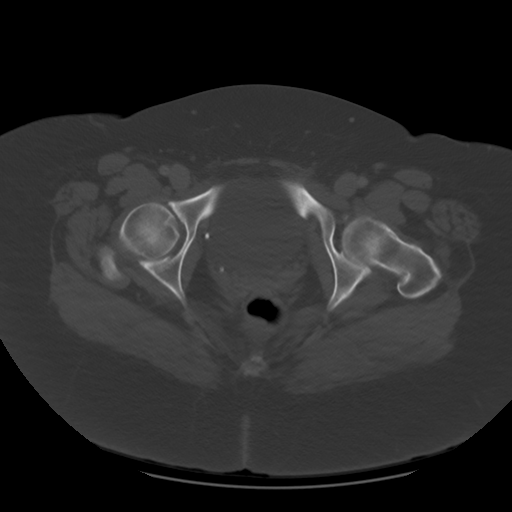
[im 21/93  soft-tissue]
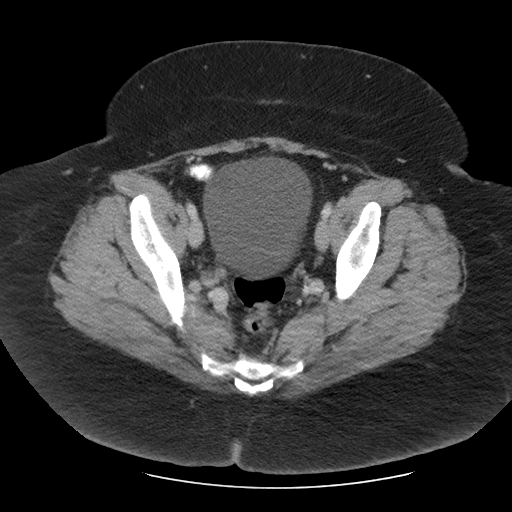
[im 31/93  soft-tissue]
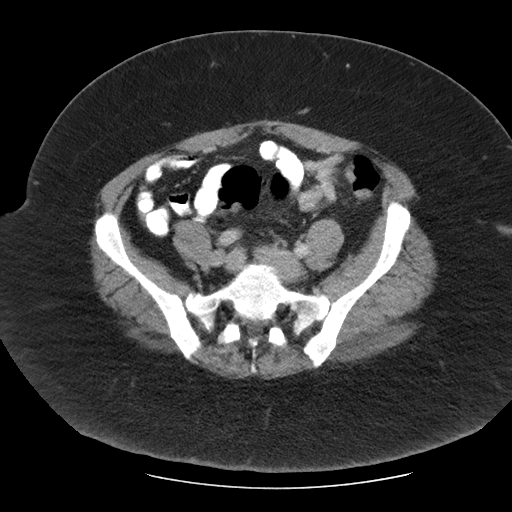
[im 41/93  soft-tissue]
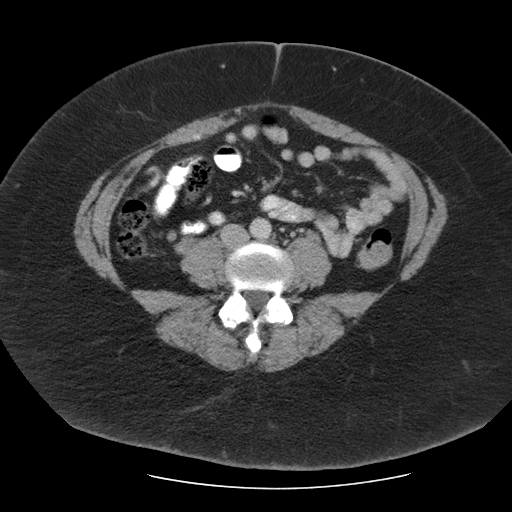
[im 52/93  soft-tissue]
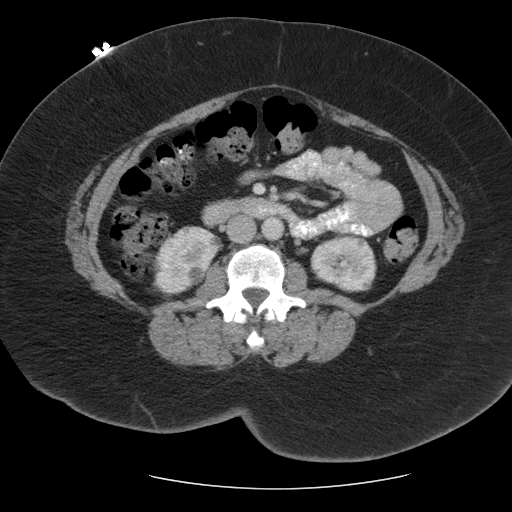
[im 62/93  soft-tissue]
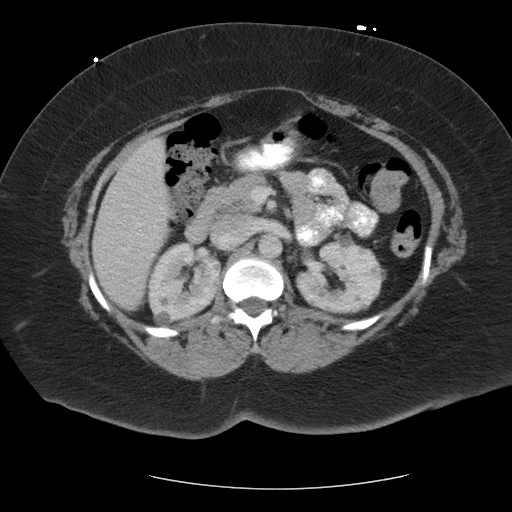
[im 72/93  soft-tissue]
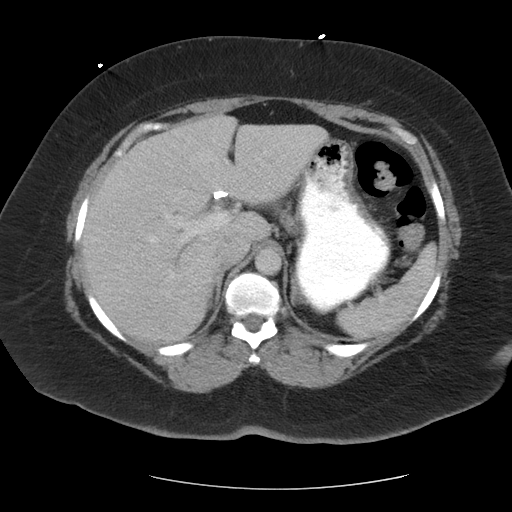
[im 82/93  soft-tissue]
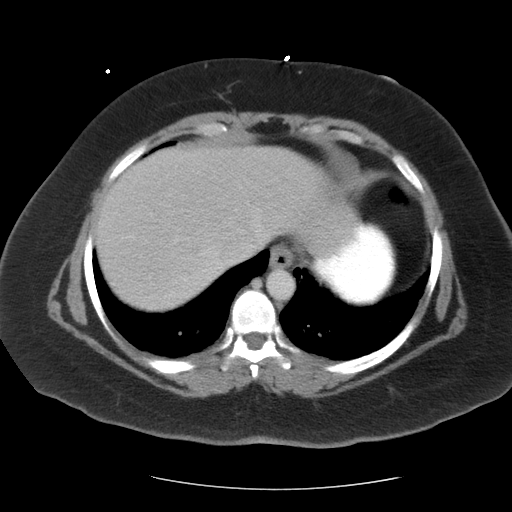

[Series 202: coronals, idose (2) · coronal · 0.45mm/px · 3 of 133 slices shown]
[im 45/133  soft-tissue]
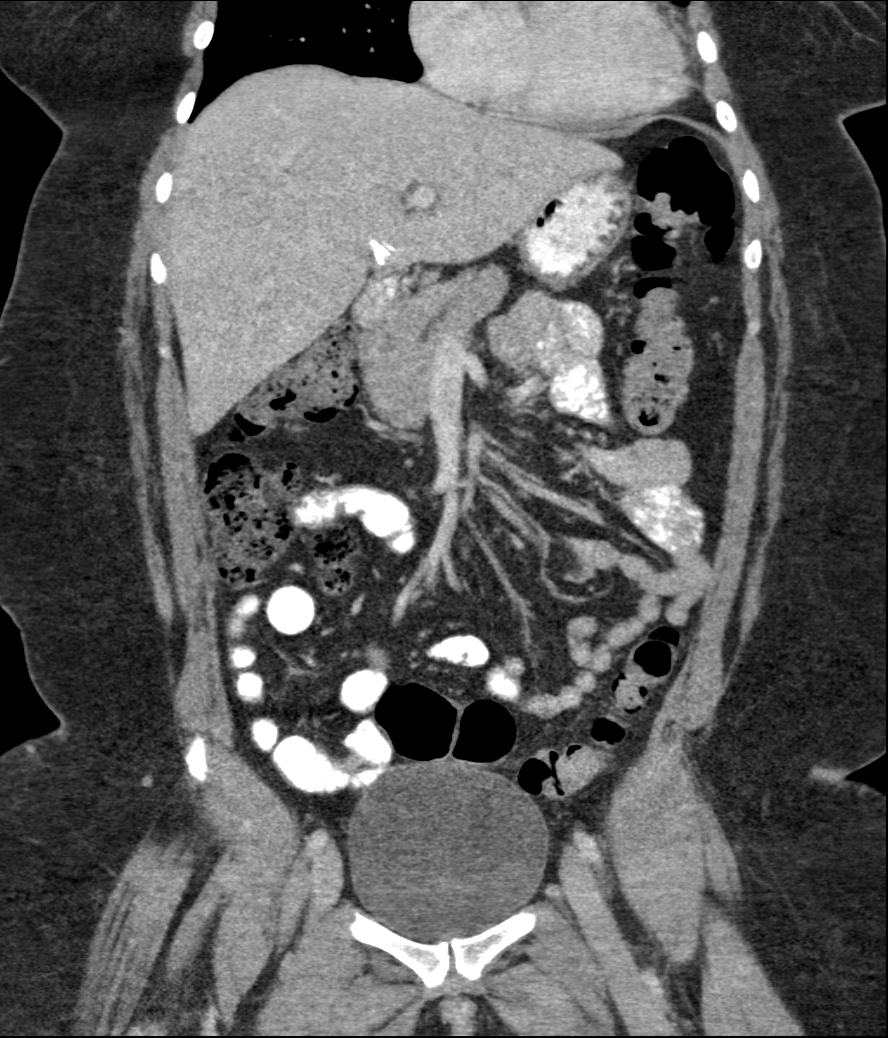
[im 59/133  soft-tissue]
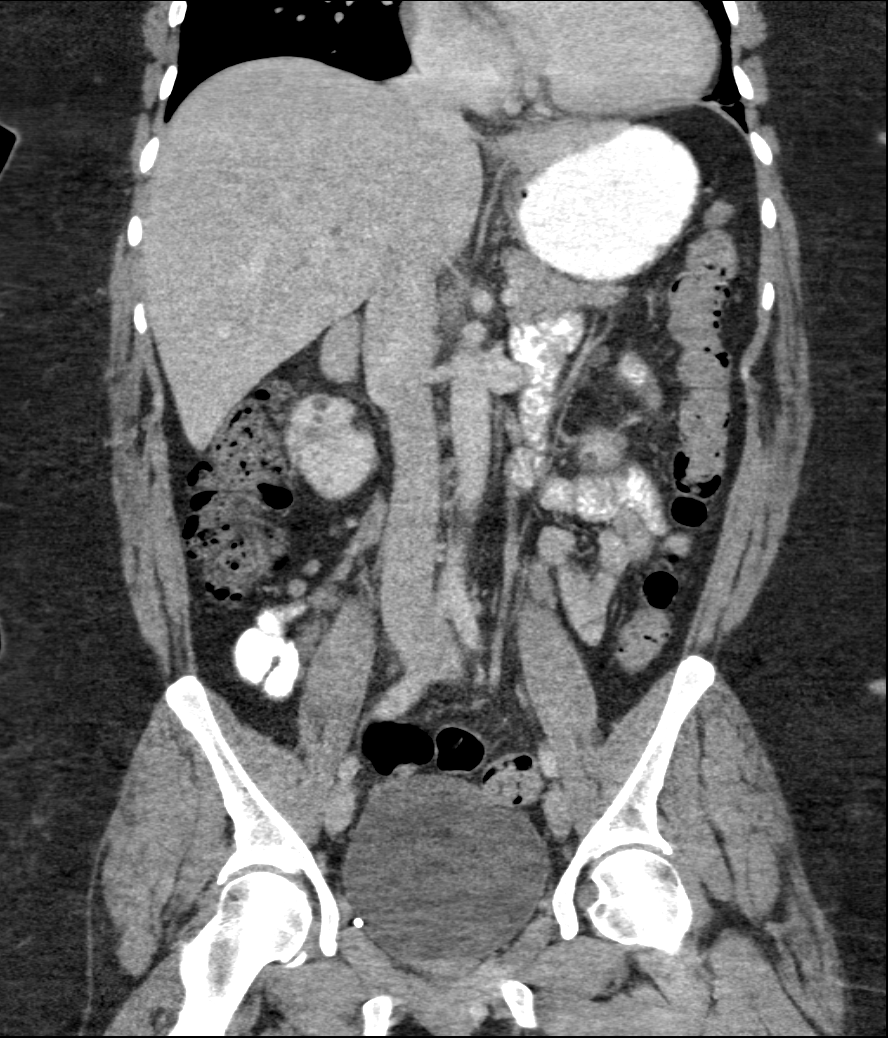
[im 74/133  soft-tissue]
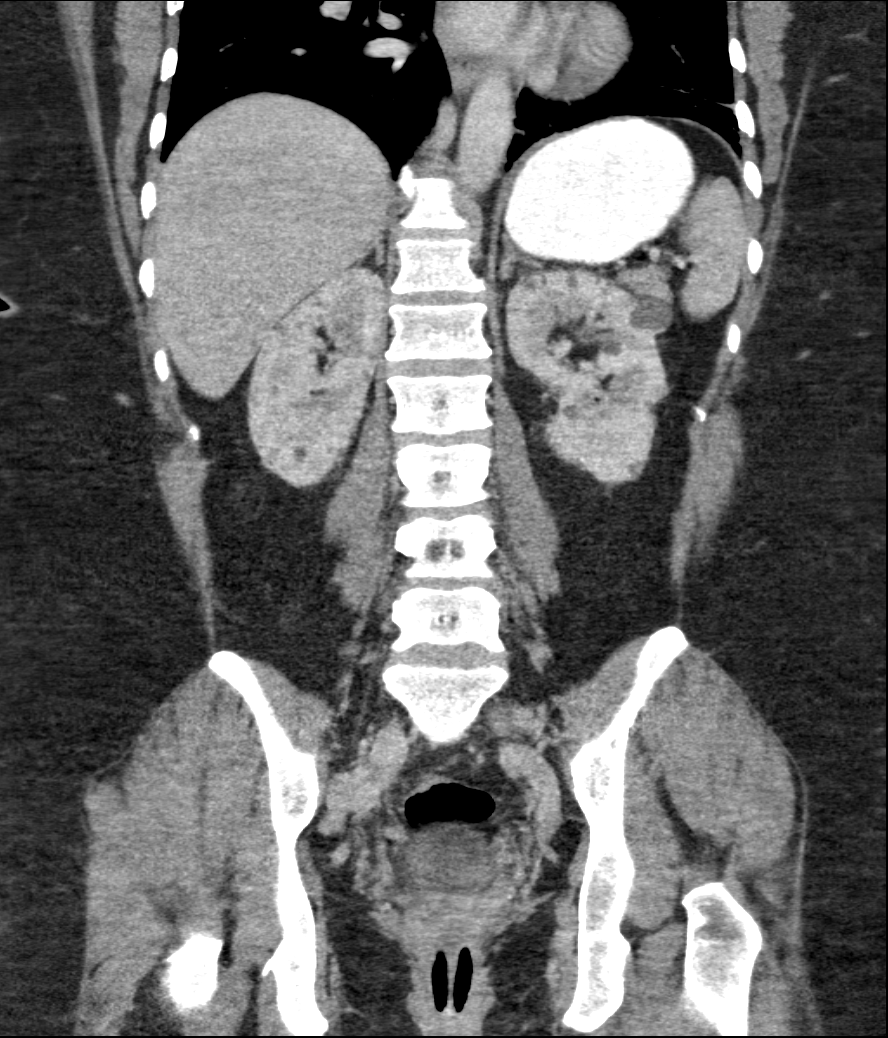

[11 of 46 positions shown; findings below may reference images not displayed]

FINDINGS: There is bronchiectasis in the lower lobes, left greater than right.
No other significant abnormalities are evident in the lower chest.

There are normal appearances of the liver, spleen, pancreas and
adrenals. There are innumerable cysts throughout the kidneys. There
is cholecystectomy. There is hysterectomy. There are unremarkable
appearances of the mesentery and bowel

No acute inflammatory changes are evident in the abdomen or pelvis.
There is no ascites. There is a small fat containing umbilical
hernia. No significant skeletal abnormalities are evident.
IMPRESSION: 1. Bronchiectasis in the lower lobes, left greater than right.
2. Innumerable renal cysts bilaterally
3. Small fat containing umbilical hernia

## 2018-05-25 ENCOUNTER — Encounter (HOSPITAL_COMMUNITY): Payer: Self-pay | Admitting: Emergency Medicine

## 2018-05-25 ENCOUNTER — Other Ambulatory Visit: Payer: Self-pay

## 2018-05-25 ENCOUNTER — Emergency Department (HOSPITAL_COMMUNITY): Payer: BLUE CROSS/BLUE SHIELD

## 2018-05-25 ENCOUNTER — Emergency Department (HOSPITAL_COMMUNITY)
Admission: EM | Admit: 2018-05-25 | Discharge: 2018-05-25 | Disposition: A | Discharge status: Home / Self Care | Payer: BLUE CROSS/BLUE SHIELD | Attending: Emergency Medicine | Admitting: Emergency Medicine

## 2018-05-25 DIAGNOSIS — R748 Abnormal levels of other serum enzymes: Secondary | ICD-10-CM | POA: Insufficient documentation

## 2018-05-25 DIAGNOSIS — R1084 Generalized abdominal pain: Secondary | ICD-10-CM | POA: Diagnosis present

## 2018-05-25 DIAGNOSIS — N201 Calculus of ureter: Secondary | ICD-10-CM | POA: Diagnosis not present

## 2018-05-25 DIAGNOSIS — D509 Iron deficiency anemia, unspecified: Secondary | ICD-10-CM | POA: Diagnosis not present

## 2018-05-25 DIAGNOSIS — R911 Solitary pulmonary nodule: Secondary | ICD-10-CM

## 2018-05-25 LAB — CBC WITH DIFFERENTIAL/PLATELET
Abs Immature Granulocytes: 0 10*3/uL (ref 0.00–0.07)
Basophils Absolute: 0.1 10*3/uL (ref 0.0–0.1)
Basophils Relative: 1 %
Eosinophils Absolute: 0.2 10*3/uL (ref 0.0–0.5)
Eosinophils Relative: 5 %
HCT: 36.5 % (ref 36.0–46.0)
Hemoglobin: 11.2 g/dL — ABNORMAL LOW (ref 12.0–15.0)
Immature Granulocytes: 0 %
Lymphocytes Relative: 38 %
Lymphs Abs: 1.8 10*3/uL (ref 0.7–4.0)
MCH: 24.3 pg — ABNORMAL LOW (ref 26.0–34.0)
MCHC: 30.7 g/dL (ref 30.0–36.0)
MCV: 79.2 fL — ABNORMAL LOW (ref 80.0–100.0)
Monocytes Absolute: 0.4 10*3/uL (ref 0.1–1.0)
Monocytes Relative: 9 %
Neutro Abs: 2.2 10*3/uL (ref 1.7–7.7)
Neutrophils Relative %: 47 %
Platelets: 279 10*3/uL (ref 150–400)
RBC: 4.61 MIL/uL (ref 3.87–5.11)
RDW: 15.2 % (ref 11.5–15.5)
WBC: 4.6 10*3/uL (ref 4.0–10.5)
nRBC: 0 % (ref 0.0–0.2)

## 2018-05-25 LAB — COMPREHENSIVE METABOLIC PANEL
ALT: 15 U/L (ref 0–44)
AST: 17 U/L (ref 15–41)
Albumin: 3.3 g/dL — ABNORMAL LOW (ref 3.5–5.0)
Alkaline Phosphatase: 77 U/L (ref 38–126)
Anion gap: 6 (ref 5–15)
BUN: 13 mg/dL (ref 6–20)
CO2: 23 mmol/L (ref 22–32)
Calcium: 8.8 mg/dL — ABNORMAL LOW (ref 8.9–10.3)
Chloride: 111 mmol/L (ref 98–111)
Creatinine, Ser: 0.9 mg/dL (ref 0.44–1.00)
GFR calc Af Amer: >60 mL/min (ref >60)
GFR calc non Af Amer: >60 mL/min (ref >60)
Glucose, Bld: 111 mg/dL — ABNORMAL HIGH (ref 70–99)
Potassium: 4 mmol/L (ref 3.5–5.1)
Sodium: 140 mmol/L (ref 135–145)
Total Bilirubin: 0.4 mg/dL (ref 0.3–1.2)
Total Protein: 6.7 g/dL (ref 6.5–8.1)

## 2018-05-25 LAB — URINALYSIS, ROUTINE W REFLEX MICROSCOPIC
Bilirubin Urine: NEGATIVE
Glucose, UA: NEGATIVE mg/dL
Ketones, ur: NEGATIVE mg/dL
Leukocytes, UA: NEGATIVE
Nitrite: POSITIVE — AB
Protein, ur: NEGATIVE mg/dL
RBC / HPF: >50 RBC/hpf — ABNORMAL HIGH (ref 0–5)
Specific Gravity, Urine: 1.024 (ref 1.005–1.030)
pH: 6 (ref 5.0–8.0)

## 2018-05-25 LAB — LIPASE, BLOOD: Lipase: 124 U/L — ABNORMAL HIGH (ref 11–51)

## 2018-05-25 MED ORDER — METOCLOPRAMIDE HCL 10 MG PO TABS: 10.0000 mg | ORAL_TABLET | Freq: Four times a day (QID) | ORAL | 0 refills | Status: DC | PRN

## 2018-05-25 MED ORDER — METOCLOPRAMIDE HCL 5 MG/ML IJ SOLN
10.0000 mg | Freq: Once | INTRAMUSCULAR | Status: AC
  Administered 2018-05-25: 10 mg via INTRAVENOUS
  Filled 2018-05-25: qty 2

## 2018-05-25 MED ORDER — MORPHINE SULFATE (PF) 4 MG/ML IV SOLN
4.0000 mg | Freq: Once | INTRAVENOUS | Status: AC
  Administered 2018-05-25: 4 mg via INTRAVENOUS
  Filled 2018-05-25: qty 1

## 2018-05-25 MED ORDER — TAMSULOSIN HCL 0.4 MG PO CAPS: 0.4000 mg | ORAL_CAPSULE | Freq: Every day | ORAL | 0 refills | Status: DC

## 2018-05-25 MED ORDER — ONDANSETRON HCL 4 MG/2ML IJ SOLN
4.0000 mg | Freq: Once | INTRAMUSCULAR | Status: AC
  Administered 2018-05-25: 4 mg via INTRAVENOUS
  Filled 2018-05-25: qty 2

## 2018-05-25 MED ORDER — DIPHENHYDRAMINE HCL 50 MG/ML IJ SOLN
25.0000 mg | Freq: Once | INTRAMUSCULAR | Status: AC
  Administered 2018-05-25: 25 mg via INTRAVENOUS
  Filled 2018-05-25: qty 1

## 2018-05-25 MED ORDER — OXYCODONE-ACETAMINOPHEN 5-325 MG PO TABS: 1.0000 | ORAL_TABLET | ORAL | 0 refills | Status: DC | PRN

## 2018-05-25 MED ORDER — IOPAMIDOL (ISOVUE-300) INJECTION 61%
125.0000 mL | Freq: Once | INTRAVENOUS | Status: AC | PRN
  Administered 2018-05-25: 125 mL via INTRAVENOUS

## 2018-05-25 MED ORDER — MORPHINE SULFATE (PF) 4 MG/ML IV SOLN
4.0000 mg | INTRAVENOUS | Status: DC | PRN
  Administered 2018-05-25 (×2): 4 mg via INTRAVENOUS
  Filled 2018-05-25 (×2): qty 1

## 2018-05-25 MED ORDER — SODIUM CHLORIDE 0.9 % IV BOLUS
1000.0000 mL | Freq: Once | INTRAVENOUS | Status: AC
Start: 2018-05-25 — End: 2018-05-25
  Administered 2018-05-25: 1000 mL via INTRAVENOUS

## 2018-05-25 NOTE — ED Notes (Signed)
Pt made aware we need a urine sample but unable to go at this time. 

## 2018-05-25 NOTE — ED Notes (Addendum)
Pt notified that we still need urine sample. Pt stated that she is unable to urinate right now.

## 2018-05-25 NOTE — ED Notes (Signed)
D/c reviewed with patient and family 

## 2018-05-25 NOTE — ED Notes (Signed)
Urine culture sent down with UA. 

## 2018-05-25 NOTE — Discharge Instructions (Addendum)
Return if pain or nausea are not being adequately controlled at home, or if you start running a fever.  Your CT scan showed a small nodule in your right lung. Because you have never been a smoker, you probably do not need any more testing. If you want to be sure it is benign, you can have a CT scan of the chest done in 12 months to make sure it is not growing.

## 2018-05-25 NOTE — ED Notes (Signed)
Pt transported to CT ?

## 2018-05-25 NOTE — ED Provider Notes (Signed)
[facility] EMERGENCY DEPARTMENT Provider Note   CSN: [id number] Arrival date & time: 05/25/18  0110     History   Chief Complaint Chief Complaint  Patient presents with  . Abdominal Pain  . Back Pain    HPI Connie Mcneil is a 52 y.o. female.  The history is provided by the patient.  She has history of GERD and ileitis and comes in complaining of left flank pain and generalized abdominal pain which started about 1 hour ago.  She describes as severe, burning pain which she rates a 10/10.  There is associated nausea and vomiting.  She denies constipation or diarrhea.  She denies any urinary urgency, frequency, tenesmus, dysuria.  She had been admitted for similar pain about 5 years ago, but does not recall the diagnosis.  At home, she tried taking some ibuprofen, but vomited after taking it.  Nothing makes the pain better, nothing makes it worse.  She denies fever, chills, sweats.  Past Medical History:  Diagnosis Date  . Ileitis    10/2013  . Obesity     Patient Active Problem List   Diagnosis Date Noted  . Gastroesophageal reflux disease without esophagitis 09/04/2014  . Preventative health care 07/13/2014  . Knee pain 10/26/2013  . Obesity, Class III, BMI 40-49.9 (morbid obesity) (HCC) 10/26/2013    Past Surgical History:  Procedure Laterality Date  . APPENDECTOMY    . CHOLECYSTECTOMY    . TOTAL ABDOMINAL HYSTERECTOMY       OB History   None      Home Medications    Prior to Admission medications   Medication Sig Start Date End Date Taking? Authorizing Provider  diclofenac sodium (VOLTAREN) 1 % GEL Apply 4 g topically 4 (four) times daily as needed. Patient not taking: Reported on 05/25/2018 04/26/15   Lora Paula, MD    Family History Family History  Problem Relation Age of Onset  . Diabetes Mother   . Heart disease Mother        chf  . Hypertension Mother     Social History Social History   Tobacco Use  . Smoking  status: Never Smoker  . Smokeless tobacco: Never Used  Substance Use Topics  . Alcohol use: No  . Drug use: No     Allergies   Patient has no known allergies.   Review of Systems Review of Systems  All other systems reviewed and are negative.    Physical Exam Updated Vital Signs BP (!) 142/85 (BP Location: Left Arm)   Pulse 79   Temp 98.3 F (36.8 C) (Oral)   Resp 20   SpO2 98%   Physical Exam  Nursing note and vitals reviewed.  52 year old female, resting comfortably and in no acute distress. Vital signs are significant for mildly elevated systolic blood pressure. Oxygen saturation is 98%, which is normal. Head is normocephalic and atraumatic. PERRLA, EOMI. Oropharynx is clear. Neck is nontender and supple without adenopathy or JVD. Back is nontender in the midline.  There is moderate left CVA tenderness. Lungs are clear without rales, wheezes, or rhonchi. Chest is nontender. Heart has regular rate and rhythm without murmur. Abdomen is soft, flat, with moderate tenderness diffusely.  There is no rebound or guarding.  There are no masses or hepatosplenomegaly and peristalsis is hypoactive. Extremities have no cyanosis or edema, full range of motion is present. Skin is warm and dry without rash. Neurologic: Mental status is normal, cranial nerves are intact,  there are no motor or sensory deficits.  ED Treatments / Results  Labs (all labs ordered are listed, but only abnormal results are displayed) Labs Reviewed  COMPREHENSIVE METABOLIC PANEL - Abnormal; Notable for the following components:      Result Value   Glucose, Bld 111 (*)    Calcium 8.8 (*)    Albumin 3.3 (*)    All other components within normal limits  LIPASE, BLOOD - Abnormal; Notable for the following components:   Lipase 124 (*)    All other components within normal limits  CBC WITH DIFFERENTIAL/PLATELET - Abnormal; Notable for the following components:   Hemoglobin 11.2 (*)    MCV 79.2 (*)     MCH 24.3 (*)    All other components within normal limits  URINALYSIS, ROUTINE W REFLEX MICROSCOPIC - Abnormal; Notable for the following components:   APPearance HAZY (*)    Hgb urine dipstick LARGE (*)    Nitrite POSITIVE (*)    RBC / HPF >50 (*)    Bacteria, UA MANY (*)    All other components within normal limits   Radiology Ct Abdomen Pelvis W Contrast  Result Date: 05/25/2018 CLINICAL DATA:  Abdominal pain, back pain, nausea and vomiting. EXAM: CT ABDOMEN AND PELVIS WITH CONTRAST TECHNIQUE: Multidetector CT imaging of the abdomen and pelvis was performed using the standard protocol following bolus administration of intravenous contrast. CONTRAST:  ISOVUE-300 IOPAMIDOL (ISOVUE-300) INJECTION 61% COMPARISON:  08/24/2014 FINDINGS: Lower chest: Dependent changes in the lung bases. Mild bronchiectasis on the left. 4 mm nodule in the right lung base. Hepatobiliary: No focal liver abnormality is seen. Status post cholecystectomy. No biliary dilatation. Pancreas: Unremarkable. No pancreatic ductal dilatation or surrounding inflammatory changes. Spleen: Normal in size without focal abnormality. Adrenals/Urinary Tract: No adrenal gland nodules. Multiple subcentimeter cysts throughout both kidneys. Possible polycystic renal disease. No hydronephrosis or hydroureter. Bladder is unremarkable. Stomach/Bowel: Stomach, small bowel, and colon are not abnormally distended. Scattered stool throughout the colon. No bowel wall thickening or inflammatory changes. Appendix is not identified. Vascular/Lymphatic: No significant vascular findings are present. No enlarged abdominal or pelvic lymph nodes. Reproductive: Status post hysterectomy. No adnexal masses. Other: No abdominal wall hernia or abnormality. No abdominopelvic ascites. Musculoskeletal: No acute or significant osseous findings. IMPRESSION: 1. No acute process demonstrated in the abdomen or pelvis. No evidence of bowel obstruction or inflammation. 2.  Mild bronchiectasis in the lung bases. 3. 4 mm nodule in the right lung base. No follow-up needed if patient is low-risk. Non-contrast chest CT can be considered in 12 months if patient is high-risk. This recommendation follows the consensus statement: Guidelines for Management of Incidental Pulmonary Nodules Detected on CT Images: From the Fleischner Society 2017; Radiology 2017; 284:228-243. 4. Multiple bilateral renal cysts. Electronically Signed   By: Burman Nieves M.D.   On: 05/25/2018 06:41    Procedures Procedures   Medications Ordered in ED Medications  morphine 4 MG/ML injection 4 mg (4 mg Intravenous Given 05/25/18 0447)  ondansetron (ZOFRAN) injection 4 mg (4 mg Intravenous Given 05/25/18 0241)  sodium chloride 0.9 % bolus 1,000 mL (0 mLs Intravenous Stopped 05/25/18 0609)  iopamidol (ISOVUE-300) 61 % injection 125 mL (125 mLs Intravenous Contrast Given 05/25/18 0607)  morphine 4 MG/ML injection 4 mg (4 mg Intravenous Given 05/25/18 0713)  metoCLOPramide (REGLAN) injection 10 mg (10 mg Intravenous Given 05/25/18 0713)  diphenhydrAMINE (BENADRYL) injection 25 mg (25 mg Intravenous Given 05/25/18 0713)     Initial Impression / Assessment  and Plan / ED Course  I have reviewed the triage vital signs and the nursing notes.  Pertinent labs & imaging results that were available during my care of the patient were reviewed by me and considered in my medical decision making (see chart for details).  Left flank pain and generalized abdominal pain of uncertain cause.  Flank pain raises concern for possible urolithiasis, but urolithiasis should not cause generalized abdominal pain.  Consider diverticulitis, urinary tract infection, perforated viscus.  Old records are reviewed, and she had been admitted in April 2015 for abdominal pain secondary to ileitis.  She required a second injection of morphine and also an injection of metoclopramide to achieve pain and nausea relief.  Urinalysis is  significant for greater than 50 RBCs per high-power field.  Mild anemia is present which is unchanged from baseline.  Lipase is elevated to twice normal, not high enough to make a diagnosis of pancreatitis.  CT scan is read by radiologist as showing no acute process.  On my review of the scan, I feel that there is a small calculus in the proximal left ureter with slight hydronephrosis.  I reviewed the images with the radiologist who is in agreement that there is probably a proximal left renal calculus.  This is consistent with her clinical picture.  She is discharged with prescriptions for oxycodone-acetaminophen, metoclopramide, tamsulosin and is referred to urology for follow-up.  Return precautions discussed.  Incidental finding of right lower lobe nodule is seen the patient.  However, since she is at low risk for lung cancer, no follow-up is necessary, per radiology recommendation.  Final Clinical Impressions(s) / ED Diagnoses   Final diagnoses:  Ureterolithiasis  Microcytic anemia  Lung nodule  Elevated lipase    ED Discharge Orders         Ordered    oxyCODONE-acetaminophen (PERCOCET) 5-325 MG tablet  Every 4 hours PRN     05/25/18 0747    metoCLOPramide (REGLAN) 10 MG tablet  Every 6 hours PRN     05/25/18 0747    tamsulosin (FLOMAX) 0.4 MG CAPS capsule  Daily     05/25/18 0747           Dione BoozeGlick, Brnadon Eoff, MD 05/25/18 [phone]

## 2018-05-25 NOTE — ED Triage Notes (Signed)
Pt c/o abdominal pain, back pain, nausea/vomiting that started 30 minutes PTA. Denies injury/fall to back. Denies urinary symptoms.

## 2019-01-03 ENCOUNTER — Other Ambulatory Visit: Payer: Self-pay | Admitting: Nurse Practitioner

## 2019-01-03 DIAGNOSIS — Z1231 Encounter for screening mammogram for malignant neoplasm of breast: Secondary | ICD-10-CM

## 2019-02-07 ENCOUNTER — Ambulatory Visit
Admission: RE | Admit: 2019-02-07 | Discharge: 2019-02-07 | Disposition: A | Discharge status: Home / Self Care | Payer: BC Managed Care – PPO | Source: Ambulatory Visit | Attending: Nurse Practitioner | Admitting: Nurse Practitioner

## 2019-02-07 ENCOUNTER — Other Ambulatory Visit: Payer: Self-pay

## 2019-02-07 DIAGNOSIS — Z1231 Encounter for screening mammogram for malignant neoplasm of breast: Secondary | ICD-10-CM

## 2020-07-26 ENCOUNTER — Telehealth: Payer: Self-pay | Admitting: Hematology and Oncology

## 2020-07-26 NOTE — Telephone Encounter (Signed)
Received anew hem referral from Courtney Paris , NP for IDA. Pt has been cld and scheduled to see Dr. Pamelia Hoit on 2/7 at 345pm. Pt aware to arrive 15 minutes early.

## 2020-08-07 DIAGNOSIS — D649 Anemia, unspecified: Secondary | ICD-10-CM | POA: Insufficient documentation

## 2020-08-07 NOTE — Assessment & Plan Note (Addendum)
05/02/2019: Hemoglobin 11.2, MCV 81, serum iron 28, TIBC 327, B12 453, folic acid 5.37, ferritin 72, normal LFTs, TSH 1.4 08/22/2019: Hemoglobin 12.1, MCV 81, B12 688, serum iron 33, TIBC 362, ferritin 71, TSH 2.8 03/05/2020: Hemoglobin 11.2, MCV 82, WBC 3.9, ANC 1.3, LFTs: Normal, serum iron 22, TIBC 397, vitamin D 28.8, ferritin 90  Lab review: Overall patient has mild anemia with adequate iron stores although the serum iron and TIBC are leaning towards being mildly iron deficient.  The ferritin could be an acute phase reactant and therefore cannot be completely relied on.  Iron saturation would be useful in this scenario.  Plan: Recheck CBC with differential  Patient was intolerant to oral iron therapy. Therefore periodically we may have to give her iron infusions.

## 2020-08-10 ENCOUNTER — Other Ambulatory Visit: Payer: Self-pay | Admitting: Nurse Practitioner

## 2020-08-10 DIAGNOSIS — Z1231 Encounter for screening mammogram for malignant neoplasm of breast: Secondary | ICD-10-CM

## 2020-08-12 NOTE — Progress Notes (Addendum)
[facility] CONSULT NOTE  Patient Care Team: Pcp, No as PCP - General  CHIEF COMPLAINTS/PURPOSE OF CONSULTATION:  Consult for Anemia  HISTORY OF PRESENTING ILLNESS:  Connie Mcneil 55 y.o. female is here because of recent diagnosis of anemia.  She is referred by Courtney Paris, NP for iron deficiency anemia. Labs on 03/05/20 showed: Hg 11.2, HCT 36.6, platelets 306, iron 22, TIBC 397. She presents to the clinic today for initial evaluation.   She does feel that she has been anemic for the last 30 years.  Recently she was started on oral iron therapy.  She has been intolerant to oral iron with constipation but she is trying her best to take it.  Her last blood work was done in August.  She has been taking on and off iron pills.  She had lost significant amount of weight by taking phentermine and Topamax.   I reviewed her records extensively and collaborated the history with the patient.  MEDICAL HISTORY:  Past Medical History:  Diagnosis Date  . Ileitis    10/2013  . Obesity     SURGICAL HISTORY: Past Surgical History:  Procedure Laterality Date  . APPENDECTOMY    . CHOLECYSTECTOMY    . TOTAL ABDOMINAL HYSTERECTOMY      SOCIAL HISTORY: Social History   Socioeconomic History  . Marital status: Married    Spouse name: Not on file  . Number of children: Not on file  . Years of education: Not on file  . Highest education level: Not on file  Occupational History    Comment: works full time in [employer]  Tobacco Use  . Smoking status: Never Smoker  . Smokeless tobacco: Never Used  Substance and Sexual Activity  . Alcohol use: No  . Drug use: No  . Sexual activity: Not on file  Other Topics Concern  . Not on file  Social History Narrative  . Not on file   Social Determinants of Health   Financial Resource Strain: Not on file  Food Insecurity: Not on file  Transportation Needs: Not on file  Physical Activity: Not on file  Stress: Not on file   Social Connections: Not on file  Intimate Partner Violence: Not on file    FAMILY HISTORY: Family History  Problem Relation Age of Onset  . Diabetes Mother   . Heart disease Mother        chf  . Hypertension Mother     ALLERGIES:  has No Known Allergies.  MEDICATIONS:  Current Outpatient Medications  Medication Sig Dispense Refill  . phentermine 37.5 MG capsule Take 1 capsule (37.5 mg total) by mouth every morning.    . topiramate (TOPAMAX) 100 MG tablet Take 1 tablet (100 mg total) by mouth daily.     No current facility-administered medications for this visit.    REVIEW OF SYSTEMS:   Arthritis problems.  PHYSICAL EXAMINATION: ECOG PERFORMANCE STATUS: 1 - Symptomatic but completely ambulatory  Vitals:   08/13/20 1608  BP: 127/64  Pulse: 73  Resp: 17  Temp: 97.7 F (36.5 C)  SpO2: 100%   Filed Weights   08/13/20 1608  Weight: 215 lb 12.8 oz (97.9 kg)       LABORATORY DATA:  I have reviewed the data as listed Lab Results  Component Value Date   WBC 4.6 05/25/2018   HGB 11.2 (L) 05/25/2018   HCT 36.5 05/25/2018   MCV 79.2 (L) 05/25/2018   PLT 279 05/25/2018   Lab  Results  Component Value Date   NA 140 05/25/2018   K 4.0 05/25/2018   CL 111 05/25/2018   CO2 23 05/25/2018    RADIOGRAPHIC STUDIES: I have personally reviewed the radiological reports and agreed with the findings in the report.  ASSESSMENT AND PLAN:  Normocytic anemia 05/02/2019: Hemoglobin 11.2, MCV 81, serum iron 28, TIBC 327, B12 453, folic acid 5.37, ferritin 72, normal LFTs, TSH 1.4 08/22/2019: Hemoglobin 12.1, MCV 81, B12 688, serum iron 33, TIBC 362, ferritin 71, TSH 2.8 03/05/2020: Hemoglobin 11.2, MCV 82, WBC 3.9, ANC 1.3, LFTs: Normal, serum iron 22, TIBC 397, vitamin D 28.8, ferritin 90  Lab review: Overall patient has mild anemia with adequate iron stores although the serum iron and TIBC are leaning towards being mildly iron deficient.  The ferritin could be an acute phase  reactant and therefore cannot be completely relied on.  Iron saturation would be useful in this scenario.  Plan: Recheck CBC with differential, iron studies, B12, folic acid and CMP. Patient was intolerant to oral iron therapy.  However she is able to tolerate some amount of oral iron. We discussed the role of intravenous iron therapy.  She is not very keen on it.  I will call her tomorrow with the result of today's blood work and then we will make a decision on that.     All questions were answered. The patient knows to call the clinic with any problems, questions or concerns.    Sabas Sous, MD, MPH 08/13/2020    I, Molly Dorshimer, am acting as scribe for Serena Croissant, MD.  I have reviewed the above documentation for accuracy and completeness, and I agree with the above.  ADDENDUM: Reviewed her labs and called her with them (Hb 11.3 stable, Ferritin 56, Iron sat 11%) - continue OTC iron - take SL B 12 5000 mcg daily (B 12 level was 200) - recheck labs in 6 months

## 2020-08-13 ENCOUNTER — Inpatient Hospital Stay: Payer: BC Managed Care – PPO

## 2020-08-13 ENCOUNTER — Other Ambulatory Visit: Payer: Self-pay

## 2020-08-13 ENCOUNTER — Inpatient Hospital Stay: Payer: BC Managed Care – PPO | Attending: Hematology and Oncology | Admitting: Hematology and Oncology

## 2020-08-13 DIAGNOSIS — E669 Obesity, unspecified: Secondary | ICD-10-CM | POA: Insufficient documentation

## 2020-08-13 DIAGNOSIS — K5900 Constipation, unspecified: Secondary | ICD-10-CM | POA: Insufficient documentation

## 2020-08-13 DIAGNOSIS — D649 Anemia, unspecified: Secondary | ICD-10-CM

## 2020-08-13 DIAGNOSIS — D509 Iron deficiency anemia, unspecified: Secondary | ICD-10-CM | POA: Insufficient documentation

## 2020-08-13 DIAGNOSIS — Z8249 Family history of ischemic heart disease and other diseases of the circulatory system: Secondary | ICD-10-CM | POA: Insufficient documentation

## 2020-08-13 DIAGNOSIS — Z9049 Acquired absence of other specified parts of digestive tract: Secondary | ICD-10-CM | POA: Insufficient documentation

## 2020-08-13 DIAGNOSIS — Z833 Family history of diabetes mellitus: Secondary | ICD-10-CM | POA: Diagnosis not present

## 2020-08-13 DIAGNOSIS — Z79899 Other long term (current) drug therapy: Secondary | ICD-10-CM | POA: Insufficient documentation

## 2020-08-13 LAB — CMP (CANCER CENTER ONLY)
ALT: 11 U/L (ref 0–44)
AST: 16 U/L (ref 15–41)
Albumin: 3.7 g/dL (ref 3.5–5.0)
Alkaline Phosphatase: 79 U/L (ref 38–126)
Anion gap: 6 (ref 5–15)
BUN: 13 mg/dL (ref 6–20)
CO2: 23 mmol/L (ref 22–32)
Calcium: 9 mg/dL (ref 8.9–10.3)
Chloride: 109 mmol/L (ref 98–111)
Creatinine: 0.92 mg/dL (ref 0.44–1.00)
GFR, Estimated: >60 mL/min (ref >60)
Glucose, Bld: 94 mg/dL (ref 70–99)
Potassium: 3.9 mmol/L (ref 3.5–5.1)
Sodium: 138 mmol/L (ref 135–145)
Total Bilirubin: 0.4 mg/dL (ref 0.3–1.2)
Total Protein: 7.4 g/dL (ref 6.5–8.1)

## 2020-08-13 LAB — CBC WITH DIFFERENTIAL (CANCER CENTER ONLY)
Abs Immature Granulocytes: 0 10*3/uL (ref 0.00–0.07)
Basophils Absolute: 0 10*3/uL (ref 0.0–0.1)
Basophils Relative: 1 %
Eosinophils Absolute: 0.2 10*3/uL (ref 0.0–0.5)
Eosinophils Relative: 5 %
HCT: 36.6 % (ref 36.0–46.0)
Hemoglobin: 11.3 g/dL — ABNORMAL LOW (ref 12.0–15.0)
Immature Granulocytes: 0 %
Lymphocytes Relative: 48 %
Lymphs Abs: 1.9 10*3/uL (ref 0.7–4.0)
MCH: 24.6 pg — ABNORMAL LOW (ref 26.0–34.0)
MCHC: 30.9 g/dL (ref 30.0–36.0)
MCV: 79.7 fL — ABNORMAL LOW (ref 80.0–100.0)
Monocytes Absolute: 0.3 10*3/uL (ref 0.1–1.0)
Monocytes Relative: 6 %
Neutro Abs: 1.6 10*3/uL — ABNORMAL LOW (ref 1.7–7.7)
Neutrophils Relative %: 40 %
Platelet Count: 296 10*3/uL (ref 150–400)
RBC: 4.59 MIL/uL (ref 3.87–5.11)
RDW: 15.5 % (ref 11.5–15.5)
WBC Count: 4.1 10*3/uL (ref 4.0–10.5)
nRBC: 0 % (ref 0.0–0.2)

## 2020-08-13 LAB — VITAMIN B12: Vitamin B-12: 200 pg/mL (ref 180–914)

## 2020-08-13 LAB — FOLATE: Folate: 10.2 ng/mL (ref >5.9)

## 2020-08-13 MED ORDER — TOPIRAMATE 100 MG PO TABS: 100.0000 mg | ORAL_TABLET | Freq: Every day | ORAL | Status: AC

## 2020-08-13 MED ORDER — PHENTERMINE HCL 37.5 MG PO CAPS: 37.5000 mg | ORAL_CAPSULE | ORAL | Status: AC

## 2020-08-14 LAB — IRON AND TIBC
Iron: 37 ug/dL — ABNORMAL LOW (ref 41–142)
Saturation Ratios: 11 % — ABNORMAL LOW (ref 21–57)
TIBC: 320 ug/dL (ref 236–444)
UIBC: 284 ug/dL (ref 120–384)

## 2020-08-14 LAB — FERRITIN: Ferritin: 59 ng/mL (ref 11–307)

## 2020-08-14 NOTE — Addendum Note (Signed)
Addended by: Serena Croissant on: 08/14/2020 02:05 PM   Modules accepted: Orders

## 2020-08-15 ENCOUNTER — Telehealth: Payer: Self-pay | Admitting: Hematology and Oncology

## 2020-08-15 NOTE — Telephone Encounter (Signed)
Called pt per 2/8 sch msg - unable to reach pt . Left message with appt and mailed reminder letter .

## 2020-09-10 ENCOUNTER — Inpatient Hospital Stay: Admission: RE | Admit: 2020-09-10 | Payer: BC Managed Care – PPO | Source: Ambulatory Visit

## 2020-09-10 DIAGNOSIS — Z1231 Encounter for screening mammogram for malignant neoplasm of breast: Secondary | ICD-10-CM

## 2020-09-11 ENCOUNTER — Other Ambulatory Visit: Payer: Self-pay

## 2020-09-11 ENCOUNTER — Ambulatory Visit
Admission: RE | Admit: 2020-09-11 | Discharge: 2020-09-11 | Disposition: A | Discharge status: Home / Self Care | Payer: BC Managed Care – PPO | Source: Ambulatory Visit | Attending: Nurse Practitioner | Admitting: Nurse Practitioner

## 2020-09-11 DIAGNOSIS — Z1231 Encounter for screening mammogram for malignant neoplasm of breast: Secondary | ICD-10-CM

## 2021-02-12 ENCOUNTER — Inpatient Hospital Stay: Payer: BC Managed Care – PPO | Attending: Hematology and Oncology

## 2021-02-13 ENCOUNTER — Inpatient Hospital Stay: Payer: BC Managed Care – PPO | Admitting: Hematology and Oncology

## 2021-09-11 ENCOUNTER — Other Ambulatory Visit: Payer: Self-pay

## 2021-09-11 ENCOUNTER — Encounter (HOSPITAL_COMMUNITY): Payer: Self-pay

## 2021-09-11 ENCOUNTER — Emergency Department (HOSPITAL_COMMUNITY): Payer: BC Managed Care – PPO

## 2021-09-11 ENCOUNTER — Inpatient Hospital Stay (HOSPITAL_COMMUNITY)
Admission: EM | Admit: 2021-09-11 | Discharge: 2021-09-16 | DRG: 872 | Disposition: A | Discharge status: Home Health | Payer: BC Managed Care – PPO | Attending: Internal Medicine | Admitting: Internal Medicine

## 2021-09-11 DIAGNOSIS — M545 Low back pain, unspecified: Secondary | ICD-10-CM

## 2021-09-11 DIAGNOSIS — N390 Urinary tract infection, site not specified: Secondary | ICD-10-CM

## 2021-09-11 DIAGNOSIS — Z79899 Other long term (current) drug therapy: Secondary | ICD-10-CM

## 2021-09-11 DIAGNOSIS — N12 Tubulo-interstitial nephritis, not specified as acute or chronic: Principal | ICD-10-CM | POA: Diagnosis present

## 2021-09-11 DIAGNOSIS — Z6841 Body Mass Index (BMI) 40.0 and over, adult: Secondary | ICD-10-CM

## 2021-09-11 DIAGNOSIS — R6520 Severe sepsis without septic shock: Secondary | ICD-10-CM

## 2021-09-11 DIAGNOSIS — A419 Sepsis, unspecified organism: Secondary | ICD-10-CM

## 2021-09-11 DIAGNOSIS — E876 Hypokalemia: Secondary | ICD-10-CM

## 2021-09-11 DIAGNOSIS — E669 Obesity, unspecified: Secondary | ICD-10-CM | POA: Diagnosis present

## 2021-09-11 DIAGNOSIS — K219 Gastro-esophageal reflux disease without esophagitis: Secondary | ICD-10-CM | POA: Diagnosis present

## 2021-09-11 DIAGNOSIS — E559 Vitamin D deficiency, unspecified: Secondary | ICD-10-CM

## 2021-09-11 DIAGNOSIS — A4150 Gram-negative sepsis, unspecified: Principal | ICD-10-CM | POA: Diagnosis present

## 2021-09-11 DIAGNOSIS — Z20822 Contact with and (suspected) exposure to covid-19: Secondary | ICD-10-CM | POA: Diagnosis present

## 2021-09-11 DIAGNOSIS — M549 Dorsalgia, unspecified: Secondary | ICD-10-CM

## 2021-09-11 LAB — COMPREHENSIVE METABOLIC PANEL
ALT: 16 U/L (ref 0–44)
AST: 20 U/L (ref 15–41)
Albumin: 3.5 g/dL (ref 3.5–5.0)
Alkaline Phosphatase: 74 U/L (ref 38–126)
Anion gap: 9 (ref 5–15)
BUN: 11 mg/dL (ref 6–20)
CO2: 18 mmol/L — ABNORMAL LOW (ref 22–32)
Calcium: 9 mg/dL (ref 8.9–10.3)
Chloride: 109 mmol/L (ref 98–111)
Creatinine, Ser: 0.89 mg/dL (ref 0.44–1.00)
GFR, Estimated: >60 mL/min (ref >60)
Glucose, Bld: 149 mg/dL — ABNORMAL HIGH (ref 70–99)
Potassium: 3.2 mmol/L — ABNORMAL LOW (ref 3.5–5.1)
Sodium: 136 mmol/L (ref 135–145)
Total Bilirubin: 0.9 mg/dL (ref 0.3–1.2)
Total Protein: 7.2 g/dL (ref 6.5–8.1)

## 2021-09-11 LAB — CBC WITH DIFFERENTIAL/PLATELET
Abs Immature Granulocytes: 0.03 10*3/uL (ref 0.00–0.07)
Basophils Absolute: 0 10*3/uL (ref 0.0–0.1)
Basophils Relative: 0 %
Eosinophils Absolute: 0.1 10*3/uL (ref 0.0–0.5)
Eosinophils Relative: 1 %
HCT: 36 % (ref 36.0–46.0)
Hemoglobin: 11.4 g/dL — ABNORMAL LOW (ref 12.0–15.0)
Immature Granulocytes: 0 %
Lymphocytes Relative: 8 %
Lymphs Abs: 0.6 10*3/uL — ABNORMAL LOW (ref 0.7–4.0)
MCH: 24.5 pg — ABNORMAL LOW (ref 26.0–34.0)
MCHC: 31.7 g/dL (ref 30.0–36.0)
MCV: 77.4 fL — ABNORMAL LOW (ref 80.0–100.0)
Monocytes Absolute: 0.2 10*3/uL (ref 0.1–1.0)
Monocytes Relative: 3 %
Neutro Abs: 6.1 10*3/uL (ref 1.7–7.7)
Neutrophils Relative %: 88 %
Platelets: 336 10*3/uL (ref 150–400)
RBC: 4.65 MIL/uL (ref 3.87–5.11)
RDW: 15.2 % (ref 11.5–15.5)
WBC: 7 10*3/uL (ref 4.0–10.5)
nRBC: 0 % (ref 0.0–0.2)

## 2021-09-11 LAB — PROTIME-INR
INR: 1.1 (ref 0.8–1.2)
Prothrombin Time: 13.7 seconds (ref 11.4–15.2)

## 2021-09-11 LAB — RESP PANEL BY RT-PCR (FLU A&B, COVID) ARPGX2
Influenza A by PCR: NEGATIVE
Influenza B by PCR: NEGATIVE
SARS Coronavirus 2 by RT PCR: NEGATIVE

## 2021-09-11 LAB — I-STAT BETA HCG BLOOD, ED (MC, WL, AP ONLY): I-stat hCG, quantitative: 8.2 m[IU]/mL — ABNORMAL HIGH (ref <5)

## 2021-09-11 LAB — LACTIC ACID, PLASMA: Lactic Acid, Venous: 1.3 mmol/L (ref 0.5–1.9)

## 2021-09-11 MED ORDER — SODIUM CHLORIDE 0.9 % IV BOLUS
1000.0000 mL | Freq: Once | INTRAVENOUS | Status: AC
Start: 2021-09-11 — End: 2021-09-12
  Administered 2021-09-11: 1000 mL via INTRAVENOUS

## 2021-09-11 MED ORDER — ACETAMINOPHEN 500 MG PO TABS
1000.0000 mg | ORAL_TABLET | Freq: Once | ORAL | Status: AC
  Administered 2021-09-11: 1000 mg via ORAL
  Filled 2021-09-11: qty 2

## 2021-09-11 MED ORDER — IBUPROFEN 800 MG PO TABS
800.0000 mg | ORAL_TABLET | Freq: Once | ORAL | Status: AC
Start: 2021-09-11 — End: 2021-09-11
  Administered 2021-09-11: 800 mg via ORAL
  Filled 2021-09-11: qty 1

## 2021-09-11 MED ORDER — ONDANSETRON HCL 4 MG/2ML IJ SOLN
4.0000 mg | Freq: Once | INTRAMUSCULAR | Status: AC
  Administered 2021-09-11: 4 mg via INTRAVENOUS
  Filled 2021-09-11: qty 2

## 2021-09-11 MED ORDER — KETOROLAC TROMETHAMINE 30 MG/ML IJ SOLN
30.0000 mg | Freq: Once | INTRAMUSCULAR | Status: AC
  Administered 2021-09-11: 30 mg via INTRAVENOUS
  Filled 2021-09-11: qty 1

## 2021-09-11 NOTE — ED Triage Notes (Signed)
Pt reports she is here today due to lower back pain and N&V&D. Pt reports weakness. Pt reports fevers at home was 103.14F. Pt denies any PMH. Pt denies any urinary s/s. Pt reports body aches all over. Pt reports Advil at 7pm.  ?

## 2021-09-11 NOTE — ED Provider Notes (Signed)
?[facility] ?Provider Note ? ? ?CSN: [id number] ?Arrival date & time: 09/11/21  2142 ? ?  ? ?History ? ?Chief Complaint  ?Patient presents with  ? Chills  ? Fever  ? ? ?Connie Mcneil is a 56 y.o. female. ? ?The history is provided by the patient and medical records.  ?Fever ?Associated symptoms: chills   ? ?56 year old female with history of acid reflux, obesity, iron deficiency anemia, presenting to the ED with fever and generalized weakness.  States she started feeling poorly Monday night and has been progressively worsening.  She reports a lot of difficulty eating and drinking due to severe nausea.  She did have 1 episode of emesis and one episode of loose stool.  States she just feels very fatigued and worn out with no energy.  Also reports low back pain, states all the way across her low back described as dull and aching.  She has not had any numbness or weakness of the legs.  No bowel or bladder incontinence.  Does report urine has been dark in color but no dysuria or frequency reported.  She did take Advil prior to arrival around 7 PM. ? ?Home Medications ?Prior to Admission medications   ?Medication Sig Start Date End Date Taking? Authorizing Provider  ?phentermine 37.5 MG capsule Take 1 capsule (37.5 mg total) by mouth every morning. 08/13/20   Serena CroissantGudena, Vinay, MD  ?topiramate (TOPAMAX) 100 MG tablet Take 1 tablet (100 mg total) by mouth daily. 08/13/20   Serena CroissantGudena, Vinay, MD  ?   ? ?Allergies    ?Patient has no known allergies.   ? ?Review of Systems   ?Review of Systems  ?Constitutional:  Positive for chills, fatigue and fever.  ?Musculoskeletal:  Positive for back pain.  ?All other systems reviewed and are negative. ? ?Physical Exam ?Updated Vital Signs ?BP (!) 102/50 (BP Location: Right Arm)   Pulse (!) 110   Temp (!) 102.7 ?F (39.3 ?C) (Oral)   Resp 18   SpO2 95%  ? ?Physical Exam ?Vitals and nursing note reviewed.  ?Constitutional:   ?   Appearance: She is  well-developed.  ?   Comments: Appears to be feeling poorly, generally weak  ?HENT:  ?   Head: Normocephalic and atraumatic.  ?   Mouth/Throat:  ?   Mouth: Mucous membranes are dry.  ?Eyes:  ?   Conjunctiva/sclera: Conjunctivae normal.  ?   Pupils: Pupils are equal, round, and reactive to light.  ?Cardiovascular:  ?   Rate and Rhythm: Normal rate and regular rhythm.  ?   Heart sounds: Normal heart sounds.  ?Pulmonary:  ?   Effort: Pulmonary effort is normal.  ?   Breath sounds: Normal breath sounds.  ?Abdominal:  ?   General: Bowel sounds are normal.  ?   Palpations: Abdomen is soft.  ?Musculoskeletal:     ?   General: Normal range of motion.  ?   Cervical back: Normal range of motion.  ?Skin: ?   General: Skin is warm and dry.  ?Neurological:  ?   Mental Status: She is alert and oriented to person, place, and time.  ? ? ?ED Results / Procedures / Treatments   ?Labs ?(all labs ordered are listed, but only abnormal results are displayed) ?Labs Reviewed  ?COMPREHENSIVE METABOLIC PANEL - Abnormal; Notable for the following components:  ?    Result Value  ? Potassium 3.2 (*)   ? CO2 18 (*)   ? Glucose, Bld  149 (*)   ? All other components within normal limits  ?CBC WITH DIFFERENTIAL/PLATELET - Abnormal; Notable for the following components:  ? Hemoglobin 11.4 (*)   ? MCV 77.4 (*)   ? MCH 24.5 (*)   ? Lymphs Abs 0.6 (*)   ? All other components within normal limits  ?I-STAT BETA HCG BLOOD, ED ([facility], [facility], [facility] ONLY) - Abnormal; Notable for the following components:  ? I-stat hCG, quantitative 8.2 (*)   ? All other components within normal limits  ?RESP PANEL BY RT-PCR (FLU A&B, COVID) [id number]  ?CULTURE, BLOOD (ROUTINE X 2)  ?CULTURE, BLOOD (ROUTINE X 2)  ?URINE CULTURE  ?LACTIC ACID, PLASMA  ?LACTIC ACID, PLASMA  ?PROTIME-INR  ?URINALYSIS, ROUTINE W REFLEX MICROSCOPIC  ? ? ?EKG ?None ? ?Radiology ?DG Chest 2 View ? ?Result Date: 09/11/2021 ?CLINICAL DATA:  Shortness of breath with fever and chills. EXAM: CHEST - 2 VIEW  COMPARISON:  08/23/2014 FINDINGS: Upper normal heart size. Subsegmental atelectasis and or scarring at the left lung base. No confluent consolidation, pleural effusion, or pneumothorax. No pulmonary edema. No acute osseous findings. IMPRESSION: Subsegmental atelectasis and or scarring at the left lung base. Electronically Signed   By: Narda Rutherford M.D.   On: 09/11/2021 22:50   ? ?Procedures ?Procedures  ? ? ?CRITICAL CARE ?Performed by: Garlon Hatchet ? ? ?Total critical care time: 45 minutes ? ?Critical care time was exclusive of separately billable procedures and treating other patients. ? ?Critical care was necessary to treat or prevent imminent or life-threatening deterioration. ? ?Critical care was time spent personally by me on the following activities: development of treatment plan with patient and/or surrogate as well as nursing, discussions with consultants, evaluation of patient's response to treatment, examination of patient, obtaining history from patient or surrogate, ordering and performing treatments and interventions, ordering and review of laboratory studies, ordering and review of radiographic studies, pulse oximetry and re-evaluation of patient's condition. ? ? ?Medications Ordered in ED ?Medications  ?sodium chloride 0.9 % bolus 1,000 mL (has no administration in time range)  ?acetaminophen (TYLENOL) tablet 1,000 mg (1,000 mg Oral Given 09/11/21 2209)  ?ibuprofen (ADVIL) tablet 800 mg (800 mg Oral Given 09/11/21 2209)  ?sodium chloride 0.9 % bolus 1,000 mL (1,000 mLs Intravenous New Bag/Given 09/11/21 2320)  ?ondansetron [facility]) injection 4 mg (4 mg Intravenous Given 09/11/21 2320)  ?ketorolac (TORADOL) 30 MG/ML injection 30 mg (30 mg Intravenous Given 09/11/21 2319)  ? ? ?ED Course/ Medical Decision Making/ A&P ?  ?                        ?Medical Decision Making ?Amount and/or Complexity of Data Reviewed ?External Data Reviewed: labs and notes. ?Labs: ordered. ?Radiology: ordered and independent  interpretation performed. ?ECG/medicine tests: ordered and independent interpretation performed. ? ?Risk ?Prescription drug management. ?Decision regarding hospitalization. ? ? ?56 year old female presenting to the ED with fever.  Began Monday evening.  Reports generalized weakness, fatigue, and chills.  Also reports poor appetite.  Febrile here and appears to be feeling poorly.  She denies any cough or URI symptoms.  Does admit to some dark urine but attributed this to dehydration.  She does appear clinically dry on exam.  Labs pending including lactate, cultures, COVID/flu screen, UA.  Chest x-ray has been performed already, reviewed-- atelectasis vs scarring left lung base.  Will give IVF, zofran, toradol.  Will reassess. ? ?Labs thus far reassuring--normal WBC count, normal lactate.  Covid/flu  screen negative.  Awaiting UA. ? ?1:28 AM ?Patient went to bathroom and now hypotensive into the 90's systolic.  Unable to provide urine sample.  Still complaining of quite a bit of pain in her lower back.  Unclear etiology of her fever at this point.   Will give additional fluids and plan for CT chest/abdomen/pelvis. ? ?Repeat lactate remains normal.  CT with findings of pyelonephritis.  Pressure still soft but stable around 100 systolic, she remains AAOx3.  Still not able to provide urine sample but will start IV rocephin based on CT findings.  Given tenuous pressures, will admit for observation. ? ?Discussed with Dr. Arville Care-- will admit for ongoing care. ? ?Final Clinical Impression(s) / ED Diagnoses ?Final diagnoses:  ?Pyelonephritis  ? ? ?Rx / DC Orders ?ED Discharge Orders   ? ? None  ? ?  ? ? ?  ?Garlon Hatchet, PA-C ?09/12/21 0300 ? ?  ?Marily Memos, MD ?09/12/21 0355 ? ?

## 2021-09-11 NOTE — ED Provider Triage Note (Signed)
Emergency Medicine Provider Triage Evaluation Note ? ?Connie Mcneil , a 55 y.o. female  was evaluated in triage.  Pt complains of fever, chills, body aches, fatigue, loss of appetite for the last 3 days.  Patient denies any abdominal pain, nausea vomiting, dysuria, hematuria, diarrhea, shortness of breath, chest pain.  She denies history of drug use, denies any significant past medical history.  She reports she has significant lower back pain without known injury, chronic corticosteroid use, history of cancer.  No recent sick contacts. ? ?Review of Systems  ?Positive: Fever, chills, bodyaches ?Negative: Chest pain, shob, abdominal pain, NVD ? ?Physical Exam  ?BP (!) 102/50 (BP Location: Right Arm)   Pulse (!) 110   Temp (!) 102.7 ?F (39.3 ?C) (Oral)   Resp 18   SpO2 95%  ?Gen:   Awake, ill appearing, febrile ?Resp:  Normal effort  ?MSK:   Moves extremities without difficulty  ?Other:  Some TTP in abdomen generally, no focal tenderness ? ?Medical Decision Making  ?Medically screening exam initiated at 9:52 PM.  Appropriate orders placed.  Connie Mcneil was informed that the remainder of the evaluation will be completed by another provider, this initial triage assessment does not replace that evaluation, and the importance of remaining in the ED until their evaluation is complete. ? ?Workup initiated ?  ?Olene Floss, PA-C ?09/11/21 2154 ? ?

## 2021-09-12 ENCOUNTER — Emergency Department (HOSPITAL_COMMUNITY): Payer: BC Managed Care – PPO

## 2021-09-12 DIAGNOSIS — A4150 Gram-negative sepsis, unspecified: Secondary | ICD-10-CM

## 2021-09-12 DIAGNOSIS — N12 Tubulo-interstitial nephritis, not specified as acute or chronic: Secondary | ICD-10-CM | POA: Diagnosis present

## 2021-09-12 DIAGNOSIS — N390 Urinary tract infection, site not specified: Secondary | ICD-10-CM | POA: Diagnosis not present

## 2021-09-12 DIAGNOSIS — E559 Vitamin D deficiency, unspecified: Secondary | ICD-10-CM

## 2021-09-12 DIAGNOSIS — E876 Hypokalemia: Secondary | ICD-10-CM

## 2021-09-12 DIAGNOSIS — A419 Sepsis, unspecified organism: Secondary | ICD-10-CM

## 2021-09-12 LAB — CBC
HCT: 33 % — ABNORMAL LOW (ref 36.0–46.0)
Hemoglobin: 10.5 g/dL — ABNORMAL LOW (ref 12.0–15.0)
MCH: 25.2 pg — ABNORMAL LOW (ref 26.0–34.0)
MCHC: 31.8 g/dL (ref 30.0–36.0)
MCV: 79.3 fL — ABNORMAL LOW (ref 80.0–100.0)
Platelets: 300 10*3/uL (ref 150–400)
RBC: 4.16 MIL/uL (ref 3.87–5.11)
RDW: 15.4 % (ref 11.5–15.5)
WBC: 5.1 10*3/uL (ref 4.0–10.5)
nRBC: 0 % (ref 0.0–0.2)

## 2021-09-12 LAB — BASIC METABOLIC PANEL
Anion gap: 8 (ref 5–15)
BUN: 10 mg/dL (ref 6–20)
CO2: 17 mmol/L — ABNORMAL LOW (ref 22–32)
Calcium: 8 mg/dL — ABNORMAL LOW (ref 8.9–10.3)
Chloride: 114 mmol/L — ABNORMAL HIGH (ref 98–111)
Creatinine, Ser: 0.84 mg/dL (ref 0.44–1.00)
GFR, Estimated: >60 mL/min (ref >60)
Glucose, Bld: 118 mg/dL — ABNORMAL HIGH (ref 70–99)
Potassium: 3.9 mmol/L (ref 3.5–5.1)
Sodium: 139 mmol/L (ref 135–145)

## 2021-09-12 LAB — URINALYSIS, ROUTINE W REFLEX MICROSCOPIC
Bilirubin Urine: NEGATIVE
Glucose, UA: NEGATIVE mg/dL
Ketones, ur: NEGATIVE mg/dL
Nitrite: POSITIVE — AB
Protein, ur: 30 mg/dL — AB
Specific Gravity, Urine: >1.046 — ABNORMAL HIGH (ref 1.005–1.030)
pH: 5 (ref 5.0–8.0)

## 2021-09-12 LAB — MAGNESIUM: Magnesium: 1.9 mg/dL (ref 1.7–2.4)

## 2021-09-12 LAB — HIV ANTIBODY (ROUTINE TESTING W REFLEX): HIV Screen 4th Generation wRfx: NONREACTIVE

## 2021-09-12 LAB — PROCALCITONIN: Procalcitonin: 0.36 ng/mL

## 2021-09-12 LAB — CORTISOL-AM, BLOOD: Cortisol - AM: 19.2 ug/dL (ref 6.7–22.6)

## 2021-09-12 LAB — LACTIC ACID, PLASMA: Lactic Acid, Venous: 0.9 mmol/L (ref 0.5–1.9)

## 2021-09-12 LAB — PROTIME-INR
INR: 1.2 (ref 0.8–1.2)
Prothrombin Time: 14.8 seconds (ref 11.4–15.2)

## 2021-09-12 MED ORDER — ACETAMINOPHEN 650 MG RE SUPP: 650.0000 mg | Freq: Four times a day (QID) | RECTAL | Status: DC | PRN

## 2021-09-12 MED ORDER — VITAMIN D (ERGOCALCIFEROL) 1.25 MG (50000 UNIT) PO CAPS
50000.0000 [IU] | ORAL_CAPSULE | ORAL | Status: DC
  Administered 2021-09-13: 50000 [IU] via ORAL
  Filled 2021-09-12: qty 1

## 2021-09-12 MED ORDER — SODIUM CHLORIDE 0.9 % IV SOLN
2.0000 g | INTRAVENOUS | Status: DC
  Administered 2021-09-13 – 2021-09-16 (×4): 2 g via INTRAVENOUS
  Filled 2021-09-12 (×5): qty 20

## 2021-09-12 MED ORDER — TRAZODONE HCL 50 MG PO TABS
25.0000 mg | ORAL_TABLET | Freq: Every evening | ORAL | Status: DC | PRN
  Administered 2021-09-12: 22:00:00 25 mg via ORAL
  Filled 2021-09-12: qty 1

## 2021-09-12 MED ORDER — POTASSIUM CHLORIDE 20 MEQ PO PACK
40.0000 meq | PACK | Freq: Once | ORAL | Status: AC
  Administered 2021-09-12: 06:00:00 40 meq via ORAL
  Filled 2021-09-12: qty 2

## 2021-09-12 MED ORDER — ENOXAPARIN SODIUM 40 MG/0.4ML IJ SOSY
40.0000 mg | PREFILLED_SYRINGE | Freq: Every day | INTRAMUSCULAR | Status: DC
  Administered 2021-09-12 – 2021-09-16 (×5): 40 mg via SUBCUTANEOUS
  Filled 2021-09-12 (×5): qty 0.4

## 2021-09-12 MED ORDER — ACETAMINOPHEN 325 MG PO TABS
650.0000 mg | ORAL_TABLET | Freq: Four times a day (QID) | ORAL | Status: DC | PRN
  Administered 2021-09-13: 650 mg via ORAL
  Filled 2021-09-12 (×2): qty 2

## 2021-09-12 MED ORDER — SODIUM CHLORIDE 0.9 % IV SOLN
INTRAVENOUS | Status: DC
Start: 2021-09-12 — End: 2021-09-15

## 2021-09-12 MED ORDER — ONDANSETRON HCL 4 MG/2ML IJ SOLN
4.0000 mg | Freq: Four times a day (QID) | INTRAMUSCULAR | Status: DC | PRN
  Administered 2021-09-12 – 2021-09-16 (×6): 4 mg via INTRAVENOUS
  Filled 2021-09-12 (×8): qty 2

## 2021-09-12 MED ORDER — MORPHINE SULFATE (PF) 2 MG/ML IV SOLN
2.0000 mg | INTRAVENOUS | Status: DC | PRN
  Administered 2021-09-12 – 2021-09-13 (×2): 2 mg via INTRAVENOUS
  Filled 2021-09-12 (×2): qty 1

## 2021-09-12 MED ORDER — FENTANYL CITRATE PF 50 MCG/ML IJ SOSY
50.0000 ug | PREFILLED_SYRINGE | Freq: Once | INTRAMUSCULAR | Status: AC
Start: 2021-09-12 — End: 2021-09-12
  Administered 2021-09-12: 03:00:00 50 ug via INTRAVENOUS
  Filled 2021-09-12: qty 1

## 2021-09-12 MED ORDER — IOHEXOL 300 MG/ML  SOLN
100.0000 mL | Freq: Once | INTRAMUSCULAR | Status: AC | PRN
  Administered 2021-09-12: 02:00:00 100 mL via INTRAVENOUS

## 2021-09-12 MED ORDER — SODIUM CHLORIDE 0.9 % IV SOLN
2.0000 g | Freq: Once | INTRAVENOUS | Status: AC
  Administered 2021-09-12: 03:00:00 2 g via INTRAVENOUS
  Filled 2021-09-12: qty 20

## 2021-09-12 MED ORDER — TOPIRAMATE 100 MG PO TABS
100.0000 mg | ORAL_TABLET | Freq: Every day | ORAL | Status: DC
  Administered 2021-09-13 – 2021-09-15 (×3): 100 mg via ORAL
  Filled 2021-09-12 (×4): qty 1

## 2021-09-12 MED ORDER — PHENTERMINE HCL 37.5 MG PO CAPS: 37.5000 mg | ORAL_CAPSULE | ORAL | Status: DC

## 2021-09-12 MED ORDER — MORPHINE SULFATE (PF) 2 MG/ML IV SOLN
2.0000 mg | Freq: Once | INTRAVENOUS | Status: AC
  Administered 2021-09-12: 12:00:00 2 mg via INTRAVENOUS
  Filled 2021-09-12: qty 1

## 2021-09-12 MED ORDER — KETOROLAC TROMETHAMINE 30 MG/ML IJ SOLN
30.0000 mg | Freq: Four times a day (QID) | INTRAMUSCULAR | Status: AC | PRN
  Administered 2021-09-12 – 2021-09-13 (×4): 30 mg via INTRAVENOUS
  Filled 2021-09-12 (×4): qty 1

## 2021-09-12 MED ORDER — ONDANSETRON HCL 4 MG PO TABS: 4.0000 mg | ORAL_TABLET | Freq: Four times a day (QID) | ORAL | Status: DC | PRN

## 2021-09-12 MED ORDER — SODIUM CHLORIDE 0.9 % IV BOLUS
1000.0000 mL | Freq: Once | INTRAVENOUS | Status: AC
  Administered 2021-09-12: 02:00:00 1000 mL via INTRAVENOUS

## 2021-09-12 MED ORDER — MAGNESIUM HYDROXIDE 400 MG/5ML PO SUSP
30.0000 mL | Freq: Every day | ORAL | Status: DC | PRN
  Administered 2021-09-15: 30 mL via ORAL
  Filled 2021-09-12 (×2): qty 30

## 2021-09-12 NOTE — Progress Notes (Signed)
Brief same day note: ? ?Patient is a 56 year old female with history of GERD, obesity who presented here with complaints of fatigue, generalized weakness, back pain, nausea, vomiting.  On presentation she was febrile, tachycardic, hypertensive.  Patient was also complaining of right flank pain.  CT imaging showed right-sided perinephric inflammatory fat stranding suspicious for acute pyelonephritis.  Patient was admitted for the management of pyelonephritis, started on ceftriaxone. ?Patient seen and examined at the bedside this morning.  She is hemodynamically stable during my evaluation.  Afebrile today.  She complains of low back pain.  We will continue current management.  We will also consult PT for persistent back pain. ?

## 2021-09-12 NOTE — H&P (Signed)
[place]   PATIENT NAME: Connie Mcneil    MR#:  [id number]  DATE OF BIRTH:  [birth year]  DATE OF ADMISSION:  09/11/2021  PRIMARY CARE PHYSICIAN: Courtney Paris, NP   Patient is coming from: Home  REQUESTING/REFERRING PHYSICIAN: Garlon Hatchet, PA-C   CHIEF COMPLAINT:   Chief Complaint  Patient presents with   Chills   Fever    HISTORY OF PRESENT ILLNESS:  Connie Mcneil is a 56 y.o. African-American female with medical history significant for GERD and obesity, who presented to the ER with acute onset of fatigue and generalized weakness since Monday and worsening back pain as well as associated nausea with vomiting once and 1 episode of loose stools.  Her back pain has been all across her back and felt as a dull aching pain.  Her urine has been dark in color.  She denies a urinary frequency or urgency or dysuria or hematuria but has been having right flank pain.  She admitted to recurrent fever and chills since Monday with a Tmax of 103.2.  She has been taking Advil for pain and fever.  No chest pain or dyspnea or cough or wheezing.  No nausea or vomiting here but vomited at home.  No headache or neck pain or stiffness.  ED Course: Upon presentation to the ER BP was 102/50 with a temperature of 102.7 and heart rate 110 with respiratory rate of 18 and pulse oximetry of 95% on room air.  Labs reveal mild hypokalemia of 3.2 and a CO2 of 18 with blood glucose of 149 otherwise unremarkable CMP.  Lactic acid was 1.3 and later 0.9.  CBC showed hemoglobin of 11.4 hematocrit 36 with microcytosis.  Quantitative hCG was 8.2  Imaging: Portable chest ray showed subsegmental atelectasis and/or scarring at the left lung base. Chest, abdominal pelvic CT scan revealed the following: 1. Right-sided perinephric inflammatory fat stranding, suspicious for sequelae associated with acute pyelonephritis. Correlation with urinalysis is recommended. 2. 4 mm solid, noncalcified right middle lobe lung  nodule. No follow-up needed if patient is low-risk.This recommendation follows the consensus statement: Guidelines for Management of Incidental Pulmonary Nodules Detected on CT Images: From the Fleischner Society 2017; Radiology 2017; 284:228-243. 3. Innumerable small bilateral renal cysts. 4. Bilateral 2 mm nonobstructing renal calculi. 5. Aortic atherosclerosis.  The patient was given 1 g of p.o. Tylenol, 2 g of IV Rocephin, 50 mcg of IV fentanyl, 30 mg of IV Toradol, 800 mg of p.o. ibuprofen and 4 mg of IV Zofran as well as 2 L bolus of IV normal saline.  She will be admitted to a medical telemetry bed for further evaluation and management.  PAST MEDICAL HISTORY:   Past Medical History:  Diagnosis Date   Ileitis    10/2013   Obesity     PAST SURGICAL HISTORY:   Past Surgical History:  Procedure Laterality Date   APPENDECTOMY     CHOLECYSTECTOMY     TOTAL ABDOMINAL HYSTERECTOMY      SOCIAL HISTORY:   Social History   Tobacco Use   Smoking status: Never   Smokeless tobacco: Never  Substance Use Topics   Alcohol use: No    FAMILY HISTORY:   Family History  Problem Relation Age of Onset   Diabetes Mother    Heart disease Mother        chf   Hypertension Mother     DRUG ALLERGIES:  No Known Allergies  REVIEW OF SYSTEMS:   ROS  As per history of present illness. All pertinent systems were reviewed above. Constitutional, HEENT, cardiovascular, respiratory, GI, GU, musculoskeletal, neuro, psychiatric, endocrine, integumentary and hematologic systems were reviewed and are otherwise negative/unremarkable except for positive findings mentioned above in the HPI.   MEDICATIONS AT HOME:   Prior to Admission medications   Medication Sig Start Date End Date Taking? Authorizing Provider  naproxen sodium (ALEVE) 220 MG tablet Take 220 mg by mouth daily as needed (pain).   Yes [provider]  phentermine 37.5 MG capsule Take 1 capsule (37.5 mg total)  by mouth every morning. 08/13/20  Yes Serena CroissantGudena, Vinay, MD  topiramate (TOPAMAX) 100 MG tablet Take 1 tablet (100 mg total) by mouth daily. 08/13/20  Yes Serena CroissantGudena, Vinay, MD  Vitamin D, Ergocalciferol, (DRISDOL) 1.25 MG (50000 UNIT) CAPS capsule Take 50,000 Units by mouth once a week. 08/02/21  Yes [provider]      VITAL SIGNS:  Blood pressure 100/67, pulse 72, temperature 98.6 F (37 C), resp. rate 19, SpO2 98 %.  PHYSICAL EXAMINATION:  Physical Exam  GENERAL:  56 y.o.-year-old African-American female patient lying in the bed with no acute distress.  EYES: Pupils equal, round, reactive to light and accommodation. No scleral icterus. Extraocular muscles intact.  HEENT: Head atraumatic, normocephalic. Oropharynx and nasopharynx clear.  NECK:  Supple, no jugular venous distention. No thyroid enlargement, no tenderness.  LUNGS: Normal breath sounds bilaterally, no wheezing, rales,rhonchi or crepitation. No use of accessory muscles of respiration.  CARDIOVASCULAR: Regular rate and rhythm, S1, S2 normal. No murmurs, rubs, or gallops.  ABDOMEN: Soft, nondistended, nontender. Bowel sounds present. No organomegaly or mass.  EXTREMITIES: No pedal edema, cyanosis, or clubbing.  She has right CVA tenderness. NEUROLOGIC: Cranial nerves II through XII are intact. Muscle strength 5/5 in all extremities. Sensation intact. Gait not checked.  PSYCHIATRIC: The patient is alert and oriented x 3.  Normal affect and good eye contact. SKIN: No obvious rash, lesion, or ulcer.   LABORATORY PANEL:   CBC Recent Labs  Lab 09/11/21 2207  WBC 7.0  HGB 11.4*  HCT 36.0  PLT 336   ------------------------------------------------------------------------------------------------------------------  Chemistries  Recent Labs  Lab 09/11/21 2207  NA 136  K 3.2*  CL 109  CO2 18*  GLUCOSE 149*  BUN 11  CREATININE 0.89  CALCIUM 9.0  AST 20  ALT 16  ALKPHOS 74  BILITOT 0.9    ------------------------------------------------------------------------------------------------------------------  Cardiac Enzymes No results for input(s): TROPONINI in the last 168 hours. ------------------------------------------------------------------------------------------------------------------  RADIOLOGY:  DG Chest 2 View  Result Date: 09/11/2021 CLINICAL DATA:  Shortness of breath with fever and chills. EXAM: CHEST - 2 VIEW COMPARISON:  08/23/2014 FINDINGS: Upper normal heart size. Subsegmental atelectasis and or scarring at the left lung base. No confluent consolidation, pleural effusion, or pneumothorax. No pulmonary edema. No acute osseous findings. IMPRESSION: Subsegmental atelectasis and or scarring at the left lung base. Electronically Signed   By: Narda RutherfordMelanie  Sanford M.D.   On: 09/11/2021 22:50   CT CHEST ABDOMEN PELVIS W CONTRAST  Result Date: 09/12/2021 CLINICAL DATA:  Fever, chills and body aches. EXAM: CT CHEST, ABDOMEN, AND PELVIS WITH CONTRAST TECHNIQUE: Multidetector CT imaging of the chest, abdomen and pelvis was performed following the standard protocol during bolus administration of intravenous contrast. RADIATION DOSE REDUCTION: This exam was performed according to the departmental dose-optimization program which includes automated exposure control, adjustment of the mA and/or kV according to patient size and/or use of iterative reconstruction technique. CONTRAST:  100mL OMNIPAQUE  IOHEXOL 300 MG/ML  SOLN COMPARISON:  June 28, 2018 FINDINGS: CT CHEST FINDINGS Cardiovascular: There is mild calcification of the aortic arch, without evidence of aortic aneurysm or dissection. Normal heart size. No pericardial effusion. Mediastinum/Nodes: There is mild AP window lymphadenopathy. Thyroid gland, trachea, and esophagus demonstrate no significant findings. Lungs/Pleura: Mild linear atelectasis is seen within the right middle lobe and left lower lobe. Left lower lobe bronchiectasis  is also noted. A 4 mm solid, noncalcified lung nodule is seen along the periphery of the right middle lobe (axial CT image 77, CT series 5). There is no evidence of acute infiltrate, pleural effusion or pneumothorax. Musculoskeletal: No chest wall mass or suspicious bone lesions identified. CT ABDOMEN PELVIS FINDINGS Hepatobiliary: 7 mm and 8 mm foci of parenchymal low attenuation are seen within the anteromedial aspect of the right lobe of the liver. These are too small to characterize by CT examination. Status post cholecystectomy. The common bile duct measures approximately 9 mm caliber. Pancreas: Unremarkable. No pancreatic ductal dilatation or surrounding inflammatory changes. Spleen: Normal in size without focal abnormality. Adrenals/Urinary Tract: Adrenal glands are unremarkable. Kidneys are normal in size, without obstructing renal calculi or hydronephrosis. Bilateral 2 mm nonobstructing renal calculi are noted. Innumerable small bilateral renal cysts are seen. There is mild to moderate severity right-sided perinephric inflammatory fat stranding. Bladder is unremarkable. Stomach/Bowel: Stomach is within normal limits. The appendix is surgically absent. No evidence of bowel wall thickening, distention, or inflammatory changes. Vascular/Lymphatic: No significant vascular findings are present. No enlarged abdominal or pelvic lymph nodes. Reproductive: Status post hysterectomy. No adnexal masses. Other: No abdominal wall hernia or abnormality. No abdominopelvic ascites. Musculoskeletal: No acute or significant osseous findings. IMPRESSION: 1. Right-sided perinephric inflammatory fat stranding, suspicious for sequelae associated with acute pyelonephritis. Correlation with urinalysis is recommended. 2. 4 mm solid, noncalcified right middle lobe lung nodule. No follow-up needed if patient is low-risk.This recommendation follows the consensus statement: Guidelines for Management of Incidental Pulmonary Nodules  Detected on CT Images: From the Fleischner Society 2017; Radiology 2017; 284:228-243. 3. Innumerable small bilateral renal cysts. 4. Bilateral 2 mm nonobstructing renal calculi. 5. Aortic atherosclerosis. Aortic Atherosclerosis (ICD10-I70.0). Electronically Signed   By: Aram Candela M.D.   On: 09/12/2021 02:40      IMPRESSION AND PLAN:  Assessment and Plan: * Pyelonephritis - The patient will be admitted to a medical telemetry bed. - We will continue antibiotic therapy with IV Rocephin. - We will follow urine and blood cultures. - Pain management to be provided. - Should be hydrated with IV normal saline.  Sepsis due to gram-negative UTI (HCC) - This is manifested by fever and tachycardia - Management as above. - The patient will be can IV Rocephin. - She will be hydrated with IV normal saline and will follow urine and blood cultures.  Severe sepsis (HCC) - This is manifested by associated hypotension that is responding to IV fluids. - We will continue hydration with IV normal saline and follow blood and urine cultures.  Hypokalemia We will replace potassium and check magnesium level.  Vitamin D deficiency - Her vitamin D will be continued when due.       DVT prophylaxis: Lovenox. Advanced Care Planning:  Code Status: full code. Family Communication:  The plan of care was discussed in details with the patient (and family). I answered all questions. The patient agreed to proceed with the above mentioned plan. Further management will depend upon hospital course. Disposition Plan: Back to previous home environment  Consults called: none. All the records are reviewed and case discussed with ED provider.  Status is: Inpatient  At the time of the admission, it appears that the appropriate admission status for this patient is inpatient.  This is judged to be reasonable and necessary in order to provide the required intensity of service to ensure the patient's safety given the  presenting symptoms, physical exam findings and initial radiographic and laboratory data in the context of comorbid conditions.  The patient requires inpatient status due to high intensity of service, high risk of further deterioration and high frequency of surveillance required.  I certify that at the time of admission, it is my clinical judgment that the patient will require inpatient hospital care extending more than 2 midnights.                            Dispo: The patient is from: Home              Anticipated d/c is to: Home              Patient currently is not medically stable to d/c.              Difficult to place patient: No  Hannah Beat M.D on 09/12/2021 at 5:23 AM  [facility]   From 7 PM-7 AM, contact night-coverage [email or web address]  CC: Primary care physician; Courtney Paris, NP

## 2021-09-12 NOTE — Assessment & Plan Note (Addendum)
-   This is manifested by fever and tachycardia ?- Management as above. ?- The patient will be can IV Rocephin. ?- She will be hydrated with IV normal saline and will follow urine and blood cultures. ?

## 2021-09-12 NOTE — Assessment & Plan Note (Addendum)
Supplemented and corrected °

## 2021-09-12 NOTE — Assessment & Plan Note (Addendum)
Continue supplementation  ?

## 2021-09-12 NOTE — Assessment & Plan Note (Addendum)
Presented with right flank pain.  CT imagings consistent  of right-sided pyelonephritis.  Currently on ceftriaxone.  Blood cultures, no growth till date.  Urine culture showed multiple species.  Afebrile today.  Complains of intermittent chills. ?

## 2021-09-12 NOTE — Assessment & Plan Note (Addendum)
Continue current antibiotics, gentle IV fluids.Blood pressure remains soft.  Afebrile this morning ?

## 2021-09-12 NOTE — ED Notes (Signed)
Patient c/o severe bilateral low back pain, offered PRN tylenol. Patient refused at this time stating she has high tolerance and it won't work. Notified admitting via secure chat.  ?

## 2021-09-13 ENCOUNTER — Inpatient Hospital Stay (HOSPITAL_COMMUNITY): Payer: BC Managed Care – PPO

## 2021-09-13 DIAGNOSIS — M545 Low back pain, unspecified: Secondary | ICD-10-CM

## 2021-09-13 LAB — CBC WITH DIFFERENTIAL/PLATELET
Abs Immature Granulocytes: 0.02 10*3/uL (ref 0.00–0.07)
Basophils Absolute: 0 10*3/uL (ref 0.0–0.1)
Basophils Relative: 0 %
Eosinophils Absolute: 0 10*3/uL (ref 0.0–0.5)
Eosinophils Relative: 0 %
HCT: 32.1 % — ABNORMAL LOW (ref 36.0–46.0)
Hemoglobin: 10.1 g/dL — ABNORMAL LOW (ref 12.0–15.0)
Immature Granulocytes: 0 %
Lymphocytes Relative: 23 %
Lymphs Abs: 1.1 10*3/uL (ref 0.7–4.0)
MCH: 24.8 pg — ABNORMAL LOW (ref 26.0–34.0)
MCHC: 31.5 g/dL (ref 30.0–36.0)
MCV: 78.9 fL — ABNORMAL LOW (ref 80.0–100.0)
Monocytes Absolute: 0.3 10*3/uL (ref 0.1–1.0)
Monocytes Relative: 7 %
Neutro Abs: 3.2 10*3/uL (ref 1.7–7.7)
Neutrophils Relative %: 70 %
Platelets: 265 10*3/uL (ref 150–400)
RBC: 4.07 MIL/uL (ref 3.87–5.11)
RDW: 15.4 % (ref 11.5–15.5)
WBC: 4.6 10*3/uL (ref 4.0–10.5)
nRBC: 0 % (ref 0.0–0.2)

## 2021-09-13 LAB — BASIC METABOLIC PANEL
Anion gap: 9 (ref 5–15)
BUN: 8 mg/dL (ref 6–20)
CO2: 17 mmol/L — ABNORMAL LOW (ref 22–32)
Calcium: 7.9 mg/dL — ABNORMAL LOW (ref 8.9–10.3)
Chloride: 111 mmol/L (ref 98–111)
Creatinine, Ser: 0.92 mg/dL (ref 0.44–1.00)
GFR, Estimated: >60 mL/min (ref >60)
Glucose, Bld: 105 mg/dL — ABNORMAL HIGH (ref 70–99)
Potassium: 3.4 mmol/L — ABNORMAL LOW (ref 3.5–5.1)
Sodium: 137 mmol/L (ref 135–145)

## 2021-09-13 LAB — URINE CULTURE

## 2021-09-13 MED ORDER — METHOCARBAMOL 750 MG PO TABS
750.0000 mg | ORAL_TABLET | Freq: Four times a day (QID) | ORAL | Status: DC | PRN
  Administered 2021-09-16: 750 mg via ORAL
  Filled 2021-09-13: qty 1

## 2021-09-13 MED ORDER — KETOROLAC TROMETHAMINE 30 MG/ML IJ SOLN
30.0000 mg | Freq: Four times a day (QID) | INTRAMUSCULAR | Status: AC | PRN
  Administered 2021-09-13 – 2021-09-14 (×4): 30 mg via INTRAVENOUS
  Filled 2021-09-13 (×4): qty 1

## 2021-09-13 MED ORDER — POTASSIUM CHLORIDE CRYS ER 20 MEQ PO TBCR
40.0000 meq | EXTENDED_RELEASE_TABLET | Freq: Once | ORAL | Status: AC
  Administered 2021-09-13: 40 meq via ORAL
  Filled 2021-09-13: qty 2

## 2021-09-13 MED ORDER — HYDROMORPHONE HCL 1 MG/ML IJ SOLN
0.5000 mg | INTRAMUSCULAR | Status: DC | PRN
  Administered 2021-09-14 – 2021-09-16 (×5): 0.5 mg via INTRAVENOUS
  Filled 2021-09-13 (×6): qty 0.5

## 2021-09-13 NOTE — Progress Notes (Addendum)
PROGRESS NOTE  Connie AmyShebra Kauk  ZOX:[id number] DOB: 07/20/1965 DOA: 09/11/2021 PCP: Courtney ParisMcCoy, Rachel, NP   Brief Narrative: Patient is a 56 year old female with history of GERD, obesity who presented here with complaints of fatigue, generalized weakness, back pain, nausea, vomiting.  On presentation she was febrile, tachycardic, hypertensive.  Patient was also complaining of right flank pain.  CT imaging showed right-sided perinephric inflammatory fat stranding suspicious for acute pyelonephritis.  Patient was admitted for the management of pyelonephritis, started on ceftriaxone. She complains of low back pain. Consulted PT for persistent back pain.  Assessment & Plan:  Principal Problem:   Pyelonephritis Active Problems:   Severe sepsis (HCC)   Hypokalemia   Vitamin D deficiency   Low back pain   Assessment and Plan: * Pyelonephritis Presented with right flank pain.  CT imagings consistent  of right-sided pyelonephritis.  Currently on ceftriaxone.  Follow-up urine culture, blood culture.  Afebrile today.  Complains of intermittent chills.  Sepsis due to gram-negative UTI (HCC)-resolved as of 09/13/2021 - This is manifested by fever and tachycardia - Management as above. - The patient will be can IV Rocephin. - She will be hydrated with IV normal saline and will follow urine and blood cultures.  Severe sepsis (HCC) Sepsis physiology improving.  Continue current antibiotics, gentle IV fluids  Hypokalemia We will replace potassium  Vitamin D deficiency Continue supplementation  Low back pain Unclear etiology.  Complains of persistent low back pain.  Lumbar x-ray did not show any acute findings.  Continue muscle relaxants, pain medications and supportive care.  PT consulted              DVT prophylaxis:enoxaparin (LOVENOX) injection 40 mg Start: 09/12/21 1000     Code Status: Full Code  Family Communication: None at bedside  Patient status:Inpatient  Patient is from  :Home  Anticipated discharge WJ:XBJYto:Home  Estimated DC date:tomorrow   Consultants: None  Procedures:None  Antimicrobials:  Anti-infectives (From admission, onward)    Start     Dose/Rate Route Frequency Ordered Stop   09/13/21 0800  cefTRIAXone (ROCEPHIN) 2 g in sodium chloride 0.9 % 100 mL IVPB        2 g 200 mL/hr over 30 Minutes Intravenous Every 24 hours 09/12/21 0431 09/20/21 0759   09/12/21 0300  cefTRIAXone (ROCEPHIN) 2 g in sodium chloride 0.9 % 100 mL IVPB        2 g 200 mL/hr over 30 Minutes Intravenous  Once 09/12/21 0257 09/12/21 0449       Subjective:  Patient seen and examined at the bedside this morning.  Hemodynamically stable today.  Afebrile but complains of intermittent chills/shivering.  Complains of low back pain   Objective: Vitals:   09/12/21 1322 09/12/21 2129 09/13/21 0457 09/13/21 0843  BP: (!) 104/58 (!) 111/49 (!) 98/54 (!) 111/52  Pulse: 86 75 81 74  Resp: 18 17 17 17   Temp: 99.8 F (37.7 C) 98.7 F (37.1 C) 98.7 F (37.1 C) 98.8 F (37.1 C)  TempSrc:  Oral Oral Oral  SpO2: 97% 99% 97% 96%   No intake or output data in the 24 hours ending 09/13/21 1359 There were no vitals filed for this visit.  Examination:  General exam: Overall comfortable, not in distress,obese HEENT: PERRL Respiratory system:  no wheezes or crackles  Cardiovascular system: S1 & S2 heard, RRR.  Gastrointestinal system: Abdomen is nondistended, soft and nontender. Central nervous system: Alert and oriented Extremities: No edema, no clubbing ,no cyanosis Skin: No rashes,  no ulcers,no icterus     Data Reviewed: I have personally reviewed following labs and imaging studies  CBC: Recent Labs  Lab 09/11/21 2207 09/12/21 0644 09/13/21 0205  WBC 7.0 5.1 4.6  NEUTROABS 6.1  --  3.2  HGB 11.4* 10.5* 10.1*  HCT 36.0 33.0* 32.1*  MCV 77.4* 79.3* 78.9*  PLT 336 300 265   Basic Metabolic Panel: Recent Labs  Lab 09/11/21 2207 09/12/21 0644 09/13/21 0205   NA 136 139 137  K 3.2* 3.9 3.4*  CL 109 114* 111  CO2 18* 17* 17*  GLUCOSE 149* 118* 105*  BUN CREATININE 0.89 0.84 0.92  CALCIUM 9.0 8.0* 7.9*  MG  --  1.9  --      Recent Results (from the past 240 hour(s))  Urine Culture     Status: Abnormal   Collection Time: 09/11/21  8:25 AM   Specimen: Urine, Clean Catch  Result Value Ref Range Status   Specimen Description URINE, CLEAN CATCH  Final   Special Requests   Final    NONE Performed at [facility], [address], [place], [place] [address]    Culture MULTIPLE SPECIES PRESENT, SUGGEST RECOLLECTION (A)  Final   Report Status 09/13/2021 FINAL  Final  Resp Panel by RT-PCR (Flu A&B, Covid) Nasopharyngeal Swab     Status: None   Collection Time: 09/11/21  9:52 PM   Specimen: Nasopharyngeal Swab; Nasopharyngeal(NP) swabs in vial transport medium  Result Value Ref Range Status   SARS Coronavirus 2 by RT PCR NEGATIVE NEGATIVE Final    Comment: (NOTE) SARS-CoV-2 target nucleic acids are NOT DETECTED.  The SARS-CoV-2 RNA is generally detectable in upper respiratory specimens during the acute phase of infection. The lowest concentration of SARS-CoV-2 viral copies this assay can detect is 138 copies/mL. A negative result does not preclude SARS-Cov-2 infection and should not be used as the sole basis for treatment or other patient management decisions. A negative result may occur with  improper specimen collection/handling, submission of specimen other than nasopharyngeal swab, presence of viral mutation(s) within the areas targeted by this assay, and inadequate number of viral copies(<138 copies/mL). A negative result must be combined with clinical observations, patient history, and epidemiological information. The expected result is Negative.  Fact Sheet for Patients:  BloggerCourse.com  Fact Sheet for Healthcare Providers:  SeriousBroker.it  This test is no t  yet approved or cleared by the [place] FDA and  has been authorized for detection and/or diagnosis of SARS-CoV-2 by FDA under an Emergency Use Authorization (EUA). This EUA will remain  in effect (meaning this test can be used) for the duration of the COVID-19 declaration under Section 564(b)(1) of the Act, 21 U.S.C.section 360bbb-3(b)(1), unless the authorization is terminated  or revoked sooner.       Influenza A by PCR NEGATIVE NEGATIVE Final   Influenza B by PCR NEGATIVE NEGATIVE Final    Comment: (NOTE) The Xpert Xpress SARS-CoV-2/FLU/RSV plus assay is intended as an aid in the diagnosis of influenza from Nasopharyngeal swab specimens and should not be used as a sole basis for treatment. Nasal washings and aspirates are unacceptable for Xpert Xpress SARS-CoV-2/FLU/RSV testing.  Fact Sheet for Patients: BloggerCourse.com  Fact Sheet for Healthcare Providers: SeriousBroker.it  This test is not yet approved or cleared by the [place] FDA and has been authorized for detection and/or diagnosis of SARS-CoV-2 by FDA under an Emergency Use Authorization (EUA). This EUA will remain in  effect (meaning this test can be used) for the duration of the COVID-19 declaration under Section 564(b)(1) of the Act, 21 U.S.C. section 360bbb-3(b)(1), unless the authorization is terminated or revoked.  Performed at [facility], [address] [place], [place] [address]   Culture, blood (Routine x 2)     Status: None (Preliminary result)   Collection Time: 09/11/21 10:06 PM   Specimen: BLOOD  Result Value Ref Range Status   Specimen Description BLOOD RIGHT ANTECUBITAL  Final   Special Requests AEROBIC BOTTLE ONLY Blood Culture adequate volume  Final   Culture   Final    NO GROWTH < 24 HOURS Performed at [facility], [address], [place], [place] [address]    Report Status PENDING  Incomplete  Culture, blood  (Routine x 2)     Status: None (Preliminary result)   Collection Time: 09/11/21 10:07 PM   Specimen: BLOOD LEFT ARM  Result Value Ref Range Status   Specimen Description BLOOD LEFT ARM  Final   Special Requests   Final    BOTTLES DRAWN AEROBIC AND ANAEROBIC Blood Culture adequate volume   Culture   Final    NO GROWTH < 24 HOURS Performed at [facility], [address] [place], [place] [address]    Report Status PENDING  Incomplete     Radiology Studies: DG Chest 2 View  Result Date: 09/11/2021 CLINICAL DATA:  Shortness of breath with fever and chills. EXAM: CHEST - 2 VIEW COMPARISON:  08/23/2014 FINDINGS: Upper normal heart size. Subsegmental atelectasis and or scarring at the left lung base. No confluent consolidation, pleural effusion, or pneumothorax. No pulmonary edema. No acute osseous findings. IMPRESSION: Subsegmental atelectasis and or scarring at the left lung base. Electronically Signed   By: Narda Rutherford M.D.   On: 09/11/2021 22:50   DG Lumbar Spine 2-3 Views  Result Date: 09/13/2021 CLINICAL DATA:  Chills, fever, back pain EXAM: LUMBAR SPINE - 2-3 VIEW COMPARISON:  08/25/2013; correlation CT chest abdomen pelvis 09/12/2021 FINDINGS: Incomplete Lumbarization of S1. Vertebral body and disc space heights maintained. Question mild osseous demineralization. Vertebral body heights maintained without fracture or subluxation. No bone destruction or gross spondylolysis. SI joints preserved. LEFT paraspinal calcifications at L4-L5 increased from previous exam; recent CT demonstrates these are gonadal vein phleboliths. Numerous pelvic phleboliths. Surgical clips RIGHT upper quadrant question cholecystectomy. IMPRESSION: No acute lumbar spine abnormalities. Electronically Signed   By: Ulyses Southward M.D.   On: 09/13/2021 12:43   CT CHEST ABDOMEN PELVIS W CONTRAST  Result Date: 09/12/2021 CLINICAL DATA:  Fever, chills and body aches. EXAM: CT CHEST, ABDOMEN, AND PELVIS WITH CONTRAST  TECHNIQUE: Multidetector CT imaging of the chest, abdomen and pelvis was performed following the standard protocol during bolus administration of intravenous contrast. RADIATION DOSE REDUCTION: This exam was performed according to the departmental dose-optimization program which includes automated exposure control, adjustment of the mA and/or kV according to patient size and/or use of iterative reconstruction technique. CONTRAST:  OMNIPAQUE IOHEXOL 300 MG/ML  SOLN COMPARISON:  June 28, 2018 FINDINGS: CT CHEST FINDINGS Cardiovascular: There is mild calcification of the aortic arch, without evidence of aortic aneurysm or dissection. Normal heart size. No pericardial effusion. Mediastinum/Nodes: There is mild AP window lymphadenopathy. Thyroid gland, trachea, and esophagus demonstrate no significant findings. Lungs/Pleura: Mild linear atelectasis is seen within the right middle lobe and left lower lobe. Left lower lobe bronchiectasis is also noted. A 4 mm solid, noncalcified lung nodule  is seen along the periphery of the right middle lobe (axial CT image 77, CT series 5). There is no evidence of acute infiltrate, pleural effusion or pneumothorax. Musculoskeletal: No chest wall mass or suspicious bone lesions identified. CT ABDOMEN PELVIS FINDINGS Hepatobiliary: 7 mm and 8 mm foci of parenchymal low attenuation are seen within the anteromedial aspect of the right lobe of the liver. These are too small to characterize by CT examination. Status post cholecystectomy. The common bile duct measures approximately 9 mm caliber. Pancreas: Unremarkable. No pancreatic ductal dilatation or surrounding inflammatory changes. Spleen: Normal in size without focal abnormality. Adrenals/Urinary Tract: Adrenal glands are unremarkable. Kidneys are normal in size, without obstructing renal calculi or hydronephrosis. Bilateral 2 mm nonobstructing renal calculi are noted. Innumerable small bilateral renal cysts are seen. There is  mild to moderate severity right-sided perinephric inflammatory fat stranding. Bladder is unremarkable. Stomach/Bowel: Stomach is within normal limits. The appendix is surgically absent. No evidence of bowel wall thickening, distention, or inflammatory changes. Vascular/Lymphatic: No significant vascular findings are present. No enlarged abdominal or pelvic lymph nodes. Reproductive: Status post hysterectomy. No adnexal masses. Other: No abdominal wall hernia or abnormality. No abdominopelvic ascites. Musculoskeletal: No acute or significant osseous findings. IMPRESSION: 1. Right-sided perinephric inflammatory fat stranding, suspicious for sequelae associated with acute pyelonephritis. Correlation with urinalysis is recommended. 2. 4 mm solid, noncalcified right middle lobe lung nodule. No follow-up needed if patient is low-risk.This recommendation follows the consensus statement: Guidelines for Management of Incidental Pulmonary Nodules Detected on CT Images: From the Fleischner Society 2017; Radiology 2017; 284:228-243. 3. Innumerable small bilateral renal cysts. 4. Bilateral 2 mm nonobstructing renal calculi. 5. Aortic atherosclerosis. Aortic Atherosclerosis (ICD10-I70.0). Electronically Signed   By: Aram Candela M.D.   On: 09/12/2021 02:40    Scheduled Meds:  enoxaparin (LOVENOX) injection  40 mg Subcutaneous Daily   topiramate  100 mg Oral Daily   Vitamin D (Ergocalciferol)  50,000 Units Oral Q Fri   Continuous Infusions:  sodium chloride 125 mL/hr at 09/13/21 1046   cefTRIAXone (ROCEPHIN)  IV 2 g (09/13/21 0844)     LOS: 1 day   Burnadette Pop, MD [facility] P3/04/2022, 1:59 PM

## 2021-09-13 NOTE — Evaluation (Signed)
Physical Therapy Evaluation ?Patient Details ?Name: Connie Mcneil ?MRN: [id number] ?DOB: 18-Feb-1966 ?Today's Date: 09/13/2021 ? ?History of Present Illness ? 56 y/o female presented to ED on 09/11/21 for back pain, N/V, and diarrhea. CT showed R sided perinephric inflammatory fat stranding suspicious for acute pyelonephritis. Dx with sepsis, low K+, LBP and tachycardia.  PMHx:GERD  ?Clinical Impression ? Pt was seen for progressing gait on RW and noted her struggle with both pain and endurance.  Pt is demonstrating control of O2 sats but note her pain in back and feelings of shivering and feeling unwell hinder her abiltiy to stand and remain up safely.  Follow for acute goals of getting home, and note her entrance to home is level along with potentially being able to stay on first level of home with family support.  Focus on standing and being up OOB in chair in her room.     ?   ? ?Recommendations for follow up therapy are one component of a multi-disciplinary discharge planning process, led by the attending physician.  Recommendations may be updated based on patient status, additional functional criteria and insurance authorization. ? ?Follow Up Recommendations Home health PT ? ?  ?Assistance Recommended at Discharge Intermittent Supervision/Assistance  ?Patient can return home with the following ? A little help with bathing/dressing/bathroom;A lot of help with walking and/or transfers;Assistance with cooking/housework;Assist for transportation;Help with stairs or ramp for entrance ? ?  ?Equipment Recommendations Rolling walker (2 wheels);Wheelchair (measurements PT);Wheelchair cushion (measurements PT)  ?Recommendations for Other Services ?    ?  ?Functional Status Assessment Patient has had a recent decline in their functional status and demonstrates the ability to make significant improvements in function in a reasonable and predictable amount of time.  ? ?  ?Precautions / Restrictions Precautions ?Precautions:  Fall ?Precaution Comments: monitor for BP and sats, HR ?Restrictions ?Weight Bearing Restrictions: No  ? ?  ? ?Mobility ? Bed Mobility ?Overal bed mobility: Needs Assistance ?Bed Mobility: Supine to Sit, Sit to Supine ?  ?  ?Supine to sit: Min assist ?Sit to supine: Mod assist ?  ?General bed mobility comments: min assist to sit up to support trunk ?  ? ?Transfers ?Overall transfer level: Needs assistance ?Equipment used: Rolling walker (2 wheels), 1 person hand held assist ?Transfers: Sit to/from Stand ?Sit to Stand: Mod assist ?  ?  ?  ?  ?  ?General transfer comment: mod assist to support power up ?  ? ?Ambulation/Gait ?Ambulation/Gait assistance: Min assist ?Gait Distance (Feet): 8 Feet ?Assistive device: Rolling walker (2 wheels), 1 person hand held assist ?Gait Pattern/deviations: Step-to pattern, Decreased stride length ?Gait velocity: reduced ?Gait velocity interpretation: <1.31 ft/sec, indicative of household ambulator ?  ?General Gait Details: sidesteps on side of bed with knees weak in appearance ? ?Stairs ?  ?  ?  ?  ?  ? ?Wheelchair Mobility ?  ? ?Modified Rankin (Stroke Patients Only) ?  ? ?  ? ?Balance Overall balance assessment: Needs assistance ?Sitting-balance support: Feet supported ?Sitting balance-Leahy Scale: Fair ?  ?  ?Standing balance support: Bilateral upper extremity supported, During functional activity ?Standing balance-Leahy Scale: Poor ?  ?  ?  ?  ?  ?  ?  ?  ?  ?  ?  ?  ?   ? ? ? ?Pertinent Vitals/Pain Pain Assessment ?Pain Assessment: Faces ?Faces Pain Scale: Hurts even more ?Pain Location: back and general pain ?Pain Descriptors / Indicators: Aching, Grimacing, Guarding ?Pain Intervention(s): Monitored during session, Patient  requesting pain meds-RN notified, Repositioned  ? ? ?Home Living Family/patient expects to be discharged to:: Private residence ?Living Arrangements: Children;Other relatives ?Available Help at Discharge: Family ?Type of Home: House ?Home Access: Level entry ?   ?  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  ?Prior Function Prior Level of Function : Independent/Modified Independent;Working/employed;Driving ?  ?  ?  ?  ?  ?  ?Mobility Comments: no AD needed ?  ?  ? ? ?Hand Dominance  ? Dominant Hand: Right ? ?  ?Extremity/Trunk Assessment  ? Upper Extremity Assessment ?Upper Extremity Assessment: Overall WFL for tasks assessed ?  ? ?Lower Extremity Assessment ?Lower Extremity Assessment: Generalized weakness ?  ? ?Cervical / Trunk Assessment ?Cervical / Trunk Assessment: Normal (but painful in back with her pyelonephritis)  ?Communication  ? Communication: No difficulties  ?Cognition Arousal/Alertness: Awake/alert ?Behavior During Therapy: [facility] for tasks assessed/performed ?Overall Cognitive Status: Within Functional Limits for tasks assessed ?  ?  ?  ?  ?  ?  ?  ?  ?  ?  ?  ?  ?  ?  ?  ?  ?General Comments: pt is unhappy about the decline and reporting this is her first serious illness ?  ?  ? ?  ?General Comments General comments (skin integrity, edema, etc.): pt was able to stand with help and sidestep but is generally weak and has low tolerance for extended standign with back pain hindering her efforts ? ?  ?Exercises    ? ?Assessment/Plan  ?  ?PT Assessment Patient needs continued PT services  ?PT Problem List Decreased strength;Decreased activity tolerance;Decreased balance;Decreased mobility;Decreased coordination;Decreased knowledge of use of DME;Cardiopulmonary status limiting activity;Pain ? ?   ?  ?PT Treatment Interventions DME instruction;Gait training;Functional mobility training;Stair training;Therapeutic activities;Therapeutic exercise;Balance training;Neuromuscular re-education;Patient/family education   ? ?PT Goals (Current goals can be found in the Care Plan section)  ?Acute Rehab PT Goals ?Patient Stated Goal: to get  home and pain to be managed ?PT Goal Formulation: With patient ?Time For Goal Achievement: 09/27/21 ?Potential to Achieve Goals: Good ? ?  ?Frequency Min 3X/week ?  ? ? ?Co-evaluation   ?  ?  ?  ?  ? ? ?  ?AM-PAC PT "6 Clicks" Mobility  ?Outcome Measure Help needed turning from your back to your side while in a flat bed without using bedrails?: A Little ?Help needed moving from lying on your back to sitting on the side of a flat bed without using bedrails?: A Lot ?Help needed moving to and from a bed to a chair (including a wheelchair)?: A Lot ?Help needed standing up from a chair using your arms (e.g., wheelchair or bedside chair)?: A Lot ?Help needed to walk in hospital room?: A Little ?Help needed climbing 3-5 steps with a railing? : Total ?6 Click Score: 13 ? ?  ?End of Session Equipment Utilized During Treatment: Gait belt ?Activity Tolerance: Patient limited by pain;Treatment limited secondary to medical complications (Comment) ?Patient left: in bed;with call bell/phone within reach;with bed alarm set ?Nurse Communication: Mobility status ?PT Visit Diagnosis: Unsteadiness on feet (R26.81);Muscle weakness (generalized) (M62.81);Pain ?Pain - Right/Left:  (back) ?Pain - part of body:  (back) ?  ? ?Time: [id number] ?PT Time Calculation (min) (ACUTE ONLY): 28 min ? ? ?Charges:   PT Evaluation ?$PT Eval Moderate Complexity: 1 Mod ?PT Treatments ?$Gait Training: 8-22  mins ?  ?   ? ?Ramond Dial ?09/13/2021, 8:30 PM ? ?

## 2021-09-13 NOTE — Assessment & Plan Note (Addendum)
Unclear etiology.  Complains of persistent low back pain.  Lumbar x-ray did not show any acute findings.  Continue muscle relaxants, pain medications and supportive care.  PT consulted, recommended home health ?

## 2021-09-13 NOTE — Progress Notes (Signed)
?   09/13/21 2114  ?Assess: MEWS Score  ?Temp (!) 102.9 ?F (39.4 ?C)  ?BP 125/70  ?Pulse Rate 83  ?Resp 19  ?SpO2 98 %  ?O2 Device Room Air  ?Assess: MEWS Score  ?MEWS Temp 2  ?MEWS Systolic 0  ?MEWS Pulse 0  ?MEWS RR 0  ?MEWS LOC 0  ?MEWS Score 2  ?MEWS Score Color Yellow  ?Assess: if the MEWS score is Yellow or Red  ?Were vital signs taken at a resting state? Yes  ?Focused Assessment Change from prior assessment (see assessment flowsheet)  ?Early Detection of Sepsis Score *See Row Information* Low  ?MEWS guidelines implemented *See Row Information* Yes  ?Treat  ?MEWS Interventions Administered prn meds/treatments  ?Take Vital Signs  ?Increase Vital Sign Frequency  Yellow: Q 2hr X 2 then Q 4hr X 2, if remains yellow, continue Q 4hrs  ?Escalate  ?MEWS: Escalate Yellow: discuss with charge nurse/RN and consider discussing with provider and RRT  ?Notify: Charge Nurse/RN  ?Name of Charge Nurse/RN Notified [place]  ?Date Charge Nurse/RN Notified 09/13/21  ?Time Charge Nurse/RN Notified 2200  ?Notify: Provider  ?Provider Name/Title X.Blount,NP  ?Date Provider Notified 09/13/21  ?Time Provider Notified 2239  ?Notification Type Page  ?Notification Reason Other (Comment) ?(temp 102.9)  ?Provider response No new orders  ?Document  ?Patient Outcome Other (Comment) ?(tylenol given)  ?Progress note created (see row info) Yes  ? ? ?

## 2021-09-14 LAB — CBC WITH DIFFERENTIAL/PLATELET
Abs Immature Granulocytes: 0.02 10*3/uL (ref 0.00–0.07)
Basophils Absolute: 0 10*3/uL (ref 0.0–0.1)
Basophils Relative: 1 %
Eosinophils Absolute: 0.1 10*3/uL (ref 0.0–0.5)
Eosinophils Relative: 1 %
HCT: 29.1 % — ABNORMAL LOW (ref 36.0–46.0)
Hemoglobin: 9.2 g/dL — ABNORMAL LOW (ref 12.0–15.0)
Immature Granulocytes: 0 %
Lymphocytes Relative: 26 %
Lymphs Abs: 1.3 10*3/uL (ref 0.7–4.0)
MCH: 24.4 pg — ABNORMAL LOW (ref 26.0–34.0)
MCHC: 31.6 g/dL (ref 30.0–36.0)
MCV: 77.2 fL — ABNORMAL LOW (ref 80.0–100.0)
Monocytes Absolute: 0.4 10*3/uL (ref 0.1–1.0)
Monocytes Relative: 7 %
Neutro Abs: 3.3 10*3/uL (ref 1.7–7.7)
Neutrophils Relative %: 65 %
Platelets: 270 10*3/uL (ref 150–400)
RBC: 3.77 MIL/uL — ABNORMAL LOW (ref 3.87–5.11)
RDW: 15.7 % — ABNORMAL HIGH (ref 11.5–15.5)
WBC: 5.1 10*3/uL (ref 4.0–10.5)
nRBC: 0 % (ref 0.0–0.2)

## 2021-09-14 LAB — BASIC METABOLIC PANEL
Anion gap: 6 (ref 5–15)
BUN: 6 mg/dL (ref 6–20)
CO2: 17 mmol/L — ABNORMAL LOW (ref 22–32)
Calcium: 8.1 mg/dL — ABNORMAL LOW (ref 8.9–10.3)
Chloride: 116 mmol/L — ABNORMAL HIGH (ref 98–111)
Creatinine, Ser: 0.85 mg/dL (ref 0.44–1.00)
GFR, Estimated: >60 mL/min (ref >60)
Glucose, Bld: 105 mg/dL — ABNORMAL HIGH (ref 70–99)
Potassium: 3.7 mmol/L (ref 3.5–5.1)
Sodium: 139 mmol/L (ref 135–145)

## 2021-09-14 NOTE — Progress Notes (Signed)
PROGRESS NOTE  Connie Mcneil  QPY:[id number] DOB: August 26, 1965 DOA: 09/11/2021 PCP: Courtney Paris, NP   Brief Narrative: Patient is a 56 year old female with history of GERD, obesity who presented here with complaints of fatigue, generalized weakness, back pain, nausea, vomiting.  On presentation she was febrile, tachycardic, hypertensive.  Patient was also complaining of right flank pain.  CT imaging showed right-sided perinephric inflammatory fat stranding suspicious for acute pyelonephritis.  Patient was admitted for the management of pyelonephritis, started on ceftriaxone. She complains of low back pain. Consulted PT for persistent back pain.  Assessment & Plan:  Principal Problem:   Pyelonephritis Active Problems:   Severe sepsis (HCC)   Hypokalemia   Vitamin D deficiency   Low back pain   Assessment and Plan: * Pyelonephritis Presented with right flank pain.  CT imagings consistent  of right-sided pyelonephritis.  Currently on ceftriaxone.  Follow-up urine culture, blood culture.  Afebrile today.  Complains of intermittent chills.  Sepsis due to gram-negative UTI (HCC)-resolved as of 09/13/2021 - This is manifested by fever and tachycardia - Management as above. - The patient will be can IV Rocephin. - She will be hydrated with IV normal saline and will follow urine and blood cultures.  Severe sepsis (HCC)  Continue current antibiotics, gentle IV fluids. Had high-grade fever yesterday.  Blood pressure remains soft  Hypokalemia Supplemented and corrected  Vitamin D deficiency Continue supplementation  Low back pain Unclear etiology.  Complains of persistent low back pain.  Lumbar x-ray did not show any acute findings.  Continue muscle relaxants, pain medications and supportive care.  PT consulted              DVT prophylaxis:enoxaparin (LOVENOX) injection 40 mg Start: 09/12/21 1000     Code Status: Full Code  Family Communication: None at bedside  Patient  status:Inpatient  Patient is from :Home  Anticipated discharge TI:[id number]  Estimated DC date:tomorrow   Consultants: None  Procedures:None  Antimicrobials:  Anti-infectives (From admission, onward)    Start     Dose/Rate Route Frequency Ordered Stop   09/13/21 0800  cefTRIAXone (ROCEPHIN) 2 g in sodium chloride 0.9 % 100 mL IVPB        2 g 200 mL/hr over 30 Minutes Intravenous Every 24 hours 09/12/21 0431 09/20/21 0759   09/12/21 0300  cefTRIAXone (ROCEPHIN) 2 g in sodium chloride 0.9 % 100 mL IVPB        2 g 200 mL/hr over 30 Minutes Intravenous  Once 09/12/21 0257 09/12/21 0449       Subjective:  Patient seen and examined the bedside this morning.  Remains weak, lying in bed.  Had fever last night.  No fever this morning.  Back pain persists  Objective: Vitals:   09/13/21 2305 09/14/21 0114 09/14/21 0528 09/14/21 0907  BP: (!) 114/57 (!) 114/59 90/60 110/61  Pulse: 79 65 65 65  Resp: 19 17 19 18   Temp: (!) 101.3 F (38.5 C) 98.1 F (36.7 C) 98.2 F (36.8 C) 98.4 F (36.9 C)  TempSrc: Oral Oral Oral Oral  SpO2: 96% 97% 98% 97%    Intake/Output Summary (Last 24 hours) at 09/14/2021 1141 Last data filed at 09/13/2021 1600 Gross per 24 hour  Intake 220 ml  Output --  Net 220 ml   There were no vitals filed for this visit.  Examination:  General exam: Overall comfortable, not in distress,obese,weak looking HEENT: PERRL Respiratory system:  no wheezes or crackles  Cardiovascular system: S1 & S2 heard,  RRR.  Gastrointestinal system: Abdomen is nondistended, soft and nontender. Central nervous system: Alert and oriented Extremities: No edema, no clubbing ,no cyanosis Skin: No rashes, no ulcers,no icterus     Data Reviewed: I have personally reviewed following labs and imaging studies  CBC: Recent Labs  Lab 09/11/21 2207 09/12/21 0644 09/13/21 0205 09/14/21 0145  WBC 7.0 5.1 4.6 5.1  NEUTROABS 6.1  --  3.2 3.3  HGB 11.4* 10.5* 10.1* 9.2*  HCT 36.0  33.0* 32.1* 29.1*  MCV 77.4* 79.3* 78.9* 77.2*  PLT 336 300 265 270   Basic Metabolic Panel: Recent Labs  Lab 09/11/21 2207 09/12/21 0644 09/13/21 0205 09/14/21 0145  NA 136 139 137 139  K 3.2* 3.9 3.4* 3.7  CL 109 114* 111 116*  CO2 18* 17* 17* 17*  GLUCOSE 149* 118* 105* 105*  BUN 11 10 8 6   CREATININE 0.89 0.84 0.92 0.85  CALCIUM 9.0 8.0* 7.9* 8.1*  MG  --  1.9  --   --      Recent Results (from the past 240 hour(s))  Urine Culture     Status: Abnormal   Collection Time: 09/11/21  8:25 AM   Specimen: Urine, Clean Catch  Result Value Ref Range Status   Specimen Description URINE, CLEAN CATCH  Final   Special Requests   Final    NONE Performed at [facility], [address] [place], [place] [address]    Culture MULTIPLE SPECIES PRESENT, SUGGEST RECOLLECTION (A)  Final   Report Status 09/13/2021 FINAL  Final  Resp Panel by RT-PCR (Flu A&B, Covid) Nasopharyngeal Swab     Status: None   Collection Time: 09/11/21  9:52 PM   Specimen: Nasopharyngeal Swab; Nasopharyngeal(NP) swabs in vial transport medium  Result Value Ref Range Status   SARS Coronavirus 2 by RT PCR NEGATIVE NEGATIVE Final    Comment: (NOTE) SARS-CoV-2 target nucleic acids are NOT DETECTED.  The SARS-CoV-2 RNA is generally detectable in upper respiratory specimens during the acute phase of infection. The lowest concentration of SARS-CoV-2 viral copies this assay can detect is 138 copies/mL. A negative result does not preclude SARS-Cov-2 infection and should not be used as the sole basis for treatment or other patient management decisions. A negative result may occur with  improper specimen collection/handling, submission of specimen other than nasopharyngeal swab, presence of viral mutation(s) within the areas targeted by this assay, and inadequate number of viral copies(<138 copies/mL). A negative result must be combined with clinical observations, patient history, and  epidemiological information. The expected result is Negative.  Fact Sheet for Patients:  BloggerCourse.comhttps://www.fda.gov/media/152166/download  Fact Sheet for Healthcare Providers:  SeriousBroker.ithttps://www.fda.gov/media/152162/download  This test is no t yet approved or cleared by the [place] FDA and  has been authorized for detection and/or diagnosis of SARS-CoV-2 by FDA under an Emergency Use Authorization (EUA). This EUA will remain  in effect (meaning this test can be used) for the duration of the COVID-19 declaration under Section 564(b)(1) of the Act, 21 U.S.C.section 360bbb-3(b)(1), unless the authorization is terminated  or revoked sooner.       Influenza A by PCR NEGATIVE NEGATIVE Final   Influenza B by PCR NEGATIVE NEGATIVE Final    Comment: (NOTE) The Xpert Xpress SARS-CoV-2/FLU/RSV plus assay is intended as an aid in the diagnosis of influenza from Nasopharyngeal swab specimens and should not be used as a sole basis for treatment. Nasal washings and aspirates are unacceptable for Xpert Xpress SARS-CoV-2/FLU/RSV testing.  Fact Sheet  for Patients: BloggerCourse.com  Fact Sheet for Healthcare Providers: SeriousBroker.it  This test is not yet approved or cleared by the [place] FDA and has been authorized for detection and/or diagnosis of SARS-CoV-2 by FDA under an Emergency Use Authorization (EUA). This EUA will remain in effect (meaning this test can be used) for the duration of the COVID-19 declaration under Section 564(b)(1) of the Act, 21 U.S.C. section 360bbb-3(b)(1), unless the authorization is terminated or revoked.  Performed at [facility], [address] [place], [place] [address]   Culture, blood (Routine x 2)     Status: None (Preliminary result)   Collection Time: 09/11/21 10:06 PM   Specimen: BLOOD  Result Value Ref Range Status   Specimen Description BLOOD RIGHT ANTECUBITAL  Final   Special Requests  AEROBIC BOTTLE ONLY Blood Culture adequate volume  Final   Culture   Final    NO GROWTH 3 DAYS Performed at [facility], [address] [place], [place] [address]    Report Status PENDING  Incomplete  Culture, blood (Routine x 2)     Status: None (Preliminary result)   Collection Time: 09/11/21 10:07 PM   Specimen: BLOOD LEFT ARM  Result Value Ref Range Status   Specimen Description BLOOD LEFT ARM  Final   Special Requests   Final    BOTTLES DRAWN AEROBIC AND ANAEROBIC Blood Culture adequate volume   Culture   Final    NO GROWTH 3 DAYS Performed at [facility], [address] [place], [place] [address]    Report Status PENDING  Incomplete     Radiology Studies: DG Lumbar Spine 2-3 Views  Result Date: 09/13/2021 CLINICAL DATA:  Chills, fever, back pain EXAM: LUMBAR SPINE - 2-3 VIEW COMPARISON:  08/25/2013; correlation CT chest abdomen pelvis 09/12/2021 FINDINGS: Incomplete Lumbarization of S1. Vertebral body and disc space heights maintained. Question mild osseous demineralization. Vertebral body heights maintained without fracture or subluxation. No bone destruction or gross spondylolysis. SI joints preserved. LEFT paraspinal calcifications at L4-L5 increased from previous exam; recent CT demonstrates these are gonadal vein phleboliths. Numerous pelvic phleboliths. Surgical clips RIGHT upper quadrant question cholecystectomy. IMPRESSION: No acute lumbar spine abnormalities. Electronically Signed   By: Ulyses Southward M.D.   On: 09/13/2021 12:43    Scheduled Meds:  enoxaparin (LOVENOX) injection  40 mg Subcutaneous Daily   topiramate  100 mg Oral Daily   Vitamin D (Ergocalciferol)  50,000 Units Oral Q Fri   Continuous Infusions:  sodium chloride 125 mL/hr at 09/14/21 0329   cefTRIAXone (ROCEPHIN)  IV 2 g (09/14/21 0840)     LOS: 2 days   Burnadette Pop, MD [facility] P3/05/2022, 11:41 AM

## 2021-09-15 LAB — CBC WITH DIFFERENTIAL/PLATELET
Abs Immature Granulocytes: 0.01 10*3/uL (ref 0.00–0.07)
Basophils Absolute: 0 10*3/uL (ref 0.0–0.1)
Basophils Relative: 0 %
Eosinophils Absolute: 0.1 10*3/uL (ref 0.0–0.5)
Eosinophils Relative: 1 %
HCT: 27.4 % — ABNORMAL LOW (ref 36.0–46.0)
Hemoglobin: 9 g/dL — ABNORMAL LOW (ref 12.0–15.0)
Immature Granulocytes: 0 %
Lymphocytes Relative: 24 %
Lymphs Abs: 1.2 10*3/uL (ref 0.7–4.0)
MCH: 25.3 pg — ABNORMAL LOW (ref 26.0–34.0)
MCHC: 32.8 g/dL (ref 30.0–36.0)
MCV: 77 fL — ABNORMAL LOW (ref 80.0–100.0)
Monocytes Absolute: 0.5 10*3/uL (ref 0.1–1.0)
Monocytes Relative: 9 %
Neutro Abs: 3.3 10*3/uL (ref 1.7–7.7)
Neutrophils Relative %: 66 %
Platelets: 262 10*3/uL (ref 150–400)
RBC: 3.56 MIL/uL — ABNORMAL LOW (ref 3.87–5.11)
RDW: 15.5 % (ref 11.5–15.5)
WBC: 5.1 10*3/uL (ref 4.0–10.5)
nRBC: 0 % (ref 0.0–0.2)

## 2021-09-15 LAB — BASIC METABOLIC PANEL
Anion gap: 7 (ref 5–15)
BUN: 5 mg/dL — ABNORMAL LOW (ref 6–20)
CO2: 16 mmol/L — ABNORMAL LOW (ref 22–32)
Calcium: 8 mg/dL — ABNORMAL LOW (ref 8.9–10.3)
Chloride: 114 mmol/L — ABNORMAL HIGH (ref 98–111)
Creatinine, Ser: 0.69 mg/dL (ref 0.44–1.00)
GFR, Estimated: >60 mL/min (ref >60)
Glucose, Bld: 108 mg/dL — ABNORMAL HIGH (ref 70–99)
Potassium: 3.6 mmol/L (ref 3.5–5.1)
Sodium: 137 mmol/L (ref 135–145)

## 2021-09-15 MED ORDER — SODIUM BICARBONATE 8.4 % IV SOLN
INTRAVENOUS | Status: DC
  Filled 2021-09-15: qty 1000
  Filled 2021-09-15: qty 150

## 2021-09-15 NOTE — Progress Notes (Addendum)
PROGRESS NOTE  [place] Mizrahi  [id number] DOB: 07/04/1966 DOA: 09/11/2021 PCP: Simona Huh, NP   Brief Narrative: Patient is a 56 year old female with history of GERD, obesity who presented here with complaints of fatigue, generalized weakness, back pain, nausea, vomiting.  On presentation she was febrile, tachycardic, hypertensive.  Patient was also complaining of right flank pain.  CT imaging showed right-sided perinephric inflammatory fat stranding suspicious for acute pyelonephritis.  Patient was admitted for the management of pyelonephritis, started on ceftriaxone. Hospital course remarkable for slow recovery, shivering, fever, low back pain, weakness.  Assessment & Plan:  Principal Problem:   Pyelonephritis Active Problems:   Severe sepsis (HCC)   Hypokalemia   Vitamin D deficiency   Low back pain   Assessment and Plan: * Pyelonephritis Presented with right flank pain.  CT imagings consistent  of right-sided pyelonephritis.  Currently on ceftriaxone.  Blood cultures, no growth till date.  Urine culture showed multiple species.  Afebrile today.  Complains of intermittent chills.  Sepsis due to gram-negative UTI (HCC)-resolved as of 09/13/2021 - This is manifested by fever and tachycardia - Management as above. - The patient will be can IV Rocephin. - She will be hydrated with IV normal saline and will follow urine and blood cultures.  Severe sepsis (HCC)  Continue current antibiotics, gentle IV fluids.Blood pressure remains soft.  Afebrile this morning  Hypokalemia Supplemented and corrected  Vitamin D deficiency Continue supplementation  Low back pain Unclear etiology.  Complains of persistent low back pain.  Lumbar x-ray did not show any acute findings.  Continue muscle relaxants, pain medications and supportive care.  PT consulted, recommended home health              DVT prophylaxis:enoxaparin (LOVENOX) injection 40 mg Start: 09/12/21 1000     Code  Status: Full Code  Family Communication: None at bedside  Patient status:Inpatient  Patient is from :Home  Anticipated discharge RC:[id number]  Estimated DC date:tomorrow   Consultants: None  Procedures:None  Antimicrobials:  Anti-infectives (From admission, onward)    Start     Dose/Rate Route Frequency Ordered Stop   09/13/21 0800  cefTRIAXone (ROCEPHIN) 2 g in sodium chloride 0.9 % 100 mL IVPB        2 g 200 mL/hr over 30 Minutes Intravenous Every 24 hours 09/12/21 0431 09/20/21 0759   09/12/21 0300  cefTRIAXone (ROCEPHIN) 2 g in sodium chloride 0.9 % 100 mL IVPB        2 g 200 mL/hr over 30 Minutes Intravenous  Once 09/12/21 0257 09/12/21 0449       Subjective:  Patient seen and examined at the bedside this morning.  Hemodynamically stable overall.  Continues to remain weak.  I recommended her to try to ambulate, sit in the chair.  Back pain is better today but she is bothered by nausea this morning.  Afebrile today.  Objective: Vitals:   09/14/21 2033 09/15/21 0423 09/15/21 0609 09/15/21 0844  BP: (!) 92/41 (!) 114/54 97/61 96/72   Pulse: 80 65 67 72  Resp: 16 17 17 18   Temp: 99.1 F (37.3 C) 98 F (36.7 C) 98.3 F (36.8 C) 98.3 F (36.8 C)  TempSrc: Oral Oral Oral Oral  SpO2: 97% 95% 97% 97%    Intake/Output Summary (Last 24 hours) at 09/15/2021 1126 Last data filed at 09/15/2021 0618 Gross per 24 hour  Intake 1695 ml  Output 650 ml  Net 1045 ml   There were no vitals filed for this visit.  Examination:  General exam: Not in distress,obese, chronically ill looking HEENT: PERRL Respiratory system:  no wheezes or crackles  Cardiovascular system: S1 & S2 heard, RRR.  Gastrointestinal system: Abdomen is nondistended, soft and nontender. Central nervous system: Alert and oriented Extremities: No edema, no clubbing ,no cyanosis Skin: No rashes, no ulcers,no icterus     Data Reviewed: I have personally reviewed following labs and imaging  studies  CBC: Recent Labs  Lab 09/11/21 2207 09/12/21 0644 09/13/21 0205 09/14/21 0145 09/15/21 0130  WBC 7.0 5.1 4.6 5.1 5.1  NEUTROABS 6.1  --  3.2 3.3 3.3  HGB 11.4* 10.5* 10.1* 9.2* 9.0*  HCT 36.0 33.0* 32.1* 29.1* 27.4*  MCV 77.4* 79.3* 78.9* 77.2* 77.0*  PLT 336 300 265 270 [id number]   Basic Metabolic Panel: Recent Labs  Lab 09/11/21 2207 09/12/21 0644 09/13/21 0205 09/14/21 0145 09/15/21 0130  NA 136 139 137 139 137  K 3.2* 3.9 3.4* 3.7 3.6  CL 109 114* 111 116* 114*  CO2 18* 17* 17* 17* 16*  GLUCOSE 149* 118* 105* 105* 108*  BUN 11 10 8 6  5*  CREATININE 0.89 0.84 0.92 0.85 0.69  CALCIUM 9.0 8.0* 7.9* 8.1* 8.0*  MG  --  1.9  --   --   --      Recent Results (from the past 240 hour(s))  Urine Culture     Status: Abnormal   Collection Time: 09/11/21  8:25 AM   Specimen: Urine, Clean Catch  Result Value Ref Range Status   Specimen Description URINE, CLEAN CATCH  Final   Special Requests   Final    NONE Performed at [facility], [address], [place], [place] [address]    Culture MULTIPLE SPECIES PRESENT, SUGGEST RECOLLECTION (A)  Final   Report Status 09/13/2021 FINAL  Final  Resp Panel by RT-PCR (Flu A&B, Covid) Nasopharyngeal Swab     Status: None   Collection Time: 09/11/21  9:52 PM   Specimen: Nasopharyngeal Swab; Nasopharyngeal(NP) swabs in vial transport medium  Result Value Ref Range Status   SARS Coronavirus 2 by RT PCR NEGATIVE NEGATIVE Final    Comment: (NOTE) SARS-CoV-2 target nucleic acids are NOT DETECTED.  The SARS-CoV-2 RNA is generally detectable in upper respiratory specimens during the acute phase of infection. The lowest concentration of SARS-CoV-2 viral copies this assay can detect is 138 copies/mL. A negative result does not preclude SARS-Cov-2 infection and should not be used as the sole basis for treatment or other patient management decisions. A negative result may occur with  improper specimen collection/handling,  submission of specimen other than nasopharyngeal swab, presence of viral mutation(s) within the areas targeted by this assay, and inadequate number of viral copies(<138 copies/mL). A negative result must be combined with clinical observations, patient history, and epidemiological information. The expected result is Negative.  Fact Sheet for Patients:  EntrepreneurPulse.com.au  Fact Sheet for Healthcare Providers:  IncredibleEmployment.be  This test is no t yet approved or cleared by the [place] FDA and  has been authorized for detection and/or diagnosis of SARS-CoV-2 by FDA under an Emergency Use Authorization (EUA). This EUA will remain  in effect (meaning this test can be used) for the duration of the COVID-19 declaration under Section 564(b)(1) of the Act, 21 U.S.C.section 360bbb-3(b)(1), unless the authorization is terminated  or revoked sooner.       Influenza A by PCR NEGATIVE NEGATIVE Final   Influenza B by PCR NEGATIVE NEGATIVE Final  Comment: (NOTE) The Xpert Xpress SARS-CoV-2/FLU/RSV plus assay is intended as an aid in the diagnosis of influenza from Nasopharyngeal swab specimens and should not be used as a sole basis for treatment. Nasal washings and aspirates are unacceptable for Xpert Xpress SARS-CoV-2/FLU/RSV testing.  Fact Sheet for Patients: EntrepreneurPulse.com.au  Fact Sheet for Healthcare Providers: IncredibleEmployment.be  This test is not yet approved or cleared by the [place] FDA and has been authorized for detection and/or diagnosis of SARS-CoV-2 by FDA under an Emergency Use Authorization (EUA). This EUA will remain in effect (meaning this test can be used) for the duration of the COVID-19 declaration under Section 564(b)(1) of the Act, 21 U.S.C. section 360bbb-3(b)(1), unless the authorization is terminated or revoked.  Performed at [facility], [place] [address], [place], [place] [address]   Culture, blood (Routine x 2)     Status: None (Preliminary result)   Collection Time: 09/11/21 10:06 PM   Specimen: BLOOD  Result Value Ref Range Status   Specimen Description BLOOD RIGHT ANTECUBITAL  Final   Special Requests AEROBIC BOTTLE ONLY Blood Culture adequate volume  Final   Culture   Final    NO GROWTH 4 DAYS Performed at [facility], [place] [address] [place], [place] [address]    Report Status PENDING  Incomplete  Culture, blood (Routine x 2)     Status: None (Preliminary result)   Collection Time: 09/11/21 10:07 PM   Specimen: BLOOD LEFT ARM  Result Value Ref Range Status   Specimen Description BLOOD LEFT ARM  Final   Special Requests   Final    BOTTLES DRAWN AEROBIC AND ANAEROBIC Blood Culture adequate volume   Culture   Final    NO GROWTH 4 DAYS Performed at [facility], [place] [address], [place], [place] [address]    Report Status PENDING  Incomplete     Radiology Studies: DG Lumbar Spine 2-3 Views  Result Date: 09/13/2021 CLINICAL DATA:  Chills, fever, back pain EXAM: LUMBAR SPINE - 2-3 VIEW COMPARISON:  08/25/2013; correlation CT chest abdomen pelvis 09/12/2021 FINDINGS: Incomplete Lumbarization of S1. Vertebral body and disc space heights maintained. Question mild osseous demineralization. Vertebral body heights maintained without fracture or subluxation. No bone destruction or gross spondylolysis. SI joints preserved. LEFT paraspinal calcifications at L4-L5 increased from previous exam; recent CT demonstrates these are gonadal vein phleboliths. Numerous pelvic phleboliths. Surgical clips RIGHT upper quadrant question cholecystectomy. IMPRESSION: No acute lumbar spine abnormalities. Electronically Signed   By: Lavonia Dana M.D.   On: 09/13/2021 12:43    Scheduled Meds:  enoxaparin (LOVENOX) injection  40 mg Subcutaneous Daily   topiramate  100 mg Oral Daily   Vitamin D (Ergocalciferol)  50,000 Units Oral Q Fri    Continuous Infusions:  sodium chloride 125 mL/hr at 09/15/21 0618   cefTRIAXone (ROCEPHIN)  IV 2 g (09/15/21 0845)     LOS: 3 days   Shelly Coss, MD [facility] P3/06/2022, 11:26 AM

## 2021-09-16 LAB — BASIC METABOLIC PANEL
Anion gap: 6 (ref 5–15)
BUN: <5 mg/dL — ABNORMAL LOW (ref 6–20)
CO2: 21 mmol/L — ABNORMAL LOW (ref 22–32)
Calcium: 8.3 mg/dL — ABNORMAL LOW (ref 8.9–10.3)
Chloride: 111 mmol/L (ref 98–111)
Creatinine, Ser: 0.76 mg/dL (ref 0.44–1.00)
GFR, Estimated: >60 mL/min (ref >60)
Glucose, Bld: 117 mg/dL — ABNORMAL HIGH (ref 70–99)
Potassium: 3.3 mmol/L — ABNORMAL LOW (ref 3.5–5.1)
Sodium: 138 mmol/L (ref 135–145)

## 2021-09-16 LAB — CULTURE, BLOOD (ROUTINE X 2)
Culture: NO GROWTH
Culture: NO GROWTH
Special Requests: ADEQUATE
Special Requests: ADEQUATE

## 2021-09-16 MED ORDER — POTASSIUM CHLORIDE CRYS ER 20 MEQ PO TBCR
40.0000 meq | EXTENDED_RELEASE_TABLET | Freq: Once | ORAL | Status: AC
  Administered 2021-09-16: 40 meq via ORAL
  Filled 2021-09-16: qty 2

## 2021-09-16 MED ORDER — CEFDINIR 300 MG PO CAPS: 300.0000 mg | ORAL_CAPSULE | Freq: Two times a day (BID) | ORAL | 0 refills | Status: AC

## 2021-09-16 MED ORDER — METHOCARBAMOL 750 MG PO TABS: 750.0000 mg | ORAL_TABLET | Freq: Four times a day (QID) | ORAL | 0 refills | Status: DC | PRN

## 2021-09-16 MED ORDER — BISACODYL 5 MG PO TBEC
5.0000 mg | DELAYED_RELEASE_TABLET | Freq: Every day | ORAL | Status: DC | PRN
  Administered 2021-09-16: 5 mg via ORAL
  Filled 2021-09-16: qty 1

## 2021-09-16 NOTE — Progress Notes (Signed)
Physical Therapy Treatment ?Patient Details ?Name: Connie Mcneil ?MRN: [id number] ?DOB: [birth year] ?Today's Date: 09/16/2021 ? ? ?History of Present Illness 56 y/o female presented to ED on 09/11/21 for back pain, N/V, and diarrhea. CT showed R sided perinephric inflammatory fat stranding suspicious for acute pyelonephritis. Dx with sepsis, low K+, LBP and tachycardia.  PMHx:GERD ? ?  ?PT Comments  ? ? Focus of session today was functional bed mobility, transfers, and gait. The patient tolerated well but was limited secondarily to pain and decreased strength and balance.  Pt. Shows overall improvement with her ability to transfer and walk. Pt. Requires at least one UE assist and seeks external support from wall when available during all functional activity. Education on benefits of assistive device during gait for improved function, safety, and balance was performed. Pt continues to deny using any assistive device during session or at home. SPT still recommending rolling walker for improved safety and function. Pt. Would benefit from skilled PT to continue to address her functional mobility, strength, and balance. Plan and discharge setting remains unchanged. Pt to follow acutely as appropriate.  ?   ?Recommendations for follow up therapy are one component of a multi-disciplinary discharge planning process, led by the attending physician.  Recommendations may be updated based on patient status, additional functional criteria and insurance authorization. ? ?Follow Up Recommendations ? Home health PT ?  ?  ?Assistance Recommended at Discharge Intermittent Supervision/Assistance  ?Patient can return home with the following A little help with bathing/dressing/bathroom;A lot of help with walking and/or transfers;Assistance with cooking/housework;Assist for transportation;Help with stairs or ramp for entrance ?  ?Equipment Recommendations ? Rolling walker (2 wheels)  ?  ?Recommendations for Other Services   ? ? ?  ?Precautions  / Restrictions Precautions ?Precautions: Fall ?Restrictions ?Weight Bearing Restrictions: No  ?  ? ?Mobility ? Bed Mobility ?Overal bed mobility: Needs Assistance ?Bed Mobility: Supine to Sit, Sit to Supine ?  ?  ?Supine to sit: Min guard ?Sit to supine: Min assist ?  ?General bed mobility comments: Min guard to get out of bed with increased time and multiple rest breaks needed. Min A for LE management to get back into bed. ?  ? ?Transfers ?Overall transfer level: Needs assistance ?Equipment used: 1 person hand held assist (Pt. leans heavily on lower bed rail for support during STS) ?Transfers: Sit to/from Stand ?Sit to Stand: Min assist ?  ?  ?  ?  ?  ?General transfer comment: Min A for support during power up and steady, leaning heavily on lower bed rail ?  ? ?Ambulation/Gait ?Ambulation/Gait assistance: Min assist ?Gait Distance (Feet): 75 Feet ?Assistive device: 1 person hand held assist [employer] surfing) ?Gait Pattern/deviations: Step-through pattern, Decreased stride length, Staggering left, Staggering right, Ataxic ?Gait velocity: reduced ?  ?  ?General Gait Details: pt. requires UE at all times during gait, when a wall wasn't avaibale or object to lean on she is significantly more unsteady. Refuses to use AD despite max cuing ? ? ?Stairs ?  ?  ?  ?  ?  ? ? ?Wheelchair Mobility ?  ? ?Modified Rankin (Stroke Patients Only) ?  ? ? ?  ?Balance Overall balance assessment: Needs assistance ?Sitting-balance support: Feet supported ?Sitting balance-Leahy Scale: Fair ?  ?  ?Standing balance support: During functional activity, Single extremity supported ?Standing balance-Leahy Scale: Poor ?Standing balance comment: Requires at least 1 UE support for all standing, 2 when doing any dynamic movements ?  ?  ?  ?  ?  ?  ?  ?  ?  ?  ?  ?  ? ?  ?  Cognition Arousal/Alertness: Awake/alert ?Behavior During Therapy: [facility] for tasks assessed/performed ?Overall Cognitive Status: Within Functional Limits for tasks assessed ?  ?  ?  ?   ?  ?  ?  ?  ?  ?  ?  ?  ?  ?  ?  ?  ?General Comments: Pt. reports that she is feeling weak ?  ?  ? ?  ?Exercises   ? ?  ?General Comments General comments (skin integrity, edema, etc.): Shows reduced standing tolerance and limited secondary to pain ?  ?  ? ?Pertinent Vitals/Pain Pain Assessment ?Pain Assessment: Faces ?Faces Pain Scale: Hurts whole lot ?Pain Location: back and general pain ?Pain Descriptors / Indicators: Aching, Grimacing, Guarding ?Pain Intervention(s): Limited activity within patient's tolerance, Monitored during session, Patient requesting pain meds-RN notified  ? ? ?Home Living   ?  ?  ?  ?  ?  ?  ?  ?  ?  ?   ?  ?Prior Function    ?  ?  ?   ? ?PT Goals (current goals can now be found in the care plan section) Acute Rehab PT Goals ?Patient Stated Goal: to get home and pain to be managed ?PT Goal Formulation: With patient ?Time For Goal Achievement: 09/27/21 ?Potential to Achieve Goals: Good ?Progress towards PT goals: Progressing toward goals ? ?  ?Frequency ? ? ? Min 3X/week ? ? ? ?  ?PT Plan Current plan remains appropriate  ? ? ?Co-evaluation   ?  ?  ?  ?  ? ?  ?AM-PAC PT "6 Clicks" Mobility   ?Outcome Measure ? Help needed turning from your back to your side while in a flat bed without using bedrails?: A Little ?Help needed moving from lying on your back to sitting on the side of a flat bed without using bedrails?: A Little ?Help needed moving to and from a bed to a chair (including a wheelchair)?: A Little ?Help needed standing up from a chair using your arms (e.g., wheelchair or bedside chair)?: A Little ?Help needed to walk in hospital room?: A Little ?Help needed climbing 3-5 steps with a railing? : A Lot ?6 Click Score: 17 ? ?  ?End of Session Equipment Utilized During Treatment: Gait belt ?Activity Tolerance: Patient limited by pain ?Patient left: in bed;with call bell/phone within reach;with family/visitor present ?Nurse Communication: Mobility status ?PT Visit Diagnosis:  Unsteadiness on feet (R26.81);Muscle weakness (generalized) (M62.81);Pain ?Pain - Right/Left:  (Back) ?Pain - part of body:  (back) ?  ? ? ?Time: [id number] ?PT Time Calculation (min) (ACUTE ONLY): 26 min ? ?Charges:  $Gait Training: 8-22 mins ?$Therapeutic Activity: 8-22 mins          ?          ? ?Thermon Leyland, SPT ?Acute Rehab Services ? ? ?Thermon Leyland ?09/16/2021, 1:25 PM ? ?

## 2021-09-16 NOTE — Progress Notes (Signed)
According to patient, She hadn't had a BM since last Sunday. Patient remains weak and has a poor appetite that's why she haven't been eating much. Has c/o some nausea. Patient was given PRN Milk of Magnesia last night but it was ineffective. X. Blount,NP made aware and received a new order for PO Dulcolax- given at [id number]. Will continue to close monitor patient. ?

## 2021-09-16 NOTE — TOC Initial Note (Addendum)
Transition of Care (TOC) - Initial/Assessment Note  ? ? ?Patient Details  ?Name: Connie Mcneil ?MRN: [id number] ?Date of Birth: 06-17-1966 ? ?Transition of Care (TOC) CM/SW Contact:    ?Kingsley Plan, RN ?Phone Number: ?09/16/2021, 10:04 AM ? ?Clinical Narrative:                 ?Spoke to patient at bedside. Confirmed face sheet information with patient.  ? ? ?Discussed HHPT, patient in agreement if insurance covers HHPT. NCM explained home health agency will submit to insurance for authorization and then home health agency will discuss coverage.  ? ?PT also recommending walker and wheel chair. Again patient concerned regarding cost. NCM will have Velna Hatchet with [employer] call patient directly ( patient prefers cell number) to discuss coverage.  ? ? ?Patient voiced understanding.  ? ?If patient agrees to walker and or wheel chair they will be delivered to hospital room prior to discharge.  ? ?Patient talked to [employer] and decided to hold DME.  ? ?Expected Discharge Plan: Home w Home Health Services ?Barriers to Discharge: No Barriers Identified ? ? ?Patient Goals and CMS Choice ?Patient states their goals for this hospitalization and ongoing recovery are:: to return to home ?CMS Medicare.gov Compare Post Acute Care list provided to:: Patient ?Choice offered to / list presented to : Patient ? ?Expected Discharge Plan and Services ?Expected Discharge Plan: Home w Home Health Services ?  ?Discharge Planning Services: CM Consult ?Post Acute Care Choice: Home Health ?Living arrangements for the past 2 months: Single Family Home ?Expected Discharge Date: 09/16/21               ?DME Arranged: Walker rolling, Wheelchair manual ?DME Agency: [employer] ?Date DME Agency Contacted: 09/16/21 ?Time DME Agency Contacted: 1003 ?Representative spoke with at DME Agency: Velna Hatchet ?HH Arranged: PT ?HH Agency: [facility] ?Date HH Agency Contacted: 09/16/21 ?Time HH Agency Contacted: 1003 ?Representative spoke with at  [facility] Agency: Kandee Keen ? ?Prior Living Arrangements/Services ?Living arrangements for the past 2 months: Single Family Home ?Lives with:: Adult Children ?Patient language and need for interpreter reviewed:: Yes ?Do you feel safe going back to the place where you live?: Yes      ?Need for Family Participation in Patient Care: Yes (Comment) ?Care giver support system in place?: Yes (comment) ?  ?Criminal Activity/Legal Involvement Pertinent to Current Situation/Hospitalization: No - Comment as needed ? ?Activities of Daily Living ?  ?  ? ?Permission Sought/Granted ?  ?Permission granted to share information with : No ?   ?   ?   ?   ? ?Emotional Assessment ?Appearance:: Appears stated age ?Attitude/Demeanor/Rapport: Engaged ?Affect (typically observed): Accepting ?Orientation: : Oriented to Self, Oriented to Place, Oriented to  Time, Oriented to Situation ?Alcohol / Substance Use: Not Applicable ?Psych Involvement: No (comment) ? ?Admission diagnosis:  Pyelonephritis [N12] ?Patient Active Problem List  ? Diagnosis Date Noted  ? Low back pain 09/13/2021  ? Pyelonephritis 09/12/2021  ? Vitamin D deficiency 09/12/2021  ? Severe sepsis (HCC) 09/12/2021  ? Hypokalemia 09/12/2021  ? Normocytic anemia 08/07/2020  ? Gastroesophageal reflux disease without esophagitis 09/04/2014  ? Preventative health care 07/13/2014  ? Knee pain 10/26/2013  ? Obesity, Class III, BMI 40-49.9 (morbid obesity) (HCC) 10/26/2013  ? ?PCP:  Courtney Paris, NP ?Pharmacy:   ?[id number] - [place], [place] - [address] ?[address] ?[place] [address] ?Phone: [phone] Fax: [phone] ? ?[facility] [address], [place] - [address] ?[address] ?[place] [address] ?Phone: [phone] Fax: [phone] ? ?[facility] [place] - [address] AT [address] ?[address] ?Mail Order pharmacy ?[place]  [place] [address] ?Phone: [phone] Fax: [phone] ? ? ? ? ?Social Determinants of Health (SDOH) Interventions ?  ? ?Readmission Risk Interventions ?No flowsheet data found. ? ? ?

## 2021-09-16 NOTE — TOC CM/SW Note (Signed)
?  ?  Durable Medical Equipment  ?(From admission, onward)  ?  ? ? ?  ? ?  Start     Ordered  ? 09/16/21 0849  For home use only DME Walker rolling  Once       ?Question Answer Comment  ?Walker: With 5 Inch Wheels   ?Patient needs a walker to treat with the following condition Weakness   ?  ? 09/16/21 0849  ? 09/16/21 0849  For home use only DME standard manual wheelchair with seat cushion  Once       ?Comments: Patient suffers from Patient has had a recent decline in their functional status  which impairs their ability to perform daily activities like ambulating  in the home.  A cane will not resolve issue with performing activities of daily living. A wheelchair will allow patient to safely perform daily activities. Patient can safely propel the wheelchair in the home or has a caregiver who can provide assistance. Length of need lifetime . ?Accessories: elevating leg rests (ELRs), wheel locks, extensions and anti-tippers. ? ?Seat and back cushions  ? 09/16/21 0849  ? ?  ?  ? ?  ?  ?

## 2021-09-16 NOTE — Plan of Care (Signed)
?  Problem: Education: Goal: Knowledge of General Education information will improve Description: Including pain rating scale, medication(s)/side effects and non-pharmacologic comfort measures Outcome: Adequate for Discharge   Problem: Health Behavior/Discharge Planning: Goal: Ability to manage health-related needs will improve Outcome: Adequate for Discharge   Problem: Clinical Measurements: Goal: Ability to maintain clinical measurements within normal limits will improve Outcome: Adequate for Discharge Goal: Will remain free from infection Outcome: Adequate for Discharge Goal: Diagnostic test results will improve Outcome: Adequate for Discharge Goal: Respiratory complications will improve Outcome: Adequate for Discharge Goal: Cardiovascular complication will be avoided Outcome: Adequate for Discharge   Problem: Activity: Goal: Risk for activity intolerance will decrease Outcome: Adequate for Discharge   Problem: Nutrition: Goal: Adequate nutrition will be maintained Outcome: Adequate for Discharge   Problem: Coping: Goal: Level of anxiety will decrease Outcome: Adequate for Discharge   Problem: Elimination: Goal: Will not experience complications related to bowel motility Outcome: Adequate for Discharge Goal: Will not experience complications related to urinary retention Outcome: Adequate for Discharge   Problem: Pain Managment: Goal: General experience of comfort will improve Outcome: Adequate for Discharge   Problem: Safety: Goal: Ability to remain free from injury will improve Outcome: Adequate for Discharge   Problem: Skin Integrity: Goal: Risk for impaired skin integrity will decrease Outcome: Adequate for Discharge   Problem: Acute Rehab PT Goals(only PT should resolve) Goal: Pt Will Go Supine/Side To Sit Outcome: Adequate for Discharge Goal: Pt Will Transfer Bed To Chair/Chair To Bed Outcome: Adequate for Discharge Goal: Pt Will Perform Standing Balance  Or Pre-Gait Outcome: Adequate for Discharge Goal: Pt Will Ambulate Outcome: Adequate for Discharge Goal: Pt Will Go Up/Down Stairs Outcome: Adequate for Discharge   

## 2021-09-16 NOTE — Discharge Summary (Signed)
Physician Discharge Summary  Maurina Fawaz OZH:[id number] DOB: 04-11-1966 DOA: 09/11/2021  PCP: Courtney Paris, NP  Admit date: 09/11/2021 Discharge date: 09/16/2021  Admitted From: Home Disposition:  Home  Discharge Condition:Stable CODE STATUS:FULL Diet recommendation:Regular  Brief/Interim Summary:  Patient is a 56 year old female with history of GERD, obesity who presented here with complaints of fatigue, generalized weakness, back pain, nausea, vomiting.  On presentation she was febrile, tachycardic, hypertensive.  Patient was also complaining of right flank pain.  CT imaging showed right-sided perinephric inflammatory fat stranding suspicious for acute pyelonephritis.  Patient was admitted for the management of pyelonephritis, started on ceftriaxone. Hospital course remarkable for slow recovery, shivering, fever, low back pain, weakness.  Currently she is medically stable for discharge with oral antibiotics to take at home.  Physical therapy recommended home health.  Following problems were addressed during his hospitalization:  Pyelonephritis Presented with right flank pain.  CT imagings consistent  of right-sided pyelonephritis.  Currently on ceftriaxone.  Blood cultures, no growth till date.  Urine culture showed multiple species.  Afebrile today.  Antibiotics changed to oral on discharge  Severe sepsis (HCC) Blood cultures no growth till date.  Continue current antibiotics  Hypokalemia Supplemented    Vitamin D deficiency Continue supplementation   Low back pain Unclear etiology.  Complained of persistent low back pain.  Lumbar x-ray did not show any acute findings.  Continue muscle relaxants.  PT consulted, recommended home health    Discharge Diagnoses:  Principal Problem:   Pyelonephritis Active Problems:   Severe sepsis (HCC)   Hypokalemia   Vitamin D deficiency   Low back pain    Discharge Instructions  Discharge Instructions     Diet - low sodium heart  healthy   Complete by: As directed    Discharge instructions   Complete by: As directed    1)Please take prescribed medications as instructed 2)Follow up with your PCP in a week   Increase activity slowly   Complete by: As directed       Allergies as of 09/16/2021   No Known Allergies      Medication List     TAKE these medications    cefdinir 300 MG capsule Commonly known as: OMNICEF Take 1 capsule (300 mg total) by mouth 2 (two) times daily for 3 days. Start taking on: September 17, 2021   methocarbamol 750 MG tablet Commonly known as: ROBAXIN Take 1 tablet (750 mg total) by mouth every 6 (six) hours as needed for muscle spasms.   naproxen sodium 220 MG tablet Commonly known as: ALEVE Take 220 mg by mouth daily as needed (pain).   phentermine 37.5 MG capsule Take 1 capsule (37.5 mg total) by mouth every morning.   topiramate 100 MG tablet Commonly known as: Topamax Take 1 tablet (100 mg total) by mouth daily.   Vitamin D (Ergocalciferol) 1.25 MG (50000 UNIT) Caps capsule Commonly known as: DRISDOL Take 50,000 Units by mouth once a week.               Durable Medical Equipment  (From admission, onward)           Start     Ordered   09/16/21 0849  For home use only DME Walker rolling  Once       Question Answer Comment  Walker: With 5 Inch Wheels   Patient needs a walker to treat with the following condition Weakness      09/16/21 0849   09/16/21 0849  For  home use only DME standard manual wheelchair with seat cushion  Once       Comments: Patient suffers from Patient has had a recent decline in their functional status  which impairs their ability to perform daily activities like ambulating  in the home.  A cane will not resolve issue with performing activities of daily living. A wheelchair will allow patient to safely perform daily activities. Patient can safely propel the wheelchair in the home or has a caregiver who can provide assistance. Length of  need lifetime . Accessories: elevating leg rests (ELRs), wheel locks, extensions and anti-tippers.  Seat and back cushions   09/16/21 0849            Follow-up Information     Courtney Paris, NP. Schedule an appointment as soon as possible for a visit in 1 week(s).   Specialty: Nurse Practitioner Contact information: [address] [place] [address] [phone]                No Known Allergies  Consultations: None   Procedures/Studies: DG Chest 2 View  Result Date: 09/11/2021 CLINICAL DATA:  Shortness of breath with fever and chills. EXAM: CHEST - 2 VIEW COMPARISON:  08/23/2014 FINDINGS: Upper normal heart size. Subsegmental atelectasis and or scarring at the left lung base. No confluent consolidation, pleural effusion, or pneumothorax. No pulmonary edema. No acute osseous findings. IMPRESSION: Subsegmental atelectasis and or scarring at the left lung base. Electronically Signed   By: Narda Rutherford M.D.   On: 09/11/2021 22:50   DG Lumbar Spine 2-3 Views  Result Date: 09/13/2021 CLINICAL DATA:  Chills, fever, back pain EXAM: LUMBAR SPINE - 2-3 VIEW COMPARISON:  08/25/2013; correlation CT chest abdomen pelvis 09/12/2021 FINDINGS: Incomplete Lumbarization of S1. Vertebral body and disc space heights maintained. Question mild osseous demineralization. Vertebral body heights maintained without fracture or subluxation. No bone destruction or gross spondylolysis. SI joints preserved. LEFT paraspinal calcifications at L4-L5 increased from previous exam; recent CT demonstrates these are gonadal vein phleboliths. Numerous pelvic phleboliths. Surgical clips RIGHT upper quadrant question cholecystectomy. IMPRESSION: No acute lumbar spine abnormalities. Electronically Signed   By: Ulyses Southward M.D.   On: 09/13/2021 12:43   CT CHEST ABDOMEN PELVIS W CONTRAST  Result Date: 09/12/2021 CLINICAL DATA:  Fever, chills and body aches. EXAM: CT CHEST, ABDOMEN, AND PELVIS WITH CONTRAST  TECHNIQUE: Multidetector CT imaging of the chest, abdomen and pelvis was performed following the standard protocol during bolus administration of intravenous contrast. RADIATION DOSE REDUCTION: This exam was performed according to the departmental dose-optimization program which includes automated exposure control, adjustment of the mA and/or kV according to patient size and/or use of iterative reconstruction technique. CONTRAST:  OMNIPAQUE IOHEXOL 300 MG/ML  SOLN COMPARISON:  June 28, 2018 FINDINGS: CT CHEST FINDINGS Cardiovascular: There is mild calcification of the aortic arch, without evidence of aortic aneurysm or dissection. Normal heart size. No pericardial effusion. Mediastinum/Nodes: There is mild AP window lymphadenopathy. Thyroid gland, trachea, and esophagus demonstrate no significant findings. Lungs/Pleura: Mild linear atelectasis is seen within the right middle lobe and left lower lobe. Left lower lobe bronchiectasis is also noted. A 4 mm solid, noncalcified lung nodule is seen along the periphery of the right middle lobe (axial CT image 77, CT series 5). There is no evidence of acute infiltrate, pleural effusion or pneumothorax. Musculoskeletal: No chest wall mass or suspicious bone lesions identified. CT ABDOMEN PELVIS FINDINGS Hepatobiliary: 7 mm and 8 mm foci of parenchymal low attenuation  are seen within the anteromedial aspect of the right lobe of the liver. These are too small to characterize by CT examination. Status post cholecystectomy. The common bile duct measures approximately 9 mm caliber. Pancreas: Unremarkable. No pancreatic ductal dilatation or surrounding inflammatory changes. Spleen: Normal in size without focal abnormality. Adrenals/Urinary Tract: Adrenal glands are unremarkable. Kidneys are normal in size, without obstructing renal calculi or hydronephrosis. Bilateral 2 mm nonobstructing renal calculi are noted. Innumerable small bilateral renal cysts are seen. There is  mild to moderate severity right-sided perinephric inflammatory fat stranding. Bladder is unremarkable. Stomach/Bowel: Stomach is within normal limits. The appendix is surgically absent. No evidence of bowel wall thickening, distention, or inflammatory changes. Vascular/Lymphatic: No significant vascular findings are present. No enlarged abdominal or pelvic lymph nodes. Reproductive: Status post hysterectomy. No adnexal masses. Other: No abdominal wall hernia or abnormality. No abdominopelvic ascites. Musculoskeletal: No acute or significant osseous findings. IMPRESSION: 1. Right-sided perinephric inflammatory fat stranding, suspicious for sequelae associated with acute pyelonephritis. Correlation with urinalysis is recommended. 2. 4 mm solid, noncalcified right middle lobe lung nodule. No follow-up needed if patient is low-risk.This recommendation follows the consensus statement: Guidelines for Management of Incidental Pulmonary Nodules Detected on CT Images: From the Fleischner Society 2017; Radiology 2017; 284:228-243. 3. Innumerable small bilateral renal cysts. 4. Bilateral 2 mm nonobstructing renal calculi. 5. Aortic atherosclerosis. Aortic Atherosclerosis (ICD10-I70.0). Electronically Signed   By: Aram Candela M.D.   On: 09/12/2021 02:40      Subjective: Patient seen and examined at the bedside this morning.  Hemodynamically stable today.  Afebrile.  Complains of weakness but stable for discharge today  Discharge Exam: Vitals:   09/16/21 0318 09/16/21 0822  BP: 113/66 (!) 124/54  Pulse: 70 67  Resp: 16 19  Temp: 98.8 F (37.1 C) 98 F (36.7 C)  SpO2: 98% 97%   Vitals:   09/15/21 1800 09/15/21 2108 09/16/21 0318 09/16/21 0822  BP: (!) 116/57 113/63 113/66 (!) 124/54  Pulse: 73 73 70 67  Resp: Temp: 98.8 F (37.1 C) 98.5 F (36.9 C) 98.8 F (37.1 C) 98 F (36.7 C)  TempSrc: Oral Oral Oral Oral  SpO2: 96% 99% 98% 97%    General: Pt is alert, awake, not in acute  distress Cardiovascular: RRR, S1/S2 +, no rubs, no gallops Respiratory: CTA bilaterally, no wheezing, no rhonchi Abdominal: Soft, NT, ND, bowel sounds + Extremities: no edema, no cyanosis    The results of significant diagnostics from this hospitalization (including imaging, microbiology, ancillary and laboratory) are listed below for reference.     Microbiology: Recent Results (from the past 240 hour(s))  Urine Culture     Status: Abnormal   Collection Time: 09/11/21  8:25 AM   Specimen: Urine, Clean Catch  Result Value Ref Range Status   Specimen Description URINE, CLEAN CATCH  Final   Special Requests   Final    NONE Performed at [facility], [address] [place], [place] [address]    Culture MULTIPLE SPECIES PRESENT, SUGGEST RECOLLECTION (A)  Final   Report Status 09/13/2021 FINAL  Final  Resp Panel by RT-PCR (Flu A&B, Covid) Nasopharyngeal Swab     Status: None   Collection Time: 09/11/21  9:52 PM   Specimen: Nasopharyngeal Swab; Nasopharyngeal(NP) swabs in vial transport medium  Result Value Ref Range Status   SARS Coronavirus 2 by RT PCR NEGATIVE NEGATIVE Final    Comment: (NOTE) SARS-CoV-2 target nucleic acids are NOT DETECTED.  The SARS-CoV-2 RNA is generally detectable in upper respiratory specimens during the acute phase of infection. The lowest concentration of SARS-CoV-2 viral copies this assay can detect is 138 copies/mL. A negative result does not preclude SARS-Cov-2 infection and should not be used as the sole basis for treatment or other patient management decisions. A negative result may occur with  improper specimen collection/handling, submission of specimen other than nasopharyngeal swab, presence of viral mutation(s) within the areas targeted by this assay, and inadequate number of viral copies(<138 copies/mL). A negative result must be combined with clinical observations, patient history, and epidemiological information. The expected result  is Negative.  Fact Sheet for Patients:  BloggerCourse.comhttps://www.fda.gov/media/152166/download  Fact Sheet for Healthcare Providers:  SeriousBroker.ithttps://www.fda.gov/media/152162/download  This test is no t yet approved or cleared by the [place] FDA and  has been authorized for detection and/or diagnosis of SARS-CoV-2 by FDA under an Emergency Use Authorization (EUA). This EUA will remain  in effect (meaning this test can be used) for the duration of the COVID-19 declaration under Section 564(b)(1) of the Act, 21 U.S.C.section 360bbb-3(b)(1), unless the authorization is terminated  or revoked sooner.       Influenza A by PCR NEGATIVE NEGATIVE Final   Influenza B by PCR NEGATIVE NEGATIVE Final    Comment: (NOTE) The Xpert Xpress SARS-CoV-2/FLU/RSV plus assay is intended as an aid in the diagnosis of influenza from Nasopharyngeal swab specimens and should not be used as a sole basis for treatment. Nasal washings and aspirates are unacceptable for Xpert Xpress SARS-CoV-2/FLU/RSV testing.  Fact Sheet for Patients: BloggerCourse.comhttps://www.fda.gov/media/152166/download  Fact Sheet for Healthcare Providers: SeriousBroker.ithttps://www.fda.gov/media/152162/download  This test is not yet approved or cleared by the [place] FDA and has been authorized for detection and/or diagnosis of SARS-CoV-2 by FDA under an Emergency Use Authorization (EUA). This EUA will remain in effect (meaning this test can be used) for the duration of the COVID-19 declaration under Section 564(b)(1) of the Act, 21 U.S.C. section 360bbb-3(b)(1), unless the authorization is terminated or revoked.  Performed at [facility], [address] [place], [place] [address]   Culture, blood (Routine x 2)     Status: None   Collection Time: 09/11/21 10:06 PM   Specimen: BLOOD  Result Value Ref Range Status   Specimen Description BLOOD RIGHT ANTECUBITAL  Final   Special Requests AEROBIC BOTTLE ONLY Blood Culture adequate volume  Final   Culture    Final    NO GROWTH 5 DAYS Performed at [facility], [address] [place], [place] [address]    Report Status 09/16/2021 FINAL  Final  Culture, blood (Routine x 2)     Status: None   Collection Time: 09/11/21 10:07 PM   Specimen: BLOOD LEFT ARM  Result Value Ref Range Status   Specimen Description BLOOD LEFT ARM  Final   Special Requests   Final    BOTTLES DRAWN AEROBIC AND ANAEROBIC Blood Culture adequate volume   Culture   Final    NO GROWTH 5 DAYS Performed at [facility], [address] [place], [place] [address]    Report Status 09/16/2021 FINAL  Final     Labs: BNP (last 3 results) No results for input(s): BNP in the last 8760 hours. Basic Metabolic Panel: Recent Labs  Lab 09/12/21 0644 09/13/21 0205 09/14/21 0145 09/15/21 0130 09/16/21 0354  NA 139 137 139 137 138  K 3.9 3.4* 3.7 3.6 3.3*  CL 114* 111 116* 114* 111  CO2 17* 17* 17* 16* 21*  GLUCOSE 118* 105* 105* 108* 117*  BUN 5* <5*  CREATININE 0.84 0.92 0.85 0.69 0.76  CALCIUM 8.0* 7.9* 8.1* 8.0* 8.3*  MG 1.9  --   --   --   --    Liver Function Tests: Recent Labs  Lab 09/11/21 2207  AST 20  ALT 16  ALKPHOS 74  BILITOT 0.9  PROT 7.2  ALBUMIN 3.5   No results for input(s): LIPASE, AMYLASE in the last 168 hours. No results for input(s): AMMONIA in the last 168 hours. CBC: Recent Labs  Lab 09/11/21 2207 09/12/21 0644 09/13/21 0205 09/14/21 0145 09/15/21 0130  WBC 7.0 5.1 4.6 5.1 5.1  NEUTROABS 6.1  --  3.2 3.3 3.3  HGB 11.4* 10.5* 10.1* 9.2* 9.0*  HCT 36.0 33.0* 32.1* 29.1* 27.4*  MCV 77.4* 79.3* 78.9* 77.2* 77.0*  PLT 336 300 265 270 262   Cardiac Enzymes: No results for input(s): CKTOTAL, CKMB, CKMBINDEX, TROPONINI in the last 168 hours. BNP: Invalid input(s): POCBNP CBG: No results for input(s): GLUCAP in the last 168 hours. D-Dimer No results for input(s): DDIMER in the last 72 hours. Hgb A1c No results for input(s): HGBA1C in the last 72 hours. Lipid  Profile No results for input(s): CHOL, HDL, LDLCALC, TRIG, CHOLHDL, LDLDIRECT in the last 72 hours. Thyroid function studies No results for input(s): TSH, T4TOTAL, T3FREE, THYROIDAB in the last 72 hours.  Invalid input(s): FREET3 Anemia work up No results for input(s): VITAMINB12, FOLATE, FERRITIN, TIBC, IRON, RETICCTPCT in the last 72 hours. Urinalysis    Component Value Date/Time   COLORURINE YELLOW 09/12/2021 0834   APPEARANCEUR HAZY (A) 09/12/2021 0834   LABSPEC >1.046 (H) 09/12/2021 0834   PHURINE 5.0 09/12/2021 0834   GLUCOSEU NEGATIVE 09/12/2021 0834   HGBUR SMALL (A) 09/12/2021 0834   BILIRUBINUR NEGATIVE 09/12/2021 0834   KETONESUR NEGATIVE 09/12/2021 0834   PROTEINUR 30 (A) 09/12/2021 0834   UROBILINOGEN 1.0 08/23/2014 2108   NITRITE POSITIVE (A) 09/12/2021 0834   LEUKOCYTESUR MODERATE (A) 09/12/2021 0834   Sepsis Labs Invalid input(s): PROCALCITONIN,  WBC,  LACTICIDVEN Microbiology Recent Results (from the past 240 hour(s))  Urine Culture     Status: Abnormal   Collection Time: 09/11/21  8:25 AM   Specimen: Urine, Clean Catch  Result Value Ref Range Status   Specimen Description URINE, CLEAN CATCH  Final   Special Requests   Final    NONE Performed at [facility], [address], [place], [place] [address]    Culture MULTIPLE SPECIES PRESENT, SUGGEST RECOLLECTION (A)  Final   Report Status 09/13/2021 FINAL  Final  Resp Panel by RT-PCR (Flu A&B, Covid) Nasopharyngeal Swab     Status: None   Collection Time: 09/11/21  9:52 PM   Specimen: Nasopharyngeal Swab; Nasopharyngeal(NP) swabs in vial transport medium  Result Value Ref Range Status   SARS Coronavirus 2 by RT PCR NEGATIVE NEGATIVE Final    Comment: (NOTE) SARS-CoV-2 target nucleic acids are NOT DETECTED.  The SARS-CoV-2 RNA is generally detectable in upper respiratory specimens during the acute phase of infection. The lowest concentration of SARS-CoV-2 viral copies this assay can detect is 138  copies/mL. A negative result does not preclude SARS-Cov-2 infection and should not be used as the sole basis for treatment or other patient management decisions. A negative result may occur with  improper specimen collection/handling, submission of specimen other than nasopharyngeal swab, presence of viral mutation(s) within the  areas targeted by this assay, and inadequate number of viral copies(<138 copies/mL). A negative result must be combined with clinical observations, patient history, and epidemiological information. The expected result is Negative.  Fact Sheet for Patients:  BloggerCourse.com  Fact Sheet for Healthcare Providers:  SeriousBroker.it  This test is no t yet approved or cleared by the [place] FDA and  has been authorized for detection and/or diagnosis of SARS-CoV-2 by FDA under an Emergency Use Authorization (EUA). This EUA will remain  in effect (meaning this test can be used) for the duration of the COVID-19 declaration under Section 564(b)(1) of the Act, 21 U.S.C.section 360bbb-3(b)(1), unless the authorization is terminated  or revoked sooner.       Influenza A by PCR NEGATIVE NEGATIVE Final   Influenza B by PCR NEGATIVE NEGATIVE Final    Comment: (NOTE) The Xpert Xpress SARS-CoV-2/FLU/RSV plus assay is intended as an aid in the diagnosis of influenza from Nasopharyngeal swab specimens and should not be used as a sole basis for treatment. Nasal washings and aspirates are unacceptable for Xpert Xpress SARS-CoV-2/FLU/RSV testing.  Fact Sheet for Patients: BloggerCourse.com  Fact Sheet for Healthcare Providers: SeriousBroker.it  This test is not yet approved or cleared by the [place] FDA and has been authorized for detection and/or diagnosis of SARS-CoV-2 by FDA under an Emergency Use Authorization (EUA). This EUA will remain in effect (meaning  this test can be used) for the duration of the COVID-19 declaration under Section 564(b)(1) of the Act, 21 U.S.C. section 360bbb-3(b)(1), unless the authorization is terminated or revoked.  Performed at [facility], [address] [place], [place] [address]   Culture, blood (Routine x 2)     Status: None   Collection Time: 09/11/21 10:06 PM   Specimen: BLOOD  Result Value Ref Range Status   Specimen Description BLOOD RIGHT ANTECUBITAL  Final   Special Requests AEROBIC BOTTLE ONLY Blood Culture adequate volume  Final   Culture   Final    NO GROWTH 5 DAYS Performed at [facility], [address] [place], [place] [address]    Report Status 09/16/2021 FINAL  Final  Culture, blood (Routine x 2)     Status: None   Collection Time: 09/11/21 10:07 PM   Specimen: BLOOD LEFT ARM  Result Value Ref Range Status   Specimen Description BLOOD LEFT ARM  Final   Special Requests   Final    BOTTLES DRAWN AEROBIC AND ANAEROBIC Blood Culture adequate volume   Culture   Final    NO GROWTH 5 DAYS Performed at [facility], [address], [place], [place] [address]    Report Status 09/16/2021 FINAL  Final    Please note: You were cared for by a hospitalist during your hospital stay. Once you are discharged, your primary care physician will handle any further medical issues. Please note that NO REFILLS for any discharge medications will be authorized once you are discharged, as it is imperative that you return to your primary care physician (or establish a relationship with a primary care physician if you do not have one) for your post hospital discharge needs so that they can reassess your need for medications and monitor your lab values.    Time coordinating discharge: 40 minutes  SIGNED:   Burnadette Pop, MD  [facility] 09/16/2021, 9:55 AM Pager [phone]  If 7PM-7AM, please contact night-coverage [email or web address] Password [email or web address]

## 2021-11-04 ENCOUNTER — Other Ambulatory Visit: Payer: Self-pay | Admitting: Nurse Practitioner

## 2021-11-04 DIAGNOSIS — Z1231 Encounter for screening mammogram for malignant neoplasm of breast: Secondary | ICD-10-CM

## 2021-11-04 HISTORY — PX: BREAST BIOPSY: SHX20

## 2021-11-05 ENCOUNTER — Other Ambulatory Visit: Payer: Self-pay | Admitting: Nurse Practitioner

## 2021-11-05 DIAGNOSIS — Z1231 Encounter for screening mammogram for malignant neoplasm of breast: Secondary | ICD-10-CM

## 2021-11-06 DIAGNOSIS — Z1231 Encounter for screening mammogram for malignant neoplasm of breast: Secondary | ICD-10-CM

## 2021-11-07 ENCOUNTER — Ambulatory Visit
Admission: RE | Admit: 2021-11-07 | Discharge: 2021-11-07 | Disposition: A | Discharge status: Home / Self Care | Payer: BC Managed Care – PPO | Source: Ambulatory Visit

## 2021-11-07 DIAGNOSIS — Z1231 Encounter for screening mammogram for malignant neoplasm of breast: Secondary | ICD-10-CM

## 2021-11-08 ENCOUNTER — Other Ambulatory Visit: Payer: Self-pay | Admitting: Nurse Practitioner

## 2021-11-08 DIAGNOSIS — R928 Other abnormal and inconclusive findings on diagnostic imaging of breast: Secondary | ICD-10-CM

## 2021-11-15 ENCOUNTER — Other Ambulatory Visit: Payer: Self-pay | Admitting: Nurse Practitioner

## 2021-11-15 ENCOUNTER — Ambulatory Visit
Admission: RE | Admit: 2021-11-15 | Discharge: 2021-11-15 | Disposition: A | Discharge status: Home / Self Care | Payer: BC Managed Care – PPO | Source: Ambulatory Visit | Attending: Nurse Practitioner | Admitting: Nurse Practitioner

## 2021-11-15 DIAGNOSIS — R928 Other abnormal and inconclusive findings on diagnostic imaging of breast: Secondary | ICD-10-CM

## 2021-11-15 DIAGNOSIS — R921 Mammographic calcification found on diagnostic imaging of breast: Secondary | ICD-10-CM

## 2021-11-22 ENCOUNTER — Ambulatory Visit
Admission: RE | Admit: 2021-11-22 | Discharge: 2021-11-22 | Disposition: A | Discharge status: Home / Self Care | Payer: BC Managed Care – PPO | Source: Ambulatory Visit | Attending: Nurse Practitioner | Admitting: Nurse Practitioner

## 2021-11-22 DIAGNOSIS — R921 Mammographic calcification found on diagnostic imaging of breast: Secondary | ICD-10-CM

## 2022-10-28 ENCOUNTER — Other Ambulatory Visit: Payer: Self-pay | Admitting: Nurse Practitioner

## 2022-10-28 DIAGNOSIS — Z1231 Encounter for screening mammogram for malignant neoplasm of breast: Secondary | ICD-10-CM

## 2022-11-24 DIAGNOSIS — Z1231 Encounter for screening mammogram for malignant neoplasm of breast: Secondary | ICD-10-CM

## 2022-12-09 ENCOUNTER — Ambulatory Visit
Admission: RE | Admit: 2022-12-09 | Discharge: 2022-12-09 | Disposition: A | Discharge status: Home / Self Care | Payer: BC Managed Care – PPO | Source: Ambulatory Visit | Attending: Nurse Practitioner | Admitting: Nurse Practitioner

## 2022-12-09 DIAGNOSIS — Z1231 Encounter for screening mammogram for malignant neoplasm of breast: Secondary | ICD-10-CM

## 2022-12-31 ENCOUNTER — Emergency Department (HOSPITAL_COMMUNITY): Payer: BC Managed Care – PPO

## 2022-12-31 ENCOUNTER — Other Ambulatory Visit: Payer: Self-pay

## 2022-12-31 ENCOUNTER — Encounter (HOSPITAL_COMMUNITY): Payer: Self-pay

## 2022-12-31 ENCOUNTER — Inpatient Hospital Stay (HOSPITAL_COMMUNITY)
Admission: EM | Admit: 2022-12-31 | Discharge: 2023-01-05 | Disposition: A | Discharge status: Home / Self Care | Payer: BC Managed Care – PPO | Attending: Family Medicine | Admitting: Family Medicine

## 2022-12-31 DIAGNOSIS — R531 Weakness: Secondary | ICD-10-CM

## 2022-12-31 DIAGNOSIS — E872 Acidosis, unspecified: Secondary | ICD-10-CM | POA: Diagnosis present

## 2022-12-31 DIAGNOSIS — Z90710 Acquired absence of both cervix and uterus: Secondary | ICD-10-CM

## 2022-12-31 DIAGNOSIS — N10 Acute tubulo-interstitial nephritis: Secondary | ICD-10-CM | POA: Diagnosis present

## 2022-12-31 DIAGNOSIS — Z6841 Body Mass Index (BMI) 40.0 and over, adult: Secondary | ICD-10-CM | POA: Diagnosis not present

## 2022-12-31 DIAGNOSIS — E871 Hypo-osmolality and hyponatremia: Secondary | ICD-10-CM | POA: Diagnosis present

## 2022-12-31 DIAGNOSIS — Z8249 Family history of ischemic heart disease and other diseases of the circulatory system: Secondary | ICD-10-CM | POA: Diagnosis not present

## 2022-12-31 DIAGNOSIS — E8809 Other disorders of plasma-protein metabolism, not elsewhere classified: Secondary | ICD-10-CM | POA: Diagnosis present

## 2022-12-31 DIAGNOSIS — N12 Tubulo-interstitial nephritis, not specified as acute or chronic: Secondary | ICD-10-CM | POA: Diagnosis not present

## 2022-12-31 DIAGNOSIS — R6520 Severe sepsis without septic shock: Secondary | ICD-10-CM | POA: Diagnosis present

## 2022-12-31 DIAGNOSIS — E860 Dehydration: Secondary | ICD-10-CM | POA: Diagnosis present

## 2022-12-31 DIAGNOSIS — K5900 Constipation, unspecified: Secondary | ICD-10-CM | POA: Diagnosis present

## 2022-12-31 DIAGNOSIS — D638 Anemia in other chronic diseases classified elsewhere: Secondary | ICD-10-CM | POA: Diagnosis present

## 2022-12-31 DIAGNOSIS — R7989 Other specified abnormal findings of blood chemistry: Secondary | ICD-10-CM | POA: Diagnosis present

## 2022-12-31 DIAGNOSIS — N179 Acute kidney failure, unspecified: Secondary | ICD-10-CM | POA: Diagnosis present

## 2022-12-31 DIAGNOSIS — I890 Lymphedema, not elsewhere classified: Secondary | ICD-10-CM | POA: Diagnosis present

## 2022-12-31 DIAGNOSIS — N200 Calculus of kidney: Secondary | ICD-10-CM | POA: Diagnosis present

## 2022-12-31 DIAGNOSIS — R7881 Bacteremia: Secondary | ICD-10-CM | POA: Diagnosis not present

## 2022-12-31 DIAGNOSIS — I10 Essential (primary) hypertension: Secondary | ICD-10-CM | POA: Diagnosis present

## 2022-12-31 DIAGNOSIS — Z833 Family history of diabetes mellitus: Secondary | ICD-10-CM | POA: Diagnosis not present

## 2022-12-31 DIAGNOSIS — D509 Iron deficiency anemia, unspecified: Secondary | ICD-10-CM | POA: Diagnosis present

## 2022-12-31 DIAGNOSIS — Z9049 Acquired absence of other specified parts of digestive tract: Secondary | ICD-10-CM

## 2022-12-31 DIAGNOSIS — Z87442 Personal history of urinary calculi: Secondary | ICD-10-CM

## 2022-12-31 DIAGNOSIS — Z79899 Other long term (current) drug therapy: Secondary | ICD-10-CM

## 2022-12-31 DIAGNOSIS — K219 Gastro-esophageal reflux disease without esophagitis: Secondary | ICD-10-CM | POA: Diagnosis present

## 2022-12-31 DIAGNOSIS — A419 Sepsis, unspecified organism: Secondary | ICD-10-CM | POA: Diagnosis not present

## 2022-12-31 DIAGNOSIS — E876 Hypokalemia: Secondary | ICD-10-CM | POA: Diagnosis present

## 2022-12-31 DIAGNOSIS — R109 Unspecified abdominal pain: Secondary | ICD-10-CM | POA: Diagnosis not present

## 2022-12-31 DIAGNOSIS — A4151 Sepsis due to Escherichia coli [E. coli]: Principal | ICD-10-CM | POA: Diagnosis present

## 2022-12-31 LAB — URINALYSIS, ROUTINE W REFLEX MICROSCOPIC
Bilirubin Urine: NEGATIVE
Glucose, UA: NEGATIVE mg/dL
Ketones, ur: 20 mg/dL — AB
Nitrite: NEGATIVE
Protein, ur: 100 mg/dL — AB
Specific Gravity, Urine: 1.035 — ABNORMAL HIGH (ref 1.005–1.030)
pH: 5 (ref 5.0–8.0)

## 2022-12-31 LAB — COMPREHENSIVE METABOLIC PANEL
ALT: 19 U/L (ref 0–44)
AST: 39 U/L (ref 15–41)
Albumin: 3 g/dL — ABNORMAL LOW (ref 3.5–5.0)
Alkaline Phosphatase: 78 U/L (ref 38–126)
Anion gap: 8 (ref 5–15)
BUN: 15 mg/dL (ref 6–20)
CO2: 16 mmol/L — ABNORMAL LOW (ref 22–32)
Calcium: 7.7 mg/dL — ABNORMAL LOW (ref 8.9–10.3)
Chloride: 113 mmol/L — ABNORMAL HIGH (ref 98–111)
Creatinine, Ser: 1.28 mg/dL — ABNORMAL HIGH (ref 0.44–1.00)
GFR, Estimated: 49 mL/min — ABNORMAL LOW (ref >60)
Glucose, Bld: 114 mg/dL — ABNORMAL HIGH (ref 70–99)
Potassium: 3.5 mmol/L (ref 3.5–5.1)
Sodium: 137 mmol/L (ref 135–145)
Total Bilirubin: 2 mg/dL — ABNORMAL HIGH (ref 0.3–1.2)
Total Protein: 6.2 g/dL — ABNORMAL LOW (ref 6.5–8.1)

## 2022-12-31 LAB — CBC
HCT: 35 % — ABNORMAL LOW (ref 36.0–46.0)
Hemoglobin: 11.1 g/dL — ABNORMAL LOW (ref 12.0–15.0)
MCH: 25.1 pg — ABNORMAL LOW (ref 26.0–34.0)
MCHC: 31.7 g/dL (ref 30.0–36.0)
MCV: 79.2 fL — ABNORMAL LOW (ref 80.0–100.0)
Platelets: 203 10*3/uL (ref 150–400)
RBC: 4.42 MIL/uL (ref 3.87–5.11)
RDW: 15.6 % — ABNORMAL HIGH (ref 11.5–15.5)
WBC: 12.9 10*3/uL — ABNORMAL HIGH (ref 4.0–10.5)
nRBC: 0 % (ref 0.0–0.2)

## 2022-12-31 LAB — LACTIC ACID, PLASMA: Lactic Acid, Venous: 2 mmol/L (ref 0.5–1.9)

## 2022-12-31 LAB — LIPASE, BLOOD: Lipase: 23 U/L (ref 11–51)

## 2022-12-31 LAB — GLUCOSE, CAPILLARY: Glucose-Capillary: 102 mg/dL — ABNORMAL HIGH (ref 70–99)

## 2022-12-31 MED ORDER — NALOXONE HCL 0.4 MG/ML IJ SOLN: 0.4000 mg | INTRAMUSCULAR | Status: DC | PRN

## 2022-12-31 MED ORDER — ONDANSETRON HCL 4 MG/2ML IJ SOLN
4.0000 mg | Freq: Four times a day (QID) | INTRAMUSCULAR | Status: DC | PRN
  Administered 2022-12-31 – 2023-01-05 (×5): 4 mg via INTRAVENOUS
  Filled 2022-12-31 (×5): qty 2

## 2022-12-31 MED ORDER — ACETAMINOPHEN 325 MG PO TABS
650.0000 mg | ORAL_TABLET | Freq: Four times a day (QID) | ORAL | Status: DC | PRN
  Administered 2023-01-02: 650 mg via ORAL
  Filled 2022-12-31 (×2): qty 2

## 2022-12-31 MED ORDER — LACTATED RINGERS IV BOLUS
1000.0000 mL | Freq: Once | INTRAVENOUS | Status: AC
  Administered 2022-12-31: 1000 mL via INTRAVENOUS

## 2022-12-31 MED ORDER — MELATONIN 3 MG PO TABS: 3.0000 mg | ORAL_TABLET | Freq: Every evening | ORAL | Status: DC | PRN

## 2022-12-31 MED ORDER — ONDANSETRON HCL 4 MG/2ML IJ SOLN
4.0000 mg | Freq: Once | INTRAMUSCULAR | Status: AC
  Administered 2022-12-31: 4 mg via INTRAVENOUS
  Filled 2022-12-31: qty 2

## 2022-12-31 MED ORDER — ACETAMINOPHEN 650 MG RE SUPP: 650.0000 mg | Freq: Four times a day (QID) | RECTAL | Status: DC | PRN

## 2022-12-31 MED ORDER — IOHEXOL 350 MG/ML SOLN
100.0000 mL | Freq: Once | INTRAVENOUS | Status: AC | PRN
  Administered 2022-12-31: 100 mL via INTRAVENOUS

## 2022-12-31 MED ORDER — MORPHINE SULFATE (PF) 4 MG/ML IV SOLN
3.0000 mg | INTRAVENOUS | Status: DC | PRN
  Administered 2022-12-31: 3 mg via INTRAVENOUS
  Filled 2022-12-31: qty 1

## 2022-12-31 MED ORDER — SODIUM CHLORIDE 0.9 % IV SOLN: 1.0000 g | INTRAVENOUS | Status: DC

## 2022-12-31 MED ORDER — SODIUM CHLORIDE 0.9 % IV SOLN
1.0000 g | Freq: Once | INTRAVENOUS | Status: AC
  Administered 2022-12-31: 1 g via INTRAVENOUS
  Filled 2022-12-31: qty 10

## 2022-12-31 MED ORDER — LACTATED RINGERS IV SOLN: INTRAVENOUS | Status: AC

## 2022-12-31 MED ORDER — MORPHINE SULFATE (PF) 4 MG/ML IV SOLN
4.0000 mg | Freq: Once | INTRAVENOUS | Status: AC
  Administered 2022-12-31: 4 mg via INTRAVENOUS
  Filled 2022-12-31: qty 1

## 2022-12-31 NOTE — H&P (Signed)
History and Physical      Connie Mcneil HYQ:[id number] DOB: 1966-04-21 DOA: 12/31/2022; DOS: 12/31/2022  PCP: Courtney Paris, NP *** Patient coming from: home ***  I have personally briefly reviewed patient's old medical records in [facility]  Chief Complaint: ***  HPI: Connie Mcneil is a 57 y.o. female with medical history significant for *** who is admitted to [facility] on 12/31/2022 with *** after presenting from home*** to [facility] ED complaining of ***.   ***        ***  ED Course:  Vital signs in the ED were notable for the following: ***  Labs were notable for the following: ***  Per my interpretation, EKG in ED demonstrated the following:  ***  Imaging and additional notable ED work-up: ***  While in the ED, the following were administered: ***  Subsequently, the patient was admitted  ***  ***red   Review of Systems: As per HPI otherwise 10 point review of systems negative.   Past Medical History:  Diagnosis Date   Ileitis    10/2013   Obesity     Past Surgical History:  Procedure Laterality Date   APPENDECTOMY     BREAST BIOPSY Right 11/2021   CHOLECYSTECTOMY     TOTAL ABDOMINAL HYSTERECTOMY      Social History:  reports that she has never smoked. She has never used smokeless tobacco. She reports that she does not drink alcohol and does not use drugs.   No Known Allergies  Family History  Problem Relation Age of Onset   Diabetes Mother    Heart disease Mother        chf   Hypertension Mother     Family history reviewed and not pertinent ***   Prior to Admission medications   Medication Sig Start Date End Date Taking? Authorizing Provider  hydrochlorothiazide (HYDRODIURIL) 50 MG tablet Take 50 mg by mouth daily as needed. 11/17/22  Yes [provider]  naproxen sodium (ALEVE) 220 MG tablet Take 220 mg by mouth daily as needed (pain).   Yes [provider]  phentermine 37.5 MG capsule Take 1 capsule  (37.5 mg total) by mouth every morning. 08/13/20  Yes Serena Croissant, MD  topiramate (TOPAMAX) 100 MG tablet Take 1 tablet (100 mg total) by mouth daily. Patient taking differently: Take 100 mg by mouth at bedtime. 08/13/20  Yes Serena Croissant, MD  Vitamin D, Ergocalciferol, (DRISDOL) 1.25 MG (50000 UNIT) CAPS capsule Take 50,000 Units by mouth once a week. 08/02/21  Yes [provider]  methocarbamol (ROBAXIN) 750 MG tablet Take 1 tablet (750 mg total) by mouth every 6 (six) hours as needed for muscle spasms. Patient not taking: Reported on 12/31/2022 09/16/21   Burnadette Pop, MD     Objective    Physical Exam: Vitals:   12/31/22 1948 12/31/22 2057 12/31/22 2130 12/31/22 2200  BP:  (!) 107/51 (!) 95/58 (!) 107/59  Pulse: 76 78 72 72  Resp: 17 13 18 19   Temp:      TempSrc:      SpO2: 99% 99% 100% 100%  Weight:      Height:        General: appears to be stated age; alert, oriented Skin: warm, dry, no rash Head:  AT/[place] Mouth:  Oral mucosa membranes appear moist, normal dentition Neck: supple; trachea midline Heart:  RRR; did not appreciate any M/R/G Lungs: CTAB, did not appreciate any wheezes, rales, or rhonchi Abdomen: + BS;  soft, ND, NT Vascular: 2+ pedal pulses b/l; 2+ radial pulses b/l Extremities: no peripheral edema, no muscle wasting Neuro: strength and sensation intact in upper and lower extremities b/l    *** Neuro: 5/5 strength of the proximal and distal flexors and extensors of the upper and lower extremities bilaterally; sensation intact in upper and lower extremities b/l; cranial nerves II through XII grossly intact; no pronator drift; no evidence suggestive of slurred speech, dysarthria, or facial droop; Normal muscle tone. No tremors. *** Neuro: In the setting of the patient's current mental status and associated inability to follow instructions, unable to perform full neurologic exam at this time.  As such, assessment of strength, sensation, and cranial  nerves is limited at this time. Patient noted to spontaneously move all 4 extremities. No tremors.  ***    Labs on Admission: I have personally reviewed following labs and imaging studies  CBC: Recent Labs  Lab 12/31/22 1820  WBC 12.9*  HGB 11.1*  HCT 35.0*  MCV 79.2*  PLT 203   Basic Metabolic Panel: Recent Labs  Lab 12/31/22 1820  NA 137  K 3.5  CL 113*  CO2 16*  GLUCOSE 114*  BUN 15  CREATININE 1.28*  CALCIUM 7.7*   GFR: Estimated Creatinine Clearance: 54.2 mL/min (A) (by C-G formula based on SCr of 1.28 mg/dL (H)). Liver Function Tests: Recent Labs  Lab 12/31/22 1820  AST 39  ALT 19  ALKPHOS 78  BILITOT 2.0*  PROT 6.2*  ALBUMIN 3.0*   Recent Labs  Lab 12/31/22 1820  LIPASE 23   No results for input(s): "AMMONIA" in the last 168 hours. Coagulation Profile: No results for input(s): "INR", "PROTIME" in the last 168 hours. Cardiac Enzymes: No results for input(s): "CKTOTAL", "CKMB", "CKMBINDEX", "TROPONINI" in the last 168 hours. BNP (last 3 results) No results for input(s): "PROBNP" in the last 8760 hours. HbA1C: No results for input(s): "HGBA1C" in the last 72 hours. CBG: No results for input(s): "GLUCAP" in the last 168 hours. Lipid Profile: No results for input(s): "CHOL", "HDL", "LDLCALC", "TRIG", "CHOLHDL", "LDLDIRECT" in the last 72 hours. Thyroid Function Tests: No results for input(s): "TSH", "T4TOTAL", "FREET4", "T3FREE", "THYROIDAB" in the last 72 hours. Anemia Panel: No results for input(s): "VITAMINB12", "FOLATE", "FERRITIN", "TIBC", "IRON", "RETICCTPCT" in the last 72 hours. Urine analysis:    Component Value Date/Time   COLORURINE AMBER (A) 12/31/2022 2159   APPEARANCEUR HAZY (A) 12/31/2022 2159   LABSPEC 1.035 (H) 12/31/2022 2159   PHURINE 5.0 12/31/2022 2159   GLUCOSEU NEGATIVE 12/31/2022 2159   HGBUR MODERATE (A) 12/31/2022 2159   BILIRUBINUR NEGATIVE 12/31/2022 2159   KETONESUR 20 (A) 12/31/2022 2159   PROTEINUR 100 (A)  12/31/2022 2159   UROBILINOGEN 1.0 08/23/2014 2108   NITRITE NEGATIVE 12/31/2022 2159   LEUKOCYTESUR SMALL (A) 12/31/2022 2159    Radiological Exams on Admission: CT ABDOMEN PELVIS W CONTRAST  Result Date: 12/31/2022 CLINICAL DATA:  Right flank, lower back pain EXAM: CT ABDOMEN AND PELVIS WITH CONTRAST TECHNIQUE: Multidetector CT imaging of the abdomen and pelvis was performed using the standard protocol following bolus administration of intravenous contrast. RADIATION DOSE REDUCTION: This exam was performed according to the departmental dose-optimization program which includes automated exposure control, adjustment of the mA and/or kV according to patient size and/or use of iterative reconstruction technique. CONTRAST:  OMNIPAQUE IOHEXOL 350 MG/ML SOLN COMPARISON:  09/12/2021 FINDINGS: Lower chest: No acute findings Hepatobiliary: Prior cholecystectomy. Mild intrahepatic and extrahepatic biliary ductal prominence compatible with post  cholecystectomy state, stable. Stable 7 mm cyst in the right lobe of the liver, unchanged. Pancreas: No focal abnormality or ductal dilatation. Spleen: No focal abnormality.  Normal size. Adrenals/Urinary Tract: Innumerable bilateral renal cysts, unchanged. No follow-up imaging recommended. Punctate bilateral stones, nonobstructing. No ureteral stones or hydronephrosis. Adrenal glands and urinary bladder unremarkable. Stomach/Bowel: Stomach, large and small bowel grossly unremarkable. Vascular/Lymphatic: No evidence of aneurysm or adenopathy. Reproductive: Prior hysterectomy.  No adnexal masses. Other: No free fluid or free air. Musculoskeletal: No acute bony abnormality. IMPRESSION: 1. Punctate bilateral nephrolithiasis. No ureteral stones or hydronephrosis. 2. No acute findings in the abdomen or pelvis. Electronically Signed   By: Charlett Nose M.D.   On: 12/31/2022 20:43   CT Angio Chest PE W and/or Wo Contrast  Result Date: 12/31/2022 CLINICAL DATA:  Pulmonary  embolism (PE) suspected, high prob EXAM: CT ANGIOGRAPHY CHEST WITH CONTRAST TECHNIQUE: Multidetector CT imaging of the chest was performed using the standard protocol during bolus administration of intravenous contrast. Multiplanar CT image reconstructions and MIPs were obtained to evaluate the vascular anatomy. RADIATION DOSE REDUCTION: This exam was performed according to the departmental dose-optimization program which includes automated exposure control, adjustment of the mA and/or kV according to patient size and/or use of iterative reconstruction technique. CONTRAST:  OMNIPAQUE IOHEXOL 350 MG/ML SOLN COMPARISON:  09/12/2021 FINDINGS: Cardiovascular: No filling defects in the pulmonary arteries to suggest pulmonary emboli. Prominent main pulmonary artery measuring 4 cm with prominent central pulmonary vessels. This suggest pulmonary arterial hypertension. No evidence of aortic aneurysm. Mediastinum/Nodes: No mediastinal, hilar, or axillary adenopathy. Trachea and esophagus are unremarkable. Thyroid unremarkable. Lungs/Pleura: Lungs are clear. No focal airspace opacities or suspicious nodules. No effusions. Upper Abdomen: No acute findings Musculoskeletal: Chest wall soft tissues are unremarkable. No acute bony abnormality. Review of the MIP images confirms the above findings. IMPRESSION: 1. No evidence of pulmonary embolus. 2. Prominent main pulmonary artery and central pulmonary vessels suggest pulmonary arterial hypertension. 3. No acute cardiopulmonary disease. Electronically Signed   By: Charlett Nose M.D.   On: 12/31/2022 20:41      Assessment/Plan    Principal Problem:   Pyelonephritis of right  kidney  ***      ***          ***               ***              ***              ***              ***              ***              ***              ***              ***              ***              ***              ***     DVT prophylaxis: SCD's ***  Code Status: Full code*** Family Communication: none*** Disposition Plan: Per Rounding Team Consults called: none***;  Admission status: ***    I SPENT GREATER THAN 75 *** MINUTES IN CLINICAL CARE TIME/MEDICAL DECISION-MAKING IN COMPLETING THIS ADMISSION.     Angie Fava  DO [facility] From 7PM - 7AM   12/31/2022, 10:48 PM   ***

## 2022-12-31 NOTE — ED Triage Notes (Addendum)
Patient BIB [facility] from urgent care. Has had right lower back/flank pain since Sunday. On scene patient became diaphoretic, short of breath, nauseous and could not stand. Denies burning urination. Was given 4mg  ODT zofran and IV fluids with EMS.

## 2022-12-31 NOTE — ED Provider Notes (Signed)
[place] EMERGENCY DEPARTMENT AT [facility] Provider Note   CSN: [id number] Arrival date & time: 12/31/22  1801     History  Chief Complaint  Patient presents with   Flank Pain    Connie Mcneil is a 57 y.o. female.  57 year old female with history of pyelonephritis and kidney stones who presents to the emergency department with generalized weakness and flank pain.  Patient reports that several days ago she started having generalized weakness.  Also has had some nausea.  Says that yesterday she started experiencing right-sided flank pain.  No fevers at home.  Has had some chills.  Also reports that she is short of breath but denies any cough.  No vomiting or diarrhea recently.  Had a cholecystectomy.  Went to urgent care and they were concerned about her tachypnea and shortness of breath and borderline low blood pressure so they referred her into the emergency department for evaluation.       Home Medications Prior to Admission medications   Medication Sig Start Date End Date Taking? Authorizing Provider  hydrochlorothiazide (HYDRODIURIL) 50 MG tablet Take 50 mg by mouth daily as needed. 11/17/22  Yes [provider]  naproxen sodium (ALEVE) 220 MG tablet Take 220 mg by mouth daily as needed (pain).   Yes [provider]  phentermine 37.5 MG capsule Take 1 capsule (37.5 mg total) by mouth every morning. 08/13/20  Yes Serena Croissant, MD  topiramate (TOPAMAX) 100 MG tablet Take 1 tablet (100 mg total) by mouth daily. Patient taking differently: Take 100 mg by mouth at bedtime. 08/13/20  Yes Serena Croissant, MD  Vitamin D, Ergocalciferol, (DRISDOL) 1.25 MG (50000 UNIT) CAPS capsule Take 50,000 Units by mouth once a week. 08/02/21  Yes [provider]  methocarbamol (ROBAXIN) 750 MG tablet Take 1 tablet (750 mg total) by mouth every 6 (six) hours as needed for muscle spasms. Patient not taking: Reported on 12/31/2022 09/16/21   Burnadette Pop, MD       Allergies    Patient has no known allergies.    Review of Systems   Review of Systems  Physical Exam Updated Vital Signs BP (!) 107/59   Pulse 72   Temp (!) 97.5 F (36.4 C)   Resp 19   Ht 5\' 4"  (1.626 m)   Wt 97.1 kg   SpO2 100%   BMI 36.73 kg/m  Physical Exam Vitals and nursing note reviewed.  Constitutional:      General: She is not in acute distress.    Appearance: She is well-developed.  HENT:     Head: Normocephalic and atraumatic.     Right Ear: External ear normal.     Left Ear: External ear normal.     Nose: Nose normal.  Eyes:     Extraocular Movements: Extraocular movements intact.     Conjunctiva/sclera: Conjunctivae normal.     Pupils: Pupils are equal, round, and reactive to light.  Cardiovascular:     Rate and Rhythm: Normal rate and regular rhythm.     Heart sounds: No murmur heard. Pulmonary:     Effort: Pulmonary effort is normal. No respiratory distress.     Breath sounds: Normal breath sounds.  Abdominal:     General: Abdomen is flat. There is no distension.     Palpations: Abdomen is soft. There is no mass.     Tenderness: There is no abdominal tenderness. There is right CVA tenderness. There is no left CVA tenderness or guarding.  Musculoskeletal:     Cervical back: Normal range of motion and neck supple.     Right lower leg: No edema.     Left lower leg: No edema.  Skin:    General: Skin is warm and dry.  Neurological:     Mental Status: She is alert and oriented to person, place, and time. Mental status is at baseline.  Psychiatric:        Mood and Affect: Mood normal.     ED Results / Procedures / Treatments   Labs (all labs ordered are listed, but only abnormal results are displayed) Labs Reviewed  CBC - Abnormal; Notable for the following components:      Result Value   WBC 12.9 (*)    Hemoglobin 11.1 (*)    HCT 35.0 (*)    MCV 79.2 (*)    MCH 25.1 (*)    RDW 15.6 (*)    All other components within normal limits   URINALYSIS, ROUTINE W REFLEX MICROSCOPIC - Abnormal; Notable for the following components:   Color, Urine AMBER (*)    APPearance HAZY (*)    Specific Gravity, Urine 1.035 (*)    Hgb urine dipstick MODERATE (*)    Ketones, ur 20 (*)    Protein, ur 100 (*)    Leukocytes,Ua SMALL (*)    Bacteria, UA MANY (*)    All other components within normal limits  COMPREHENSIVE METABOLIC PANEL - Abnormal; Notable for the following components:   Chloride 113 (*)    CO2 16 (*)    Glucose, Bld 114 (*)    Creatinine, Ser 1.28 (*)    Calcium 7.7 (*)    Total Protein 6.2 (*)    Albumin 3.0 (*)    Total Bilirubin 2.0 (*)    GFR, Estimated 49 (*)    All other components within normal limits  CULTURE, BLOOD (ROUTINE X 2)  CULTURE, BLOOD (ROUTINE X 2)  LIPASE, BLOOD  LACTIC ACID, PLASMA  LACTIC ACID, PLASMA    EKG EKG Interpretation  Date/Time:  Wednesday December 31 2022 18:13:09 EDT Ventricular Rate:  82 PR Interval:  133 QRS Duration: 99 QT Interval:  389 QTC Calculation: 455 R Axis:   22 Text Interpretation: Sinus rhythm Confirmed by Vonita Moss [phone]) on 12/31/2022 7:10:12 PM  Radiology CT ABDOMEN PELVIS W CONTRAST  Result Date: 12/31/2022 CLINICAL DATA:  Right flank, lower back pain EXAM: CT ABDOMEN AND PELVIS WITH CONTRAST TECHNIQUE: Multidetector CT imaging of the abdomen and pelvis was performed using the standard protocol following bolus administration of intravenous contrast. RADIATION DOSE REDUCTION: This exam was performed according to the departmental dose-optimization program which includes automated exposure control, adjustment of the mA and/or kV according to patient size and/or use of iterative reconstruction technique. CONTRAST:  OMNIPAQUE IOHEXOL 350 MG/ML SOLN COMPARISON:  09/12/2021 FINDINGS: Lower chest: No acute findings Hepatobiliary: Prior cholecystectomy. Mild intrahepatic and extrahepatic biliary ductal prominence compatible with post cholecystectomy state,  stable. Stable 7 mm cyst in the right lobe of the liver, unchanged. Pancreas: No focal abnormality or ductal dilatation. Spleen: No focal abnormality.  Normal size. Adrenals/Urinary Tract: Innumerable bilateral renal cysts, unchanged. No follow-up imaging recommended. Punctate bilateral stones, nonobstructing. No ureteral stones or hydronephrosis. Adrenal glands and urinary bladder unremarkable. Stomach/Bowel: Stomach, large and small bowel grossly unremarkable. Vascular/Lymphatic: No evidence of aneurysm or adenopathy. Reproductive: Prior hysterectomy.  No adnexal masses. Other: No free fluid or free air. Musculoskeletal: No acute bony abnormality. IMPRESSION: 1. Punctate  bilateral nephrolithiasis. No ureteral stones or hydronephrosis. 2. No acute findings in the abdomen or pelvis. Electronically Signed   By: Charlett Nose M.D.   On: 12/31/2022 20:43   CT Angio Chest PE W and/or Wo Contrast  Result Date: 12/31/2022 CLINICAL DATA:  Pulmonary embolism (PE) suspected, high prob EXAM: CT ANGIOGRAPHY CHEST WITH CONTRAST TECHNIQUE: Multidetector CT imaging of the chest was performed using the standard protocol during bolus administration of intravenous contrast. Multiplanar CT image reconstructions and MIPs were obtained to evaluate the vascular anatomy. RADIATION DOSE REDUCTION: This exam was performed according to the departmental dose-optimization program which includes automated exposure control, adjustment of the mA and/or kV according to patient size and/or use of iterative reconstruction technique. CONTRAST:  OMNIPAQUE IOHEXOL 350 MG/ML SOLN COMPARISON:  09/12/2021 FINDINGS: Cardiovascular: No filling defects in the pulmonary arteries to suggest pulmonary emboli. Prominent main pulmonary artery measuring 4 cm with prominent central pulmonary vessels. This suggest pulmonary arterial hypertension. No evidence of aortic aneurysm. Mediastinum/Nodes: No mediastinal, hilar, or axillary adenopathy. Trachea and  esophagus are unremarkable. Thyroid unremarkable. Lungs/Pleura: Lungs are clear. No focal airspace opacities or suspicious nodules. No effusions. Upper Abdomen: No acute findings Musculoskeletal: Chest wall soft tissues are unremarkable. No acute bony abnormality. Review of the MIP images confirms the above findings. IMPRESSION: 1. No evidence of pulmonary embolus. 2. Prominent main pulmonary artery and central pulmonary vessels suggest pulmonary arterial hypertension. 3. No acute cardiopulmonary disease. Electronically Signed   By: Charlett Nose M.D.   On: 12/31/2022 20:41    Procedures Procedures    Medications Ordered in ED Medications  cefTRIAXone (ROCEPHIN) 1 g in sodium chloride 0.9 % 100 mL IVPB (has no administration in time range)  morphine (PF) 4 MG/ML injection 4 mg (4 mg Intravenous Given 12/31/22 1935)  ondansetron (ZOFRAN) injection 4 mg (4 mg Intravenous Given 12/31/22 1935)  lactated ringers bolus 1,000 mL (1,000 mLs Intravenous New Bag/Given 12/31/22 1940)  iohexol (OMNIPAQUE) 350 MG/ML injection 100 mL (100 mLs Intravenous Contrast Given 12/31/22 2013)  lactated ringers bolus 1,000 mL (1,000 mLs Intravenous New Bag/Given 12/31/22 2107)    ED Course/ Medical Decision Making/ A&P Clinical Course as of 12/31/22 2304  Wed Dec 31, 2022  2220 Creatinine(!): 1.28 Baseline of 0.7 [RP]  2237 Dr Dalene Carrow [RP]    Clinical Course User Index [RP] Rondel Baton, MD                             Medical Decision Making Amount and/or Complexity of Data Reviewed Labs: ordered. Decision-making details documented in ED Course. Radiology: ordered.  Risk Prescription drug management. Decision regarding hospitalization.   KIERRIA FEIGENBAUM is a 57 y.o. female with comorbidities that complicate the patient evaluation including kidney stones and bladder fries presents emergency department with right flank pain, shortness of breath, and generalized fatigue  Initial Ddx:  Pyelonephritis,  infected kidney stone, sepsis, pneumonia, PE  MDM/Course:  Concern the patient may have pyelonephritis causing her symptoms.  Afebrile and does not meet sepsis criteria at this time.  Also was considering pneumonia and pulmonary embolism since they are concerned about this urgent care and she is having shortness of breath along with her other systemic symptoms.  She did have a CT scan of the chest, abdomen, and pelvis which not show any acute abnormalities.  She had a urinalysis that was consistent with urinary tract infection.  She was started on IV ceftriaxone  and had blood cultures that were sent.  Patient was also found to have an AKI and was given 2 L of fluids.  Did have some borderline soft blood pressures that responded to fluid.  Upon re-evaluation patient was still feeling weak and given her AKI and infection feel that she would benefit from admission so discussed her with the hospitalist who will admit for her pyelonephritis.  This patient presents to the ED for concern of complaints listed in HPI, this involves an extensive number of treatment options, and is a complaint that carries with it a high risk of complications and morbidity. Disposition including potential need for admission considered.   Dispo: Admit to Floor  Additional history obtained from daughter Records reviewed DC Summary The following labs were independently interpreted: Chemistry and show AKI I independently reviewed the following imaging with scope of interpretation limited to determining acute life threatening conditions related to emergency care: CT Abdomen/Pelvis and agree with the radiologist interpretation with the following exceptions: none I personally reviewed and interpreted cardiac monitoring: normal sinus rhythm  I personally reviewed and interpreted the pt's EKG: see above for interpretation  I have reviewed the patients home medications and made adjustments as needed Consults:  Hospitalist        Final Clinical Impression(s) / ED Diagnoses Final diagnoses:  Pyelonephritis  AKI (acute kidney injury) (HCC)  Dehydration    Rx / DC Orders ED Discharge Orders     None         Rondel Baton, MD 12/31/22 2304

## 2022-12-31 NOTE — ED Notes (Signed)
ED TO INPATIENT HANDOFF REPORT  Name/Age/Gender Connie Mcneil 57 y.o. female  Code Status    Code Status Orders  (From admission, onward)           Start     Ordered   12/31/22 2245  Full code  Continuous       Question:  By:  Answer:  Consent: discussion documented in EHR   12/31/22 2244           Code Status History     Date Active Date Inactive Code Status Order ID Comments User Context   09/12/2021 0431 09/16/2021 1941 Full Code [id number]  Arville Care Vernetta Honey, MD ED   10/07/2013 0927 10/13/2013 2310 Full Code [id number]  Courtney Paris, MD Inpatient       Home/SNF/Other Home  Chief Complaint Pyelonephritis of right kidney [N12]  Level of Care/Admitting Diagnosis ED Disposition     ED Disposition  Admit   Condition  --   Comment  Hospital Area: [facility] [[id number]]  Level of Care: Progressive [102]  Admit to Progressive based on following criteria: MULTISYSTEM THREATS such as stable sepsis, metabolic/electrolyte imbalance with or without encephalopathy that is responding to early treatment.  May admit patient to [facility] or [facility] if equivalent level of care is available:: No  Covid Evaluation: Asymptomatic - no recent exposure (last 10 days) testing not required  Diagnosis: Pyelonephritis of right kidney [6644034]  Admitting Physician: Angie Fava [[id number]]  Attending Physician: Angie Fava [[id number]]  Certification:: I certify this patient will need inpatient services for at least 2 midnights  Estimated Length of Stay: 2          Medical History Past Medical History:  Diagnosis Date   Ileitis    10/2013   Obesity     Allergies No Known Allergies  IV Location/Drains/Wounds Patient Lines/Drains/Airways Status     Active Line/Drains/Airways     Name Placement date Placement time Site Days   Peripheral IV 12/31/22 18 G Left Antecubital 12/31/22  1812  Antecubital  less than 1             Labs/Imaging Results for orders placed or performed during the hospital encounter of 12/31/22 (from the past 48 hour(s))  CBC     Status: Abnormal   Collection Time: 12/31/22  6:20 PM  Result Value Ref Range   WBC 12.9 (H) 4.0 - 10.5 K/uL   RBC 4.42 3.87 - 5.11 MIL/uL   Hemoglobin 11.1 (L) 12.0 - 15.0 g/dL   HCT 32.9 (L) 51.8 - 84.1 %   MCV 79.2 (L) 80.0 - 100.0 fL   MCH 25.1 (L) 26.0 - 34.0 pg   MCHC 31.7 30.0 - 36.0 g/dL   RDW 66.0 (H) 63.0 - 16.0 %   Platelets 203 150 - 400 K/uL   nRBC 0.0 0.0 - 0.2 %    Comment: Performed at [facility], [address] [place], [place] [address]  Comprehensive metabolic panel     Status: Abnormal   Collection Time: 12/31/22  6:20 PM  Result Value Ref Range   Sodium 137 135 - 145 mmol/L   Potassium 3.5 3.5 - 5.1 mmol/L    Comment: HEMOLYSIS AT THIS LEVEL MAY AFFECT RESULT SLIGHT HEMOLYSIS PRESENT    Chloride 113 (H) 98 - 111 mmol/L   CO2 16 (L) 22 - 32 mmol/L   Glucose, Bld 114 (H) 70 - 99 mg/dL    Comment: Glucose  reference range applies only to samples taken after fasting for at least 8 hours.   BUN 15 6 - 20 mg/dL   Creatinine, Ser 1.61 (H) 0.44 - 1.00 mg/dL   Calcium 7.7 (L) 8.9 - 10.3 mg/dL   Total Protein 6.2 (L) 6.5 - 8.1 g/dL   Albumin 3.0 (L) 3.5 - 5.0 g/dL   AST 39 15 - 41 U/L    Comment: HEMOLYSIS AT THIS LEVEL MAY AFFECT RESULT SLIGHT HEMOLYSIS PRESENT    ALT 19 0 - 44 U/L    Comment: HEMOLYSIS AT THIS LEVEL MAY AFFECT RESULT SLIGHT HEMOLYSIS PRESENT    Alkaline Phosphatase 78 38 - 126 U/L   Total Bilirubin 2.0 (H) 0.3 - 1.2 mg/dL    Comment: HEMOLYSIS AT THIS LEVEL MAY AFFECT RESULT SLIGHT HEMOLYSIS PRESENT    GFR, Estimated 49 (L) >60 mL/min    Comment: (NOTE) Calculated using the CKD-EPI Creatinine Equation (2021)    Anion gap 8 5 - 15    Comment: Performed at [facility], [address] [place], [place] [address]  Lipase, blood     Status: None   Collection Time:  12/31/22  6:20 PM  Result Value Ref Range   Lipase 23 11 - 51 U/L    Comment: Performed at [facility], [address] [place], [place] [address]  Urinalysis, Routine w reflex microscopic -Urine, Clean Catch     Status: Abnormal   Collection Time: 12/31/22  9:59 PM  Result Value Ref Range   Color, Urine AMBER (A) YELLOW    Comment: BIOCHEMICALS MAY BE AFFECTED BY COLOR   APPearance HAZY (A) CLEAR   Specific Gravity, Urine 1.035 (H) 1.005 - 1.030   pH 5.0 5.0 - 8.0   Glucose, UA NEGATIVE NEGATIVE mg/dL   Hgb urine dipstick MODERATE (A) NEGATIVE   Bilirubin Urine NEGATIVE NEGATIVE   Ketones, ur 20 (A) NEGATIVE mg/dL   Protein, ur 119 (A) NEGATIVE mg/dL   Nitrite NEGATIVE NEGATIVE   Leukocytes,Ua SMALL (A) NEGATIVE   RBC / HPF 21-50 0 - 5 RBC/hpf   WBC, UA 21-50 0 - 5 WBC/hpf   Bacteria, UA MANY (A) NONE SEEN   Squamous Epithelial / HPF 0-5 0 - 5 /HPF   Mucus PRESENT    Hyaline Casts, UA PRESENT     Comment: Performed at [facility], [address], [place], [place] [address]   CT ABDOMEN PELVIS W CONTRAST  Result Date: 12/31/2022 CLINICAL DATA:  Right flank, lower back pain EXAM: CT ABDOMEN AND PELVIS WITH CONTRAST TECHNIQUE: Multidetector CT imaging of the abdomen and pelvis was performed using the standard protocol following bolus administration of intravenous contrast. RADIATION DOSE REDUCTION: This exam was performed according to the departmental dose-optimization program which includes automated exposure control, adjustment of the mA and/or kV according to patient size and/or use of iterative reconstruction technique. CONTRAST:  OMNIPAQUE IOHEXOL 350 MG/ML SOLN COMPARISON:  09/12/2021 FINDINGS: Lower chest: No acute findings Hepatobiliary: Prior cholecystectomy. Mild intrahepatic and extrahepatic biliary ductal prominence compatible with post cholecystectomy state, stable. Stable 7 mm cyst in the right lobe of the liver, unchanged.  Pancreas: No focal abnormality or ductal dilatation. Spleen: No focal abnormality.  Normal size. Adrenals/Urinary Tract: Innumerable bilateral renal cysts, unchanged. No follow-up imaging recommended. Punctate bilateral stones, nonobstructing. No ureteral stones or hydronephrosis. Adrenal glands and urinary bladder unremarkable. Stomach/Bowel: Stomach, large and small bowel grossly unremarkable. Vascular/Lymphatic: No evidence of aneurysm or adenopathy. Reproductive:  Prior hysterectomy.  No adnexal masses. Other: No free fluid or free air. Musculoskeletal: No acute bony abnormality. IMPRESSION: 1. Punctate bilateral nephrolithiasis. No ureteral stones or hydronephrosis. 2. No acute findings in the abdomen or pelvis. Electronically Signed   By: Charlett Nose M.D.   On: 12/31/2022 20:43   CT Angio Chest PE W and/or Wo Contrast  Result Date: 12/31/2022 CLINICAL DATA:  Pulmonary embolism (PE) suspected, high prob EXAM: CT ANGIOGRAPHY CHEST WITH CONTRAST TECHNIQUE: Multidetector CT imaging of the chest was performed using the standard protocol during bolus administration of intravenous contrast. Multiplanar CT image reconstructions and MIPs were obtained to evaluate the vascular anatomy. RADIATION DOSE REDUCTION: This exam was performed according to the departmental dose-optimization program which includes automated exposure control, adjustment of the mA and/or kV according to patient size and/or use of iterative reconstruction technique. CONTRAST:  OMNIPAQUE IOHEXOL 350 MG/ML SOLN COMPARISON:  09/12/2021 FINDINGS: Cardiovascular: No filling defects in the pulmonary arteries to suggest pulmonary emboli. Prominent main pulmonary artery measuring 4 cm with prominent central pulmonary vessels. This suggest pulmonary arterial hypertension. No evidence of aortic aneurysm. Mediastinum/Nodes: No mediastinal, hilar, or axillary adenopathy. Trachea and esophagus are unremarkable. Thyroid unremarkable. Lungs/Pleura: Lungs  are clear. No focal airspace opacities or suspicious nodules. No effusions. Upper Abdomen: No acute findings Musculoskeletal: Chest wall soft tissues are unremarkable. No acute bony abnormality. Review of the MIP images confirms the above findings. IMPRESSION: 1. No evidence of pulmonary embolus. 2. Prominent main pulmonary artery and central pulmonary vessels suggest pulmonary arterial hypertension. 3. No acute cardiopulmonary disease. Electronically Signed   By: Charlett Nose M.D.   On: 12/31/2022 20:41    Pending Labs Unresulted Labs (From admission, onward)     Start     Ordered   01/01/23 0500  CBC with Differential/Platelet  Tomorrow morning,   R        12/31/22 2244   01/01/23 0500  Comprehensive metabolic panel  Tomorrow morning,   R        12/31/22 2244   01/01/23 0500  Magnesium  Tomorrow morning,   R        12/31/22 2244   12/31/22 2245  Magnesium  Add-on,   AD        12/31/22 2244   12/31/22 2216  Blood culture (routine x 2)  BLOOD CULTURE X 2,   R      12/31/22 2216   12/31/22 2216  Lactic acid, plasma  Now then every 2 hours,   R      12/31/22 2216            Vitals/Pain Today's Vitals   12/31/22 2057 12/31/22 2106 12/31/22 2130 12/31/22 2200  BP: (!) 107/51  (!) 95/58 (!) 107/59  Pulse: 78  72 72  Resp: 13  18 19   Temp:      TempSrc:      SpO2: 99%  100% 100%  Weight:      Height:      PainSc:  8       Isolation Precautions No active isolations  Medications Medications  cefTRIAXone (ROCEPHIN) 1 g in sodium chloride 0.9 % 100 mL IVPB (has no administration in time range)  acetaminophen (TYLENOL) tablet 650 mg (has no administration in time range)    Or  acetaminophen (TYLENOL) suppository 650 mg (has no administration in time range)  melatonin tablet 3 mg (has no administration in time range)  ondansetron (ZOFRAN) injection 4 mg (has no administration  in time range)  lactated ringers bolus 1,000 mL (has no administration in time range)  lactated ringers  infusion (has no administration in time range)  cefTRIAXone (ROCEPHIN) 1 g in sodium chloride 0.9 % 100 mL IVPB (has no administration in time range)  naloxone [facility]) injection 0.4 mg (has no administration in time range)  morphine (PF) 4 MG/ML injection 3 mg (has no administration in time range)  morphine (PF) 4 MG/ML injection 4 mg (4 mg Intravenous Given 12/31/22 1935)  ondansetron (ZOFRAN) injection 4 mg (4 mg Intravenous Given 12/31/22 1935)  lactated ringers bolus 1,000 mL (1,000 mLs Intravenous New Bag/Given 12/31/22 1940)  iohexol (OMNIPAQUE) 350 MG/ML injection 100 mL (100 mLs Intravenous Contrast Given 12/31/22 2013)  lactated ringers bolus 1,000 mL (1,000 mLs Intravenous New Bag/Given 12/31/22 2107)    Mobility walks with person assist

## 2023-01-01 ENCOUNTER — Inpatient Hospital Stay (HOSPITAL_COMMUNITY): Payer: BC Managed Care – PPO

## 2023-01-01 ENCOUNTER — Encounter (HOSPITAL_COMMUNITY): Payer: Self-pay | Admitting: Internal Medicine

## 2023-01-01 DIAGNOSIS — N12 Tubulo-interstitial nephritis, not specified as acute or chronic: Secondary | ICD-10-CM | POA: Diagnosis not present

## 2023-01-01 DIAGNOSIS — R531 Weakness: Secondary | ICD-10-CM

## 2023-01-01 DIAGNOSIS — N179 Acute kidney failure, unspecified: Secondary | ICD-10-CM | POA: Diagnosis present

## 2023-01-01 DIAGNOSIS — D638 Anemia in other chronic diseases classified elsewhere: Secondary | ICD-10-CM | POA: Diagnosis present

## 2023-01-01 DIAGNOSIS — I10 Essential (primary) hypertension: Secondary | ICD-10-CM

## 2023-01-01 DIAGNOSIS — R109 Unspecified abdominal pain: Secondary | ICD-10-CM | POA: Diagnosis present

## 2023-01-01 HISTORY — DX: Essential (primary) hypertension: I10

## 2023-01-01 LAB — MAGNESIUM: Magnesium: 1.9 mg/dL (ref 1.7–2.4)

## 2023-01-01 LAB — COMPREHENSIVE METABOLIC PANEL
ALT: 21 U/L (ref 0–44)
AST: 28 U/L (ref 15–41)
Albumin: 2.6 g/dL — ABNORMAL LOW (ref 3.5–5.0)
Alkaline Phosphatase: 68 U/L (ref 38–126)
Anion gap: 9 (ref 5–15)
BUN: 15 mg/dL (ref 6–20)
CO2: 18 mmol/L — ABNORMAL LOW (ref 22–32)
Calcium: 8.1 mg/dL — ABNORMAL LOW (ref 8.9–10.3)
Chloride: 111 mmol/L (ref 98–111)
Creatinine, Ser: 1.03 mg/dL — ABNORMAL HIGH (ref 0.44–1.00)
GFR, Estimated: >60 mL/min (ref >60)
Glucose, Bld: 89 mg/dL (ref 70–99)
Potassium: 3.3 mmol/L — ABNORMAL LOW (ref 3.5–5.1)
Sodium: 138 mmol/L (ref 135–145)
Total Bilirubin: 1 mg/dL (ref 0.3–1.2)
Total Protein: 5.6 g/dL — ABNORMAL LOW (ref 6.5–8.1)

## 2023-01-01 LAB — CBC WITH DIFFERENTIAL/PLATELET
Abs Immature Granulocytes: 0.04 10*3/uL (ref 0.00–0.07)
Basophils Absolute: 0 10*3/uL (ref 0.0–0.1)
Basophils Relative: 0 %
Eosinophils Absolute: 0 10*3/uL (ref 0.0–0.5)
Eosinophils Relative: 0 %
HCT: 29.1 % — ABNORMAL LOW (ref 36.0–46.0)
Hemoglobin: 9.3 g/dL — ABNORMAL LOW (ref 12.0–15.0)
Immature Granulocytes: 0 %
Lymphocytes Relative: 11 %
Lymphs Abs: 1 10*3/uL (ref 0.7–4.0)
MCH: 24.9 pg — ABNORMAL LOW (ref 26.0–34.0)
MCHC: 32 g/dL (ref 30.0–36.0)
MCV: 78 fL — ABNORMAL LOW (ref 80.0–100.0)
Monocytes Absolute: 0.5 10*3/uL (ref 0.1–1.0)
Monocytes Relative: 6 %
Neutro Abs: 7.6 10*3/uL (ref 1.7–7.7)
Neutrophils Relative %: 83 %
Platelets: 183 10*3/uL (ref 150–400)
RBC: 3.73 MIL/uL — ABNORMAL LOW (ref 3.87–5.11)
RDW: 15.7 % — ABNORMAL HIGH (ref 11.5–15.5)
WBC: 9.3 10*3/uL (ref 4.0–10.5)
nRBC: 0 % (ref 0.0–0.2)

## 2023-01-01 LAB — PROTIME-INR
INR: 1.3 — ABNORMAL HIGH (ref 0.8–1.2)
Prothrombin Time: 16.4 seconds — ABNORMAL HIGH (ref 11.4–15.2)

## 2023-01-01 LAB — CULTURE, BLOOD (ROUTINE X 2)

## 2023-01-01 LAB — VITAMIN B12: Vitamin B-12: 1031 pg/mL — ABNORMAL HIGH (ref 180–914)

## 2023-01-01 LAB — LACTIC ACID, PLASMA
Lactic Acid, Venous: 0.7 mmol/L (ref 0.5–1.9)
Lactic Acid, Venous: 0.9 mmol/L (ref 0.5–1.9)
Lactic Acid, Venous: 3.6 mmol/L (ref 0.5–1.9)

## 2023-01-01 LAB — SODIUM, URINE, RANDOM: Sodium, Ur: 54 mmol/L

## 2023-01-01 LAB — CREATININE, URINE, RANDOM: Creatinine, Urine: 284 mg/dL

## 2023-01-01 LAB — TSH: TSH: 1.06 u[IU]/mL (ref 0.350–4.500)

## 2023-01-01 MED ORDER — LIDOCAINE 5 % EX PTCH
1.0000 | MEDICATED_PATCH | CUTANEOUS | Status: DC
  Administered 2023-01-01 – 2023-01-05 (×5): 1 via TRANSDERMAL
  Filled 2023-01-01 (×5): qty 1

## 2023-01-01 MED ORDER — LACTATED RINGERS IV SOLN: INTRAVENOUS | Status: DC

## 2023-01-01 MED ORDER — POTASSIUM CHLORIDE CRYS ER 20 MEQ PO TBCR
40.0000 meq | EXTENDED_RELEASE_TABLET | Freq: Once | ORAL | Status: AC
  Administered 2023-01-01: 40 meq via ORAL
  Filled 2023-01-01: qty 2

## 2023-01-01 MED ORDER — SODIUM CHLORIDE 0.9 % IV SOLN
2.0000 g | INTRAVENOUS | Status: DC
  Administered 2023-01-01: 2 g via INTRAVENOUS
  Filled 2023-01-01: qty 20

## 2023-01-01 MED ORDER — ALBUTEROL SULFATE (2.5 MG/3ML) 0.083% IN NEBU: 2.5000 mg | INHALATION_SOLUTION | RESPIRATORY_TRACT | Status: DC | PRN

## 2023-01-01 MED ORDER — OXYCODONE HCL 5 MG PO TABS
5.0000 mg | ORAL_TABLET | Freq: Four times a day (QID) | ORAL | Status: DC | PRN
  Administered 2023-01-01 – 2023-01-02 (×2): 5 mg via ORAL
  Filled 2023-01-01 (×2): qty 1

## 2023-01-01 MED ORDER — SODIUM CHLORIDE 0.9 % IV BOLUS
500.0000 mL | Freq: Once | INTRAVENOUS | Status: AC
  Administered 2023-01-01: 500 mL via INTRAVENOUS

## 2023-01-01 MED ORDER — FENTANYL CITRATE PF 50 MCG/ML IJ SOSY
25.0000 ug | PREFILLED_SYRINGE | INTRAMUSCULAR | Status: DC | PRN
  Administered 2023-01-01 (×2): 25 ug via INTRAVENOUS
  Filled 2023-01-01 (×2): qty 1

## 2023-01-01 MED ORDER — HYDROMORPHONE HCL 1 MG/ML IJ SOLN
0.5000 mg | INTRAMUSCULAR | Status: DC | PRN
  Administered 2023-01-01 – 2023-01-02 (×4): 0.5 mg via INTRAVENOUS
  Filled 2023-01-01 (×4): qty 0.5

## 2023-01-01 MED ORDER — PANTOPRAZOLE SODIUM 40 MG PO TBEC
40.0000 mg | DELAYED_RELEASE_TABLET | Freq: Every day | ORAL | Status: DC
  Administered 2023-01-01 – 2023-01-05 (×5): 40 mg via ORAL
  Filled 2023-01-01 (×5): qty 1

## 2023-01-01 MED ORDER — METHOCARBAMOL 500 MG PO TABS
500.0000 mg | ORAL_TABLET | Freq: Four times a day (QID) | ORAL | Status: DC | PRN
  Administered 2023-01-01 – 2023-01-05 (×5): 500 mg via ORAL
  Filled 2023-01-01 (×5): qty 1

## 2023-01-01 NOTE — Evaluation (Signed)
Physical Therapy Evaluation Patient Details Name: Connie Mcneil MRN: [id number] DOB: July 20, 1965 Today's Date: 01/01/2023  History of Present Illness  57 y.o. female with medical history significant for pyelonephritis in 2023, essential pretension, anemia of chronic disease associated baseline hemoglobin 9-12, who is admitted to [facility] on 12/31/2022 with severe sepsis due to right-sided pyelonephritis  Clinical Impression  Pt admitted with above diagnosis.  Pt currently with functional limitations due to the deficits listed below (see PT Problem List). Pt will benefit from acute skilled PT to increase their independence and safety with mobility to allow discharge.  Pt agreeable to mobilize however reports overall fatigue, generalized weakness, and back pain.  Pt lives with her daughter (who is a CSW at [employer]) and currently is working.  Anticipate pt to progress well as she progresses medically.      Recommendations for follow up therapy are one component of a multi-disciplinary discharge planning process, led by the attending physician.  Recommendations may be updated based on patient status, additional functional criteria and insurance authorization.  Follow Up Recommendations       Assistance Recommended at Discharge PRN  Patient can return home with the following       Equipment Recommendations None recommended by PT  Recommendations for Other Services       Functional Status Assessment Patient has had a recent decline in their functional status and demonstrates the ability to make significant improvements in function in a reasonable and predictable amount of time.     Precautions / Restrictions Precautions Precautions: Fall      Mobility  Bed Mobility Overal bed mobility: Modified Independent                  Transfers Overall transfer level: Needs assistance Equipment used: None Transfers: Sit to/from Stand Sit to Stand: Min guard            General transfer comment: min/guard for safety    Ambulation/Gait Ambulation/Gait assistance: Min guard Gait Distance (Feet): 80 Feet Assistive device: IV Pole Gait Pattern/deviations: Step-through pattern, Decreased stride length Gait velocity: decr     General Gait Details: slow but steady, pushed IV pole, reports mild dizziness but mostly back pain limiting  Stairs            Wheelchair Mobility    Modified Rankin (Stroke Patients Only)       Balance Overall balance assessment: Needs assistance         Standing balance support: No upper extremity supported Standing balance-Leahy Scale: Fair                               Pertinent Vitals/Pain Pain Assessment Pain Assessment: Faces Faces Pain Scale: Hurts little more Pain Location: back Pain Descriptors / Indicators: Sore, Discomfort Pain Intervention(s): Monitored during session, Repositioned (RN reports pt has lidocaine patch)    Home Living Family/patient expects to be discharged to:: Private residence Living Arrangements: Children Available Help at Discharge: Family Type of Home: House Home Access: Level entry     Alternate Level Stairs-Number of Steps: flight Home Layout: Two level Home Equipment: None      Prior Function Prior Level of Function : Independent/Modified Independent;Working/employed                     Hand Dominance        Extremity/Trunk Assessment        Lower Extremity  Assessment Lower Extremity Assessment: Generalized weakness    Cervical / Trunk Assessment Cervical / Trunk Assessment: Normal  Communication   Communication: No difficulties  Cognition Arousal/Alertness: Awake/alert Behavior During Therapy: WFL for tasks assessed/performed Overall Cognitive Status: Within Functional Limits for tasks assessed                                          General Comments      Exercises     Assessment/Plan    PT  Assessment Patient needs continued PT services  PT Problem List Decreased strength;Decreased activity tolerance;Decreased mobility;Decreased knowledge of use of DME       PT Treatment Interventions DME instruction;Gait training;Balance training;Therapeutic exercise;Functional mobility training;Therapeutic activities;Patient/family education    PT Goals (Current goals can be found in the Care Plan section)  Acute Rehab PT Goals PT Goal Formulation: With patient Time For Goal Achievement: 01/15/23 Potential to Achieve Goals: Good    Frequency Min 1X/week     Co-evaluation               AM-PAC PT "6 Clicks" Mobility  Outcome Measure Help needed turning from your back to your side while in a flat bed without using bedrails?: A Little Help needed moving from lying on your back to sitting on the side of a flat bed without using bedrails?: A Little Help needed moving to and from a bed to a chair (including a wheelchair)?: A Little Help needed standing up from a chair using your arms (e.g., wheelchair or bedside chair)?: A Little Help needed to walk in hospital room?: A Little Help needed climbing 3-5 steps with a railing? : A Little 6 Click Score: 18    End of Session Equipment Utilized During Treatment: Gait belt Activity Tolerance: Patient tolerated treatment well Patient left: in chair;with call bell/phone within reach;with chair alarm set Nurse Communication: Mobility status PT Visit Diagnosis: Difficulty in walking, not elsewhere classified (R26.2)    Time: 2130-8657 PT Time Calculation (min) (ACUTE ONLY): 18 min   Charges:   PT Evaluation $PT Eval Low Complexity: 1 Low         Kati PT, DPT Physical Therapist Acute Rehabilitation Services Office: [phone]   Janan Halter Payson 01/01/2023, 3:29 PM

## 2023-01-01 NOTE — Progress Notes (Signed)
Called by bedside RN to come assess patient for rapid response due to excessive shivering. Notified admitting MD. When arrived to bedside, on call provider present as requested by admitting MD. Bedside RN continuing to provide sepsis-related interventions as ordered including fluid bolus and symptom management for pain and nausea. Patient reports reduction in shivering and pain. Will continue to monitor response to these interventions.

## 2023-01-01 NOTE — Progress Notes (Signed)
PROGRESS NOTE   Connie Mcneil  ZOX:[id number]    DOB: 1965-07-15    DOA: 12/31/2022  PCP: Courtney Paris, NP   I have briefly reviewed patients previous medical records in [facility].  Chief Complaint  Patient presents with   Flank Pain    Brief Narrative:  57 year old female with PMH of GERD, obesity, pyelonephritis (09/16/2021), anemia of chronic disease, presented to the ED with 3 days history of subacute onset of right flank pain, intermittent nausea, subjective fever and chills but no dysuria.  Report symptoms similar to prior episode of pyelonephritis.  Seen at urgent care, noted to have soft blood pressures, advised ED visit.  Admitted for sepsis due to suspected acute right pyelonephritis.  Sepsis ruled out.   Assessment & Plan:  Principal Problem:   Pyelonephritis of right kidney Active Problems:   Severe sepsis (HCC)   AKI (acute kidney injury) (HCC)   Right flank pain   Generalized weakness   Essential hypertension   Anemia of chronic disease   Sepsis due to suspected acute right pyelonephritis: Met criteria by sepsis 3 guidelines.  Sofa score of 3 (creatinine 1.28, total bilirubin 2). Urine microscopy showed many bacteria and 21-50 WBCs per high-power field. Despite urine microscopy suggestive of UTI, interestingly patient did not have any urine or symptoms but presented with right flank pain similar to prior episodes of pyelonephritis.  Moreover CT abdomen without features of pyelonephritis.  Showed nonobstructive nephrolithiasis.  CTA chest negative for PE. DD: Musculoskeletal pain versus others. Elevated lactate, peaked to 3.6 which has since resolved. S/p 3.5 l IV fluid bolus, continued on maintenance IV fluids while still having nausea with poor oral intake. Blood cultures x 2: Negative to date.  Urine culture pending. Increased ceftriaxone to 2 g daily. Multimodality pain control: Added lidocaine patch and as needed Robaxin if musculoskeletal in  etiology.  Acute kidney injury: Most recent creatinine in March 2023 was in the 0.6-0.8 range.  Presented with creatinine of 1.28 which is more than 0.3 from baseline.  Treated with IV fluids and creatinine has improved to 1.03. Continue to hold HCTZ.  Avoid nephrotoxics. Continue gentle IV fluids and trend daily BMP.  Microcytic anemia: Initially appears to have come and hemoconcentrated with hemoglobin of 11.1.  Post hydration, this has dropped to 9.3 which is her baseline from 2023.  B12 1031.  Hypokalemia: Replace and follow.  Magnesium 1.9.  GERD: PPI initiated.  Body mass index is 42.61 kg/m./Morbid obesity: Outpatient follow-up.   ACP Documents: None present DVT prophylaxis: SCDs Start: 12/31/22 2245     Code Status: Full Code:  Family Communication: None at bedside Disposition:  Status is: Inpatient Remains inpatient appropriate because: IV antibiotics.  Possible DC in 24 to 48 hours.     Consultants:     Procedures:     Antimicrobials:   IV ceftriaxone.   Subjective:  Seen this morning along with patient's female nurse at bedside.  Reports subacute onset of right flank to groin pain over the weekend.  Denies lifting anything heavy or blunt trauma.  Symptoms similar to prior episode of pyelonephritis.  Pain slightly better but still present.  Nausea without vomiting.  No diarrhea.  Objective:   Vitals:   01/01/23 0500 01/01/23 0547 01/01/23 0549 01/01/23 1248  BP:  104/62 104/62 (!) 97/59  Pulse:  72 73 75  Resp:    16  Temp:  98 F (36.7 C) 98 F (36.7 C) 98.3 F (36.8 C)  TempSrc:  Oral Oral Oral  SpO2:  97% 96% 100%  Weight: 112.6 kg     Height:        General exam: Young female, moderately built and obese, slightly ill looking but not toxic and otherwise in no obvious distress.  Oral mucosa with borderline hydration. Respiratory system: Clear to auscultation. Respiratory effort normal. Cardiovascular system: S1 & S2 heard, RRR. No JVD,  murmurs, rubs, gallops or clicks. No pedal edema.  Telemetry personally reviewed: Sinus rhythm. Gastrointestinal system: Abdomen is nondistended, soft and nontender. No organomegaly or masses felt. Normal bowel sounds heard. GU: Right CVA tenderness. Central nervous system: Alert and oriented. No focal neurological deficits. Extremities: Symmetric 5 x 5 power. Skin: No rashes, lesions or ulcers Psychiatry: Judgement and insight appear normal. Mood & affect appropriate.     Data Reviewed:   I have personally reviewed following labs and imaging studies   CBC: Recent Labs  Lab 12/31/22 1820 01/01/23 0423  WBC 12.9* 9.3  NEUTROABS  --  7.6  HGB 11.1* 9.3*  HCT 35.0* 29.1*  MCV 79.2* 78.0*  PLT 203 183    Basic Metabolic Panel: Recent Labs  Lab 12/31/22 1820 01/01/23 0423  NA 137 138  K 3.5 3.3*  CL 113* 111  CO2 16* 18*  GLUCOSE 114* 89  BUN 15 15  CREATININE 1.28* 1.03*  CALCIUM 7.7* 8.1*  MG  --  1.9    Liver Function Tests: Recent Labs  Lab 12/31/22 1820 01/01/23 0423  AST 39 28  ALT 19 21  ALKPHOS 78 68  BILITOT 2.0* 1.0  PROT 6.2* 5.6*  ALBUMIN 3.0* 2.6*    CBG: Recent Labs  Lab 12/31/22 2357  GLUCAP 102*    Microbiology Studies:   Recent Results (from the past 240 hour(s))  Blood culture (routine x 2)     Status: None (Preliminary result)   Collection Time: 12/31/22 11:00 PM   Specimen: BLOOD  Result Value Ref Range Status   Specimen Description   Final    BLOOD SITE NOT SPECIFIED Performed at [facility], [address], [place], [place] [address]    Special Requests   Final    BOTTLES DRAWN AEROBIC ONLY Blood Culture adequate volume Performed at [facility], [address], [place], [place] [address]    Culture   Final    NO GROWTH < 12 HOURS Performed at [facility], [address] [place], [place] [address]    Report Status PENDING  Incomplete  Blood culture (routine x 2)      Status: None (Preliminary result)   Collection Time: 12/31/22 11:00 PM   Specimen: BLOOD  Result Value Ref Range Status   Specimen Description   Final    BLOOD SITE NOT SPECIFIED Performed at [facility], [address], [place], [place] [address]    Special Requests   Final    BOTTLES DRAWN AEROBIC AND ANAEROBIC Blood Culture adequate volume Performed at [facility], [address], [place], [place] [address]    Culture   Final    NO GROWTH < 12 HOURS Performed at [facility], [address] [place], [place] [address]    Report Status PENDING  Incomplete    Radiology Studies:  CT ABDOMEN PELVIS W CONTRAST  Result Date: 12/31/2022 CLINICAL DATA:  Right flank, lower back pain EXAM: CT ABDOMEN AND PELVIS WITH CONTRAST TECHNIQUE: Multidetector CT imaging of the abdomen  and pelvis was performed using the standard protocol following bolus administration of intravenous contrast. RADIATION DOSE REDUCTION: This exam was performed according to the departmental dose-optimization program which includes automated exposure control, adjustment of the mA and/or kV according to patient size and/or use of iterative reconstruction technique. CONTRAST:  OMNIPAQUE IOHEXOL 350 MG/ML SOLN COMPARISON:  09/12/2021 FINDINGS: Lower chest: No acute findings Hepatobiliary: Prior cholecystectomy. Mild intrahepatic and extrahepatic biliary ductal prominence compatible with post cholecystectomy state, stable. Stable 7 mm cyst in the right lobe of the liver, unchanged. Pancreas: No focal abnormality or ductal dilatation. Spleen: No focal abnormality.  Normal size. Adrenals/Urinary Tract: Innumerable bilateral renal cysts, unchanged. No follow-up imaging recommended. Punctate bilateral stones, nonobstructing. No ureteral stones or hydronephrosis. Adrenal glands and urinary bladder unremarkable. Stomach/Bowel: Stomach, large and small bowel grossly unremarkable.  Vascular/Lymphatic: No evidence of aneurysm or adenopathy. Reproductive: Prior hysterectomy.  No adnexal masses. Other: No free fluid or free air. Musculoskeletal: No acute bony abnormality. IMPRESSION: 1. Punctate bilateral nephrolithiasis. No ureteral stones or hydronephrosis. 2. No acute findings in the abdomen or pelvis. Electronically Signed   By: Charlett Nose M.D.   On: 12/31/2022 20:43   CT Angio Chest PE W and/or Wo Contrast  Result Date: 12/31/2022 CLINICAL DATA:  Pulmonary embolism (PE) suspected, high prob EXAM: CT ANGIOGRAPHY CHEST WITH CONTRAST TECHNIQUE: Multidetector CT imaging of the chest was performed using the standard protocol during bolus administration of intravenous contrast. Multiplanar CT image reconstructions and MIPs were obtained to evaluate the vascular anatomy. RADIATION DOSE REDUCTION: This exam was performed according to the departmental dose-optimization program which includes automated exposure control, adjustment of the mA and/or kV according to patient size and/or use of iterative reconstruction technique. CONTRAST:  OMNIPAQUE IOHEXOL 350 MG/ML SOLN COMPARISON:  09/12/2021 FINDINGS: Cardiovascular: No filling defects in the pulmonary arteries to suggest pulmonary emboli. Prominent main pulmonary artery measuring 4 cm with prominent central pulmonary vessels. This suggest pulmonary arterial hypertension. No evidence of aortic aneurysm. Mediastinum/Nodes: No mediastinal, hilar, or axillary adenopathy. Trachea and esophagus are unremarkable. Thyroid unremarkable. Lungs/Pleura: Lungs are clear. No focal airspace opacities or suspicious nodules. No effusions. Upper Abdomen: No acute findings Musculoskeletal: Chest wall soft tissues are unremarkable. No acute bony abnormality. Review of the MIP images confirms the above findings. IMPRESSION: 1. No evidence of pulmonary embolus. 2. Prominent main pulmonary artery and central pulmonary vessels suggest pulmonary arterial  hypertension. 3. No acute cardiopulmonary disease. Electronically Signed   By: Charlett Nose M.D.   On: 12/31/2022 20:41    Scheduled Meds:    lidocaine  1 patch Transdermal Q24H   pantoprazole  40 mg Oral Daily    Continuous Infusions:    cefTRIAXone (ROCEPHIN)  IV     lactated ringers 50 mL/hr at 01/01/23 1029     LOS: 1 day     Marcellus Scott, MD,  FACP, [facility], [facility], [facility], [facility]   [facility] [place]     To contact the attending provider between 7A-7P or the covering provider during after hours 7P-7A, please log into the web site [email or web address] and access using universal [place] password for that web site. If you do not have the password, please call the hospital operator.  01/01/2023, 5:33 PM

## 2023-01-01 NOTE — Evaluation (Signed)
Occupational Therapy Evaluation Patient Details Name: Connie Mcneil MRN: [id number] DOB: 19-Jul-1965 Today's Date: 01/01/2023   History of Present Illness 57 y.o. female with medical history significant for pyelonephritis in 2023, essential pretension, anemia of chronic disease associated baseline hemoglobin 9-12, who is admitted to [facility] on 12/31/2022 with severe sepsis due to right-sided pyelonephritis   Clinical Impression   The pt is currently presenting below her baseline level of functioning for self-care management, as she is limited by reports of severe 9/10 low back pain, slight generalized weakness, and mild deconditioning. Given her increased pain, she presented with slow, guarded, movements, subsequently requiring increased time and effort for progressive activity. OT anticipates her overall occupational performance and functional abilities will improve significantly, pending improvement in her pain. OT will follow her in the acute setting to optimize her ADL performance, to decrease the risk for restricted participation in meaningful activities, and to facilitate her safe return home.       Recommendations for follow up therapy are one component of a multi-disciplinary discharge planning process, led by the attending physician.  Recommendations may be updated based on patient status, additional functional criteria and insurance authorization.   Assistance Recommended at Discharge PRN  Patient can return home with the following Assistance with cooking/housework;Assist for transportation;Help with stairs or ramp for entrance    Functional Status Assessment  Patient has had a recent decline in their functional status and demonstrates the ability to make significant improvements in function in a reasonable and predictable amount of time.  Equipment Recommendations  None recommended by OT       Precautions / Restrictions Precautions Precautions:  Fall Restrictions Weight Bearing Restrictions: No      Mobility Bed Mobility Overal bed mobility: Needs Assistance Bed Mobility: Supine to Sit, Sit to Supine     Supine to sit: Modified independent (Device/Increase time), HOB elevated     General bed mobility comments: required increased time and effort due to back pain    Transfers Overall transfer level: Needs assistance Equipment used: None Transfers: Sit to/from Stand Sit to Stand: Supervision                  Balance     Sitting balance-Leahy Scale: Good       Standing balance-Leahy Scale: Fair           ADL either performed or assessed with clinical judgement   ADL Overall ADL's : Needs assistance/impaired Eating/Feeding: Independent;Bed level   Grooming: Set up;Bed level           Upper Body Dressing : Set up;Bed level   Lower Body Dressing: Moderate assistance                        Pertinent Vitals/Pain Pain Assessment Pain Assessment: 0-10 Pain Score: 9  Pain Location: back Pain Intervention(s): Limited activity within patient's tolerance, Monitored during session, Repositioned     Hand Dominance Right   Extremity/Trunk Assessment Upper Extremity Assessment Upper Extremity Assessment: Overall WFL for tasks assessed   Lower Extremity Assessment Lower Extremity Assessment: Overall WFL for tasks assessed      Communication Communication Communication: No difficulties   Cognition Arousal/Alertness: Awake/alert Behavior During Therapy: WFL for tasks assessed/performed Overall Cognitive Status: Within Functional Limits for tasks assessed    General Comments: Oriented x4, able to follow commands without difficulty                Home Living  Family/patient expects to be discharged to:: Private residence Living Arrangements: Children (daughter) Available Help at Discharge: Family Type of Home: House      Home Layout: Two level;1/2 bath on main level;Full  bath on main level Alternate Level Stairs-Number of Steps:  (her bedroom is upstairs)            Home Equipment: None          Prior Functioning/Environment Prior Level of Function : Independent/Modified Independent;Working/employed;Driving                        OT Problem List: Pain;Decreased strength;Decreased activity tolerance      OT Treatment/Interventions: Self-care/ADL training;Therapeutic exercise;DME and/or AE instruction;Therapeutic activities;Patient/family education    OT Goals(Current goals can be found in the care plan section) Acute Rehab OT Goals Patient Stated Goal: decreased pain and to feel better OT Goal Formulation: With patient Time For Goal Achievement: 01/15/23 Potential to Achieve Goals: Good ADL Goals Pt Will Perform Grooming: with modified independence;standing Pt Will Perform Lower Body Dressing: with modified independence;sit to/from stand Pt Will Transfer to Toilet: with modified independence;ambulating Pt Will Perform Toileting - Clothing Manipulation and hygiene: with modified independence;sit to/from stand  OT Frequency: Min 1X/week       AM-PAC OT "6 Clicks" Daily Activity     Outcome Measure Help from another person eating meals?: None Help from another person taking care of personal grooming?: A Little Help from another person toileting, which includes using toliet, bedpan, or urinal?: A Little Help from another person bathing (including washing, rinsing, drying)?: A Lot Help from another person to put on and taking off regular upper body clothing?: A Little Help from another person to put on and taking off regular lower body clothing?: A Lot 6 Click Score: 17   End of Session Equipment Utilized During Treatment: Other (comment) (N/A) Nurse Communication: Mobility status  Activity Tolerance: Patient limited by pain Patient left: in bed;with call bell/phone within reach  OT Visit Diagnosis: Pain;Muscle weakness  (generalized) (M62.81) Pain - part of body:  (back)                Time: 4782-9562 OT Time Calculation (min): 22 min Charges:  OT General Charges $OT Visit: 1 Visit OT Evaluation $OT Eval Low Complexity: 1 Low    Connie Mcneil, OTR/L 01/01/2023, 5:22 PM

## 2023-01-01 NOTE — Progress Notes (Signed)
Transition of Care [facility]) - Inpatient Brief Assessment   Patient Details  Name: Connie Mcneil MRN: [id number] Date of Birth: January 11, 1966  Transition of Care [facility]) CM/SW Contact:    Larrie Kass, LCSW Phone Number: 01/01/2023, 4:15 PM   Clinical Narrative:   Transition of Care Department [facility]) has reviewed patient and no TOC needs have been identified at this time. We will continue to monitor patient advancement through interdisciplinary progression rounds. If new patient transition needs arise, please place a TOC consult.  Transition of Care Asessment: Insurance and Status: Insurance coverage has been reviewed Patient has primary care physician: Yes Home environment has been reviewed: yes   Prior/Current Home Services: No current home services Social Determinants of Health Reivew: SDOH reviewed no interventions necessary Readmission risk has been reviewed: Yes Transition of care needs: no transition of care needs at this time

## 2023-01-01 NOTE — Plan of Care (Signed)
  Problem: Education: Goal: Knowledge of General Education information will improve Description: Including pain rating scale, medication(s)/side effects and non-pharmacologic comfort measures Outcome: Progressing   Problem: Health Behavior/Discharge Planning: Goal: Ability to manage health-related needs will improve Outcome: Progressing   Problem: Clinical Measurements: Goal: Will remain free from infection Outcome: Progressing Goal: Diagnostic test results will improve Outcome: Progressing Goal: Respiratory complications will improve Outcome: Progressing Goal: Cardiovascular complication will be avoided Outcome: Progressing   Problem: Activity: Goal: Risk for activity intolerance will decrease Outcome: Progressing   Problem: Coping: Goal: Level of anxiety will decrease Outcome: Progressing   Problem: Elimination: Goal: Will not experience complications related to bowel motility Outcome: Progressing Goal: Will not experience complications related to urinary retention Outcome: Progressing   Problem: Pain Managment: Goal: General experience of comfort will improve Outcome: Progressing

## 2023-01-01 NOTE — Progress Notes (Signed)
    Patient Name: Connie Mcneil           DOB: [birth year]  MRN: [id number]      Admission Date: 12/31/2022  Attending Provider: Angie Fava, DO  Primary Diagnosis: Pyelonephritis of right kidney   Level of care: Progressive    CROSS COVER NOTE   Date of Service   01/01/2023   Connie Mcneil, 57 y.o. female, was admitted on 12/31/2022 for Pyelonephritis of right kidney.    HPI/Events of Note   Elevated lactic acid Lactic acid 2.0--> now 3.6 Currently admitted for severe sepsis secondary to right-sided pyelonephritis.  Blood cultures, urine culture has been collected.  Patient is currently on IV Rocephin.  Fluid resuscitation bolus was recently completed.   Patient has remained afebrile and hemodynamic medically stable.   Interventions/ Plan   Repeat lactic acid Fluid bolus, 500 cc        Anthoney Harada, DNP, ACNPC- AG [facility] [place]

## 2023-01-02 DIAGNOSIS — R7881 Bacteremia: Secondary | ICD-10-CM

## 2023-01-02 DIAGNOSIS — N12 Tubulo-interstitial nephritis, not specified as acute or chronic: Secondary | ICD-10-CM | POA: Diagnosis not present

## 2023-01-02 LAB — CBC
HCT: 30.8 % — ABNORMAL LOW (ref 36.0–46.0)
Hemoglobin: 9.8 g/dL — ABNORMAL LOW (ref 12.0–15.0)
MCH: 25.1 pg — ABNORMAL LOW (ref 26.0–34.0)
MCHC: 31.8 g/dL (ref 30.0–36.0)
MCV: 78.8 fL — ABNORMAL LOW (ref 80.0–100.0)
Platelets: 183 10*3/uL (ref 150–400)
RBC: 3.91 MIL/uL (ref 3.87–5.11)
RDW: 15.9 % — ABNORMAL HIGH (ref 11.5–15.5)
WBC: 9 10*3/uL (ref 4.0–10.5)
nRBC: 0 % (ref 0.0–0.2)

## 2023-01-02 LAB — BASIC METABOLIC PANEL
Anion gap: 6 (ref 5–15)
BUN: 12 mg/dL (ref 6–20)
CO2: 17 mmol/L — ABNORMAL LOW (ref 22–32)
Calcium: 8.2 mg/dL — ABNORMAL LOW (ref 8.9–10.3)
Chloride: 111 mmol/L (ref 98–111)
Creatinine, Ser: 0.8 mg/dL (ref 0.44–1.00)
GFR, Estimated: >60 mL/min (ref >60)
Glucose, Bld: 104 mg/dL — ABNORMAL HIGH (ref 70–99)
Potassium: 3.6 mmol/L (ref 3.5–5.1)
Sodium: 134 mmol/L — ABNORMAL LOW (ref 135–145)

## 2023-01-02 LAB — BLOOD CULTURE ID PANEL (REFLEXED) - BCID2

## 2023-01-02 LAB — URINE CULTURE: Culture: >=100000 — AB

## 2023-01-02 MED ORDER — HYDROMORPHONE HCL 1 MG/ML IJ SOLN
0.5000 mg | INTRAMUSCULAR | Status: DC | PRN
  Administered 2023-01-02 (×2): 1 mg via INTRAVENOUS
  Administered 2023-01-03: 0.5 mg via INTRAVENOUS
  Administered 2023-01-03: 1 mg via INTRAVENOUS
  Filled 2023-01-02 (×4): qty 1

## 2023-01-02 MED ORDER — PHENTERMINE HCL 37.5 MG PO CAPS: 37.5000 mg | ORAL_CAPSULE | ORAL | Status: DC

## 2023-01-02 MED ORDER — TOPIRAMATE 100 MG PO TABS
100.0000 mg | ORAL_TABLET | Freq: Every day | ORAL | Status: DC
  Administered 2023-01-02 – 2023-01-04 (×3): 100 mg via ORAL
  Filled 2023-01-02 (×3): qty 1

## 2023-01-02 MED ORDER — SODIUM CHLORIDE 0.9 % IV SOLN
2.0000 g | INTRAVENOUS | Status: DC
  Administered 2023-01-02 – 2023-01-03 (×2): 2 g via INTRAVENOUS
  Filled 2023-01-02 (×2): qty 20

## 2023-01-02 MED ORDER — OXYCODONE HCL 5 MG PO TABS
5.0000 mg | ORAL_TABLET | Freq: Four times a day (QID) | ORAL | Status: DC | PRN
  Administered 2023-01-02 (×2): 5 mg via ORAL
  Administered 2023-01-03 (×2): 10 mg via ORAL
  Filled 2023-01-02: qty 2
  Filled 2023-01-02 (×2): qty 1
  Filled 2023-01-02: qty 2

## 2023-01-02 NOTE — Progress Notes (Addendum)
PROGRESS NOTE   Connie Mcneil  ZOX:[id number]    DOB: 27-Oct-1965    DOA: 12/31/2022  PCP: Courtney Paris, NP   I have briefly reviewed patients previous medical records in [facility].  Chief Complaint  Patient presents with   Flank Pain    Brief Narrative:  57 year old female with PMH of GERD, obesity, pyelonephritis (09/16/2021), anemia of chronic disease, presented to the ED with 3 days history of subacute onset of right flank pain, intermittent nausea, subjective fever and chills but no dysuria.  Report symptoms similar to prior episode of pyelonephritis.  Seen at urgent care, noted to have soft blood pressures, advised ED visit.  Admitted for sepsis due to suspected acute right pyelonephritis.  Sepsis ruled out.   Assessment & Plan:  Principal Problem:   Pyelonephritis of right kidney Active Problems:   Severe sepsis (HCC)   AKI (acute kidney injury) (HCC)   Right flank pain   Generalized weakness   Essential hypertension   Anemia of chronic disease   Sepsis due to E. coli acute right pyelonephritis and bacteremia Met criteria by sepsis 3 guidelines.  Sofa score of 3 (creatinine 1.28, total bilirubin 2). Urine microscopy showed many bacteria and 21-50 WBCs per high-power field. Despite urine microscopy suggestive of UTI, interestingly patient did not have any urine or symptoms but presented with right flank pain similar to prior episodes of pyelonephritis.  Moreover CT abdomen without features of pyelonephritis.  Showed nonobstructive nephrolithiasis.  CTA chest negative for PE. DD: Musculoskeletal pain versus others. Elevated lactate, peaked to 3.6 which has since resolved. S/p 3.5 l IV fluid bolus, continued on maintenance IV fluids.  Now developing lymphedema, discontinued IVF. On Ceftriaxone 2gm q 24 hours. Preliminary blood and urine culture showing E. coli, sensitivities pending. Continue ceftriaxone to 2 g daily. Multimodality pain control: Added lidocaine patch  and as needed Robaxin if musculoskeletal in etiology.  Right flank pain is better but still rates at 8/10 in severity.   Addendum: Reviewed case with infectious disease MD on-call on 6/28.  She recommended continuing Ceftriaxone pending final sensitivities.  Acute kidney injury: Most recent creatinine in March 2023 was in the 0.6-0.8 range.  Presented with creatinine of 1.28 which is more than 0.3 from baseline. Treated with aggressive IV fluid hydration since admission and AKI resolved.  Creatinine down to 0.8. Continue to hold HCTZ.  Avoid nephrotoxics.  Microcytic anemia: Initially appears to have come and hemoconcentrated with hemoglobin of 11.1.  Post hydration, this has dropped to 9.3 which is her baseline from 2023.  B12 1031. Stable.  Hypokalemia: Replaced.  Magnesium 1.9.  GERD: PPI initiated.  Body mass index is 42.64 kg/m./Morbid obesity: Outpatient follow-up.   ACP Documents: None present DVT prophylaxis: SCDs Start: 12/31/22 2245     Code Status: Full Code:  Family Communication: Discussed with patient's daughter via patient's phone at bedside.  Updated care and answered all questions. Disposition:  Status is: Inpatient Remains inpatient appropriate because: IV antibiotics.  Possible DC in 24 to 48 hours.     Consultants:     Procedures:     Antimicrobials:   IV ceftriaxone.   Subjective:  Reports feeling slightly better.  Indicates that right flank pain was severe on presentation and now rates it at 8/10 in severity.  Nausea present but no vomiting.  BM prior to admission but none since.  States that she ambulated with PT yesterday (walked 80 feet) and was up in chair for about half  hour.  Objective:   Vitals:   01/01/23 1248 01/01/23 2124 01/02/23 0500 01/02/23 0623  BP: (!) 97/59 132/66  (!) 118/53  Pulse: 75 88  86  Resp: 16 18  19   Temp: 98.3 F (36.8 C) 99.2 F (37.3 C)  98.4 F (36.9 C)  TempSrc: Oral Oral  Oral  SpO2: 100% 99%  95%   Weight:   112.7 kg   Height:        General exam: Young female, moderately built and obese, looks better than yesterday but weak. Respiratory system: Clear to auscultation.  No increased work of breathing. Cardiovascular system: S1 & S2 heard, RRR. No JVD, murmurs, rubs, gallops or clicks. No pedal edema.  Telemetry personally reviewed: Sinus rhythm, discontinued telemetry 6/28. Gastrointestinal system: Abdomen is nondistended, soft and nontender. No organomegaly or masses felt. Normal bowel sounds heard. GU: Right CVA tenderness. Central nervous system: Alert and oriented. No focal neurological deficits. Extremities: Symmetric 5 x 5 power. Skin: No rashes, lesions or ulcers Psychiatry: Judgement and insight appear normal. Mood & affect appropriate.     Data Reviewed:   I have personally reviewed following labs and imaging studies   CBC: Recent Labs  Lab 12/31/22 1820 01/01/23 0423 01/02/23 0421  WBC 12.9* 9.3 9.0  NEUTROABS  --  7.6  --   HGB 11.1* 9.3* 9.8*  HCT 35.0* 29.1* 30.8*  MCV 79.2* 78.0* 78.8*  PLT 203 183 183    Basic Metabolic Panel: Recent Labs  Lab 12/31/22 1820 01/01/23 0423 01/02/23 0421  NA 137 138 134*  K 3.5 3.3* 3.6  CL 113* 111 111  CO2 16* 18* 17*  GLUCOSE 114* 89 104*  BUN 15 15 12   CREATININE 1.28* 1.03* 0.80  CALCIUM 7.7* 8.1* 8.2*  MG  --  1.9  --     Liver Function Tests: Recent Labs  Lab 12/31/22 1820 01/01/23 0423  AST 39 28  ALT 19 21  ALKPHOS 78 68  BILITOT 2.0* 1.0  PROT 6.2* 5.6*  ALBUMIN 3.0* 2.6*    CBG: Recent Labs  Lab 12/31/22 2357  GLUCAP 102*    Microbiology Studies:   Recent Results (from the past 240 hour(s))  Urine Culture     Status: Abnormal (Preliminary result)   Collection Time: 12/31/22  9:59 PM   Specimen: Urine, Clean Catch  Result Value Ref Range Status   Specimen Description   Final    URINE, CLEAN CATCH Performed at [facility], [address] [place],  [place] [address]    Special Requests   Final    NONE Performed at [facility], [address] [place], [place] [address]    Culture (A)  Final    >=100,000 COLONIES/mL ESCHERICHIA COLI SUSCEPTIBILITIES TO FOLLOW Performed at [facility], [address], [place], [place] [address]    Report Status PENDING  Incomplete  Blood culture (routine x 2)     Status: Abnormal (Preliminary result)   Collection Time: 12/31/22 11:00 PM   Specimen: BLOOD  Result Value Ref Range Status   Specimen Description   Final    BLOOD SITE NOT SPECIFIED Performed at [facility], [address], [place], [place] [address]    Special Requests   Final    BOTTLES DRAWN AEROBIC ONLY Blood Culture adequate volume Performed at [facility], [address] [place], [place] [address]    Culture  Setup Time   Final  GRAM NEGATIVE RODS BOTTLES DRAWN AEROBIC ONLY CRITICAL RESULT CALLED TO, READ BACK BY AND VERIFIED WITH: PHARMD E. JACKSON 01/02/23 @ 0025 BY AB    Culture (A)  Final    ESCHERICHIA COLI CULTURE REINCUBATED FOR BETTER GROWTH Performed at [facility], [address], [place], [place] [address]    Report Status PENDING  Incomplete  Blood culture (routine x 2)     Status: None (Preliminary result)   Collection Time: 12/31/22 11:00 PM   Specimen: BLOOD  Result Value Ref Range Status   Specimen Description   Final    BLOOD SITE NOT SPECIFIED Performed at [facility], [address], [place], [place] [address]    Special Requests   Final    BOTTLES DRAWN AEROBIC AND ANAEROBIC Blood Culture adequate volume Performed at [facility], [address] [place], [place] [address]    Culture   Final    NO GROWTH 1 DAY Performed at [facility], [address], [place], [place] [address]    Report Status PENDING  Incomplete  Blood Culture ID Panel (Reflexed)     Status: Abnormal    Collection Time: 12/31/22 11:00 PM  Result Value Ref Range Status   Enterococcus faecalis NOT DETECTED NOT DETECTED Final   Enterococcus Faecium NOT DETECTED NOT DETECTED Final   Listeria monocytogenes NOT DETECTED NOT DETECTED Final   Staphylococcus species NOT DETECTED NOT DETECTED Final   Staphylococcus aureus (BCID) NOT DETECTED NOT DETECTED Final   Staphylococcus epidermidis NOT DETECTED NOT DETECTED Final   Staphylococcus lugdunensis NOT DETECTED NOT DETECTED Final   Streptococcus species NOT DETECTED NOT DETECTED Final   Streptococcus agalactiae NOT DETECTED NOT DETECTED Final   Streptococcus pneumoniae NOT DETECTED NOT DETECTED Final   Streptococcus pyogenes NOT DETECTED NOT DETECTED Final   A.calcoaceticus-baumannii NOT DETECTED NOT DETECTED Final   Bacteroides fragilis NOT DETECTED NOT DETECTED Final   Enterobacterales DETECTED (A) NOT DETECTED Final    Comment: Enterobacterales represent a large order of gram negative bacteria, not a single organism. CRITICAL RESULT CALLED TO, READ BACK BY AND VERIFIED WITH: PHARMD E. JACKSON 01/02/23 @ 0025 BY AB    Enterobacter cloacae complex NOT DETECTED NOT DETECTED Final   Escherichia coli DETECTED (A) NOT DETECTED Final    Comment: CRITICAL RESULT CALLED TO, READ BACK BY AND VERIFIED WITH: PHARMD E. JACKSON 01/02/23 @ 0025 BY AB    Klebsiella aerogenes NOT DETECTED NOT DETECTED Final   Klebsiella oxytoca NOT DETECTED NOT DETECTED Final   Klebsiella pneumoniae NOT DETECTED NOT DETECTED Final   Proteus species NOT DETECTED NOT DETECTED Final   Salmonella species NOT DETECTED NOT DETECTED Final   Serratia marcescens NOT DETECTED NOT DETECTED Final   Haemophilus influenzae NOT DETECTED NOT DETECTED Final   Neisseria meningitidis NOT DETECTED NOT DETECTED Final   Pseudomonas aeruginosa NOT DETECTED NOT DETECTED Final   Stenotrophomonas maltophilia NOT DETECTED NOT DETECTED Final   Candida albicans NOT DETECTED NOT DETECTED Final    Candida auris NOT DETECTED NOT DETECTED Final   Candida glabrata NOT DETECTED NOT DETECTED Final   Candida krusei NOT DETECTED NOT DETECTED Final   Candida parapsilosis NOT DETECTED NOT DETECTED Final   Candida tropicalis NOT DETECTED NOT DETECTED Final   Cryptococcus neoformans/gattii NOT DETECTED NOT DETECTED Final   CTX-M ESBL NOT DETECTED NOT DETECTED Final   Carbapenem resistance IMP NOT DETECTED NOT DETECTED Final   Carbapenem resistance KPC NOT DETECTED NOT DETECTED  Final   Carbapenem resistance NDM NOT DETECTED NOT DETECTED Final   Carbapenem resist OXA 48 LIKE NOT DETECTED NOT DETECTED Final   Carbapenem resistance VIM NOT DETECTED NOT DETECTED Final    Comment: Performed at [facility], [address], [place], [place] [address]    Radiology Studies:  CT ABDOMEN PELVIS W CONTRAST  Result Date: 12/31/2022 CLINICAL DATA:  Right flank, lower back pain EXAM: CT ABDOMEN AND PELVIS WITH CONTRAST TECHNIQUE: Multidetector CT imaging of the abdomen and pelvis was performed using the standard protocol following bolus administration of intravenous contrast. RADIATION DOSE REDUCTION: This exam was performed according to the departmental dose-optimization program which includes automated exposure control, adjustment of the mA and/or kV according to patient size and/or use of iterative reconstruction technique. CONTRAST:  OMNIPAQUE IOHEXOL 350 MG/ML SOLN COMPARISON:  09/12/2021 FINDINGS: Lower chest: No acute findings Hepatobiliary: Prior cholecystectomy. Mild intrahepatic and extrahepatic biliary ductal prominence compatible with post cholecystectomy state, stable. Stable 7 mm cyst in the right lobe of the liver, unchanged. Pancreas: No focal abnormality or ductal dilatation. Spleen: No focal abnormality.  Normal size. Adrenals/Urinary Tract: Innumerable bilateral renal cysts, unchanged. No follow-up imaging recommended. Punctate bilateral stones, nonobstructing. No ureteral stones or  hydronephrosis. Adrenal glands and urinary bladder unremarkable. Stomach/Bowel: Stomach, large and small bowel grossly unremarkable. Vascular/Lymphatic: No evidence of aneurysm or adenopathy. Reproductive: Prior hysterectomy.  No adnexal masses. Other: No free fluid or free air. Musculoskeletal: No acute bony abnormality. IMPRESSION: 1. Punctate bilateral nephrolithiasis. No ureteral stones or hydronephrosis. 2. No acute findings in the abdomen or pelvis. Electronically Signed   By: Charlett Nose M.D.   On: 12/31/2022 20:43   CT Angio Chest PE W and/or Wo Contrast  Result Date: 12/31/2022 CLINICAL DATA:  Pulmonary embolism (PE) suspected, high prob EXAM: CT ANGIOGRAPHY CHEST WITH CONTRAST TECHNIQUE: Multidetector CT imaging of the chest was performed using the standard protocol during bolus administration of intravenous contrast. Multiplanar CT image reconstructions and MIPs were obtained to evaluate the vascular anatomy. RADIATION DOSE REDUCTION: This exam was performed according to the departmental dose-optimization program which includes automated exposure control, adjustment of the mA and/or kV according to patient size and/or use of iterative reconstruction technique. CONTRAST:  OMNIPAQUE IOHEXOL 350 MG/ML SOLN COMPARISON:  09/12/2021 FINDINGS: Cardiovascular: No filling defects in the pulmonary arteries to suggest pulmonary emboli. Prominent main pulmonary artery measuring 4 cm with prominent central pulmonary vessels. This suggest pulmonary arterial hypertension. No evidence of aortic aneurysm. Mediastinum/Nodes: No mediastinal, hilar, or axillary adenopathy. Trachea and esophagus are unremarkable. Thyroid unremarkable. Lungs/Pleura: Lungs are clear. No focal airspace opacities or suspicious nodules. No effusions. Upper Abdomen: No acute findings Musculoskeletal: Chest wall soft tissues are unremarkable. No acute bony abnormality. Review of the MIP images confirms the above findings. IMPRESSION: 1.  No evidence of pulmonary embolus. 2. Prominent main pulmonary artery and central pulmonary vessels suggest pulmonary arterial hypertension. 3. No acute cardiopulmonary disease. Electronically Signed   By: Charlett Nose M.D.   On: 12/31/2022 20:41    Scheduled Meds:    lidocaine  1 patch Transdermal Q24H   pantoprazole  40 mg Oral Daily    Continuous Infusions:    cefTRIAXone (ROCEPHIN)  IV 2 g (01/01/23 2216)     LOS: 2 days     Marcellus Scott, MD,  FACP, [facility], [facility], [facility], [facility]   [facility] [place]     To contact the attending provider between 7A-7P or  the covering provider during after hours 7P-7A, please log into the web site [email or web address] and access using universal [place] password for that web site. If you do not have the password, please call the hospital operator.  01/02/2023, 1:29 PM

## 2023-01-02 NOTE — Progress Notes (Signed)
Physical Therapy Treatment Patient Details Name: Connie Mcneil MRN: [id number] DOB: [birth year] Today's Date: 01/02/2023   History of Present Illness 57 y.o. female with medical history significant for pyelonephritis in 2023, essential pretension, anemia of chronic disease associated baseline hemoglobin 9-12, who is admitted to [facility] on 12/31/2022 with severe sepsis due to right-sided pyelonephritis    PT Comments    Pt agreeable to mobilize and able to ambulate approx 80 feet in hallway.  Pt requiring increased time as well as bil UE support due to fatigue and back pain.  Pt reports she has been ambulating to bathroom with staff today as well.     Recommendations for follow up therapy are one component of a multi-disciplinary discharge planning process, led by the attending physician.  Recommendations may be updated based on patient status, additional functional criteria and insurance authorization.  Follow Up Recommendations       Assistance Recommended at Discharge PRN  Patient can return home with the following     Equipment Recommendations  Rolling walker (2 wheels) (may need upon d/c)    Recommendations for Other Services       Precautions / Restrictions Precautions Precautions: Fall     Mobility  Bed Mobility Overal bed mobility: Modified Independent             General bed mobility comments: required increased time and effort due to back pain    Transfers Overall transfer level: Needs assistance Equipment used: None Transfers: Sit to/from Stand Sit to Stand: Min guard           General transfer comment: min/guard for safety    Ambulation/Gait Ambulation/Gait assistance: Min guard Gait Distance (Feet): 80 Feet Assistive device: Rolling walker (2 wheels) Gait Pattern/deviations: Step-through pattern, Decreased stride length Gait velocity: decr     General Gait Details: pt requiring bil UE support so provided with RW (pt a little  reluctant to use initially but did have better stability with bil UE support on RW); reports back pain limiting distance; increased time required   Stairs             Wheelchair Mobility    Modified Rankin (Stroke Patients Only)       Balance                                            Cognition Arousal/Alertness: Awake/alert Behavior During Therapy: WFL for tasks assessed/performed Overall Cognitive Status: Within Functional Limits for tasks assessed                                          Exercises      General Comments        Pertinent Vitals/Pain Pain Assessment Pain Assessment: 0-10 Faces Pain Scale: Hurts whole lot Pain Location: back Pain Descriptors / Indicators: Sore, Discomfort Pain Intervention(s): Repositioned, Monitored during session    Home Living                          Prior Function            PT Goals (current goals can now be found in the care plan section) Progress towards PT goals: Progressing toward goals    Frequency  Min 1X/week      PT Plan Current plan remains appropriate    Co-evaluation              AM-PAC PT "6 Clicks" Mobility   Outcome Measure  Help needed turning from your back to your side while in a flat bed without using bedrails?: A Little Help needed moving from lying on your back to sitting on the side of a flat bed without using bedrails?: A Little Help needed moving to and from a bed to a chair (including a wheelchair)?: A Little Help needed standing up from a chair using your arms (e.g., wheelchair or bedside chair)?: A Little Help needed to walk in hospital room?: A Little Help needed climbing 3-5 steps with a railing? : A Little 6 Click Score: 18    End of Session Equipment Utilized During Treatment: Gait belt Activity Tolerance: Patient tolerated treatment well Patient left: in chair;with call bell/phone within reach;with chair alarm  set Nurse Communication: Mobility status PT Visit Diagnosis: Difficulty in walking, not elsewhere classified (R26.2)     Time: 1610-9604 PT Time Calculation (min) (ACUTE ONLY): 20 min  Charges:  $Gait Training: 8-22 mins                    Paulino Door, DPT Physical Therapist Acute Rehabilitation Services Office: [phone]    Janan Halter P[place] 01/02/2023, 5:20 PM

## 2023-01-02 NOTE — Plan of Care (Signed)

## 2023-01-02 NOTE — Progress Notes (Signed)
PHARMACY - PHYSICIAN COMMUNICATION CRITICAL VALUE ALERT - BLOOD CULTURE IDENTIFICATION (BCID)  Connie Mcneil is an 57 y.o. female who presented to [facility] on 12/31/2022 with a chief complaint of right flank pain, intermittent nausea, subjective fever and chills but no dysuria   Assessment: 1/3 Ecoli, no resistance  Name of physician (or Provider) Contacted: Liana Crocker  Current antibiotics: ceftriaxone 2gm IV q24h  Changes to prescribed antibiotics recommended:  Patient is on recommended antibiotics - No changes needed  Results for orders placed or performed during the hospital encounter of 12/31/22  Blood Culture ID Panel (Reflexed) (Collected: 12/31/2022 11:00 PM)  Result Value Ref Range   Enterococcus faecalis NOT DETECTED NOT DETECTED   Enterococcus Faecium NOT DETECTED NOT DETECTED   Listeria monocytogenes NOT DETECTED NOT DETECTED   Staphylococcus species NOT DETECTED NOT DETECTED   Staphylococcus aureus (BCID) NOT DETECTED NOT DETECTED   Staphylococcus epidermidis NOT DETECTED NOT DETECTED   Staphylococcus lugdunensis NOT DETECTED NOT DETECTED   Streptococcus species NOT DETECTED NOT DETECTED   Streptococcus agalactiae NOT DETECTED NOT DETECTED   Streptococcus pneumoniae NOT DETECTED NOT DETECTED   Streptococcus pyogenes NOT DETECTED NOT DETECTED   A.calcoaceticus-baumannii NOT DETECTED NOT DETECTED   Bacteroides fragilis NOT DETECTED NOT DETECTED   Enterobacterales DETECTED (A) NOT DETECTED   Enterobacter cloacae complex NOT DETECTED NOT DETECTED   Escherichia coli DETECTED (A) NOT DETECTED   Klebsiella aerogenes NOT DETECTED NOT DETECTED   Klebsiella oxytoca NOT DETECTED NOT DETECTED   Klebsiella pneumoniae NOT DETECTED NOT DETECTED   Proteus species NOT DETECTED NOT DETECTED   Salmonella species NOT DETECTED NOT DETECTED   Serratia marcescens NOT DETECTED NOT DETECTED   Haemophilus influenzae NOT DETECTED NOT DETECTED   Neisseria meningitidis NOT DETECTED NOT  DETECTED   Pseudomonas aeruginosa NOT DETECTED NOT DETECTED   Stenotrophomonas maltophilia NOT DETECTED NOT DETECTED   Candida albicans NOT DETECTED NOT DETECTED   Candida auris NOT DETECTED NOT DETECTED   Candida glabrata NOT DETECTED NOT DETECTED   Candida krusei NOT DETECTED NOT DETECTED   Candida parapsilosis NOT DETECTED NOT DETECTED   Candida tropicalis NOT DETECTED NOT DETECTED   Cryptococcus neoformans/gattii NOT DETECTED NOT DETECTED   CTX-M ESBL NOT DETECTED NOT DETECTED   Carbapenem resistance IMP NOT DETECTED NOT DETECTED   Carbapenem resistance KPC NOT DETECTED NOT DETECTED   Carbapenem resistance NDM NOT DETECTED NOT DETECTED   Carbapenem resist OXA 48 LIKE NOT DETECTED NOT DETECTED   Carbapenem resistance VIM NOT DETECTED NOT DETECTED    Arley Phenix RPh 01/02/2023, 12:28 AM

## 2023-01-03 DIAGNOSIS — R7881 Bacteremia: Secondary | ICD-10-CM | POA: Diagnosis not present

## 2023-01-03 DIAGNOSIS — N12 Tubulo-interstitial nephritis, not specified as acute or chronic: Secondary | ICD-10-CM | POA: Diagnosis not present

## 2023-01-03 LAB — URINE CULTURE

## 2023-01-03 LAB — BASIC METABOLIC PANEL
Anion gap: 8 (ref 5–15)
BUN: 10 mg/dL (ref 6–20)
CO2: 18 mmol/L — ABNORMAL LOW (ref 22–32)
Calcium: 8 mg/dL — ABNORMAL LOW (ref 8.9–10.3)
Chloride: 105 mmol/L (ref 98–111)
Creatinine, Ser: 0.89 mg/dL (ref 0.44–1.00)
GFR, Estimated: >60 mL/min (ref >60)
Glucose, Bld: 92 mg/dL (ref 70–99)
Potassium: 3.3 mmol/L — ABNORMAL LOW (ref 3.5–5.1)
Sodium: 131 mmol/L — ABNORMAL LOW (ref 135–145)

## 2023-01-03 LAB — CULTURE, BLOOD (ROUTINE X 2)
Special Requests: ADEQUATE
Special Requests: ADEQUATE

## 2023-01-03 LAB — GLUCOSE, CAPILLARY: Glucose-Capillary: 106 mg/dL — ABNORMAL HIGH (ref 70–99)

## 2023-01-03 MED ORDER — POTASSIUM CHLORIDE CRYS ER 20 MEQ PO TBCR
40.0000 meq | EXTENDED_RELEASE_TABLET | Freq: Once | ORAL | Status: AC
  Administered 2023-01-03: 40 meq via ORAL
  Filled 2023-01-03: qty 2

## 2023-01-03 MED ORDER — IBUPROFEN 800 MG PO TABS
800.0000 mg | ORAL_TABLET | Freq: Three times a day (TID) | ORAL | Status: AC
  Administered 2023-01-03 – 2023-01-05 (×6): 800 mg via ORAL
  Filled 2023-01-03 (×6): qty 1

## 2023-01-03 MED ORDER — OXYCODONE HCL 5 MG PO TABS
5.0000 mg | ORAL_TABLET | Freq: Four times a day (QID) | ORAL | Status: DC | PRN
  Administered 2023-01-04 – 2023-01-05 (×3): 5 mg via ORAL
  Filled 2023-01-03 (×3): qty 1

## 2023-01-03 NOTE — Progress Notes (Signed)
PROGRESS NOTE   Connie Mcneil  ZOX:[id number]    DOB: [birth year]    DOA: 12/31/2022  PCP: Courtney Paris, NP   I have briefly reviewed patients previous medical records in [facility].  Chief Complaint  Patient presents with   Flank Pain    Brief Narrative:  57 year old female with PMH of GERD, obesity, pyelonephritis (09/16/2021), anemia of chronic disease, presented to the ED with 3 days history of subacute onset of right flank pain, intermittent nausea, subjective fever and chills but no dysuria.  Report symptoms similar to prior episode of pyelonephritis.  Seen at urgent care, noted to have soft blood pressures, advised ED visit.  Admitted for sepsis due to E. coli acute right pyelonephritis and bacteremia.   Assessment & Plan:  Principal Problem:   Pyelonephritis of right kidney Active Problems:   Severe sepsis (HCC)   AKI (acute kidney injury) (HCC)   Right flank pain   Generalized weakness   Essential hypertension   Anemia of chronic disease   Sepsis due to E. coli acute right pyelonephritis and bacteremia - Met criteria by sepsis 3 guidelines.  Sofa score of 3 (creatinine 1.28, total bilirubin 2). - Urine microscopy showed many bacteria and 21-50 WBCs per high-power field. - Despite urine microscopy suggestive of UTI, interestingly patient did not have any urinary symptoms but presented with right flank pain similar to prior episodes of pyelonephritis.   - Elevated lactate, peaked to 3.6 which has since resolved. - S/p 3.5 l IV fluid bolus, continued on maintenance IV fluids.  Now developing peripheral edema, discontinued IVF. - On Ceftriaxone 2gm q 24 hours, continue.  I had communicated with ID MD on 6/28 who agreed. - Urine culture confirms pansensitive E. coli >100 K colonies.  Blood culture x 1 set shows E. coli, sensitivities pending. - Multimodality pain control: Continue lidocaine patch and as needed Robaxin.  Although the right flank pain is better, states  that it is only better by 30%, moreover also having sedation/AMS with opioids as noted with daughter at bedside, thereby reduced opioids, started ibuprofen 800 Mg 3 times daily x 2 days.  Monitor. - CT A/P on admission had shown punctate bilateral nephrolithiasis.  No ureteral stones or hydronephrosis, no other acute findings that would explain her ongoing pain despite appropriate treatment for pyelonephritis.  CTA chest had also shown no acute abnormalities in the chest wall soft tissue or skeleton.  No skin rash suggestive of herpes.  Acute kidney injury/NAGMA: - Most recent creatinine in March 2023 was in the 0.6-0.8 range.  Presented with creatinine of 1.28 which is more than 0.3 from baseline. - Treated with aggressive IV fluid hydration since admission and AKI resolved.  Creatinine down to 0.8. - Continue to hold HCTZ.  Avoid nephrotoxics as much as possible.  Microcytic anemia: - Initially appears to have come and hemoconcentrated with hemoglobin of 11.1.  Post hydration, this has dropped to 9.3 which is her baseline from 2023.  B12 1031. - Stable.  Hypokalemia: Recurrent.  Replace and follow.?  Related to poor oral intake.  GERD: - PPI initiated.  Body mass index is 43.14 kg/m./Morbid obesity: Outpatient follow-up.   ACP Documents: None present DVT prophylaxis: SCDs Start: 12/31/22 2245     Code Status: Full Code:  Family Communication: Daughter at bedside. Disposition:  Status is: Inpatient Remains inpatient appropriate because: IV antibiotics.  Possible DC in 24 to 48 hours.     Consultants:     Procedures:  Antimicrobials:   IV ceftriaxone.   Subjective:  Seen around midday.  Daughter at bedside.  Patient appears somewhat drowsy, lethargic and confused.  Received pain meds about an hour prior to my visit.  Objective:   Vitals:   01/03/23 0610 01/03/23 0815 01/03/23 0920 01/03/23 1408  BP:   (!) 155/101 (!) 100/59  Pulse:   88 81  Resp:   (!) 22 16   Temp:  98.9 F (37.2 C) 99.8 F (37.7 C) 98 F (36.7 C)  TempSrc:  Oral    SpO2:   98% 96%  Weight: 114 kg     Height:        General exam: Young female, moderately built and obese, lying comfortably in bed.  Oral mucosa moist.  Does not appear in any distress. Respiratory system: Clear to auscultation.  No increased work of breathing. Cardiovascular system: S1 & S2 heard, RRR. No JVD, murmurs, rubs, gallops or clicks. No pedal edema.  Off of telemetry. Gastrointestinal system: Abdomen is nondistended, soft and nontender. No organomegaly or masses felt. Normal bowel sounds heard. GU: Right CVA tenderness is better.  No rash around right flank. Central nervous system: Alert and oriented x 2. No focal neurological deficits. Extremities: Symmetric 5 x 5 power. Skin: No rashes, lesions or ulcers Psychiatry: Judgement and insight appear somewhat impaired currently. Mood & affect appropriate.     Data Reviewed:   I have personally reviewed following labs and imaging studies   CBC: Recent Labs  Lab 12/31/22 1820 01/01/23 0423 01/02/23 0421  WBC 12.9* 9.3 9.0  NEUTROABS  --  7.6  --   HGB 11.1* 9.3* 9.8*  HCT 35.0* 29.1* 30.8*  MCV 79.2* 78.0* 78.8*  PLT 203 183 183    Basic Metabolic Panel: Recent Labs  Lab 12/31/22 1820 01/01/23 0423 01/02/23 0421 01/03/23 0453  NA 137 138 134* 131*  K 3.5 3.3* 3.6 3.3*  CL 113* 111 111 105  CO2 16* 18* 17* 18*  GLUCOSE 114* 89 104* 92  BUN 15 15 12 10   CREATININE 1.28* 1.03* 0.80 0.89  CALCIUM 7.7* 8.1* 8.2* 8.0*  MG  --  1.9  --   --     Liver Function Tests: Recent Labs  Lab 12/31/22 1820 01/01/23 0423  AST 39 28  ALT 19 21  ALKPHOS 78 68  BILITOT 2.0* 1.0  PROT 6.2* 5.6*  ALBUMIN 3.0* 2.6*    CBG: Recent Labs  Lab 12/31/22 2357 01/03/23 1149  GLUCAP 102* 106*    Microbiology Studies:   Recent Results (from the past 240 hour(s))  Urine Culture     Status: Abnormal   Collection Time: 12/31/22  9:59 PM    Specimen: Urine, Clean Catch  Result Value Ref Range Status   Specimen Description   Final    URINE, CLEAN CATCH Performed at [facility], [address], [place], [place] [address]    Special Requests   Final    NONE Performed at [facility] [address], [place], [place] [address]    Culture >=100,000 COLONIES/mL ESCHERICHIA COLI (A)  Final   Report Status 01/03/2023 FINAL  Final   Organism ID, Bacteria ESCHERICHIA COLI (A)  Final      Susceptibility   Escherichia coli - MIC*    AMPICILLIN 4 SENSITIVE Sensitive     CEFAZOLIN <=4 SENSITIVE Sensitive     CEFEPIME <=0.12 SENSITIVE Sensitive     CEFTRIAXONE <=0.25 SENSITIVE Sensitive  CIPROFLOXACIN <=0.25 SENSITIVE Sensitive     GENTAMICIN <=1 SENSITIVE Sensitive     IMIPENEM <=0.25 SENSITIVE Sensitive     NITROFURANTOIN <=16 SENSITIVE Sensitive     TRIMETH/SULFA <=20 SENSITIVE Sensitive     AMPICILLIN/SULBACTAM <=2 SENSITIVE Sensitive     PIP/TAZO <=4 SENSITIVE Sensitive     * >=100,000 COLONIES/mL ESCHERICHIA COLI  Blood culture (routine x 2)     Status: Abnormal (Preliminary result)   Collection Time: 12/31/22 11:00 PM   Specimen: BLOOD  Result Value Ref Range Status   Specimen Description   Final    BLOOD SITE NOT SPECIFIED Performed at [facility], [address], [place], [place] [address]    Special Requests   Final    BOTTLES DRAWN AEROBIC ONLY Blood Culture adequate volume Performed at [facility], [address] [place], [place] [address]    Culture  Setup Time   Final    GRAM NEGATIVE RODS BOTTLES DRAWN AEROBIC ONLY CRITICAL RESULT CALLED TO, READ BACK BY AND VERIFIED WITH: PHARMD E. JACKSON 01/02/23 @ 0025 BY AB    Culture (A)  Final    ESCHERICHIA COLI SUSCEPTIBILITIES TO FOLLOW Performed at [facility], [address], [place], [place] [address]    Report Status PENDING  Incomplete  Blood culture (routine x 2)      Status: None (Preliminary result)   Collection Time: 12/31/22 11:00 PM   Specimen: BLOOD  Result Value Ref Range Status   Specimen Description   Final    BLOOD SITE NOT SPECIFIED Performed at [facility], [address] [place], [place] [address]    Special Requests   Final    BOTTLES DRAWN AEROBIC AND ANAEROBIC Blood Culture adequate volume Performed at [facility], [address], [place], [place] [address]    Culture   Final    NO GROWTH 2 DAYS Performed at [facility], [address], [place], [place] [address]    Report Status PENDING  Incomplete  Blood Culture ID Panel (Reflexed)     Status: Abnormal   Collection Time: 12/31/22 11:00 PM  Result Value Ref Range Status   Enterococcus faecalis NOT DETECTED NOT DETECTED Final   Enterococcus Faecium NOT DETECTED NOT DETECTED Final   Listeria monocytogenes NOT DETECTED NOT DETECTED Final   Staphylococcus species NOT DETECTED NOT DETECTED Final   Staphylococcus aureus (BCID) NOT DETECTED NOT DETECTED Final   Staphylococcus epidermidis NOT DETECTED NOT DETECTED Final   Staphylococcus lugdunensis NOT DETECTED NOT DETECTED Final   Streptococcus species NOT DETECTED NOT DETECTED Final   Streptococcus agalactiae NOT DETECTED NOT DETECTED Final   Streptococcus pneumoniae NOT DETECTED NOT DETECTED Final   Streptococcus pyogenes NOT DETECTED NOT DETECTED Final   A.calcoaceticus-baumannii NOT DETECTED NOT DETECTED Final   Bacteroides fragilis NOT DETECTED NOT DETECTED Final   Enterobacterales DETECTED (A) NOT DETECTED Final    Comment: Enterobacterales represent a large order of gram negative bacteria, not a single organism. CRITICAL RESULT CALLED TO, READ BACK BY AND VERIFIED WITH: PHARMD E. JACKSON 01/02/23 @ 0025 BY AB    Enterobacter cloacae complex NOT DETECTED NOT DETECTED Final   Escherichia coli DETECTED (A) NOT DETECTED Final    Comment: CRITICAL RESULT CALLED TO, READ BACK BY  AND VERIFIED WITH: PHARMD E. JACKSON 01/02/23 @ 0025 BY AB    Klebsiella aerogenes NOT DETECTED NOT DETECTED Final   Klebsiella oxytoca NOT DETECTED NOT DETECTED Final  Klebsiella pneumoniae NOT DETECTED NOT DETECTED Final   Proteus species NOT DETECTED NOT DETECTED Final   Salmonella species NOT DETECTED NOT DETECTED Final   Serratia marcescens NOT DETECTED NOT DETECTED Final   Haemophilus influenzae NOT DETECTED NOT DETECTED Final   Neisseria meningitidis NOT DETECTED NOT DETECTED Final   Pseudomonas aeruginosa NOT DETECTED NOT DETECTED Final   Stenotrophomonas maltophilia NOT DETECTED NOT DETECTED Final   Candida albicans NOT DETECTED NOT DETECTED Final   Candida auris NOT DETECTED NOT DETECTED Final   Candida glabrata NOT DETECTED NOT DETECTED Final   Candida krusei NOT DETECTED NOT DETECTED Final   Candida parapsilosis NOT DETECTED NOT DETECTED Final   Candida tropicalis NOT DETECTED NOT DETECTED Final   Cryptococcus neoformans/gattii NOT DETECTED NOT DETECTED Final   CTX-M ESBL NOT DETECTED NOT DETECTED Final   Carbapenem resistance IMP NOT DETECTED NOT DETECTED Final   Carbapenem resistance KPC NOT DETECTED NOT DETECTED Final   Carbapenem resistance NDM NOT DETECTED NOT DETECTED Final   Carbapenem resist OXA 48 LIKE NOT DETECTED NOT DETECTED Final   Carbapenem resistance VIM NOT DETECTED NOT DETECTED Final    Comment: Performed at [facility], [address], [place], [place] [address]    Radiology Studies:  No results found.  Scheduled Meds:    ibuprofen  800 mg Oral TID   lidocaine  1 patch Transdermal Q24H   pantoprazole  40 mg Oral Daily   topiramate  100 mg Oral QHS    Continuous Infusions:    cefTRIAXone (ROCEPHIN)  IV 2 g (01/02/23 2139)     LOS: 3 days     Marcellus Scott, MD,  FACP, [facility], [facility], [facility], [facility]   [facility] [place]     To contact the attending provider between 7A-7P or the covering  provider during after hours 7P-7A, please log into the web site [email or web address] and access using universal  password for that web site. If you do not have the password, please call the hospital operator.  01/03/2023, 3:12 PM

## 2023-01-04 DIAGNOSIS — N12 Tubulo-interstitial nephritis, not specified as acute or chronic: Secondary | ICD-10-CM | POA: Diagnosis not present

## 2023-01-04 DIAGNOSIS — R7881 Bacteremia: Secondary | ICD-10-CM | POA: Diagnosis not present

## 2023-01-04 LAB — BASIC METABOLIC PANEL
Anion gap: 7 (ref 5–15)
BUN: 9 mg/dL (ref 6–20)
CO2: 20 mmol/L — ABNORMAL LOW (ref 22–32)
Calcium: 8.2 mg/dL — ABNORMAL LOW (ref 8.9–10.3)
Chloride: 108 mmol/L (ref 98–111)
Creatinine, Ser: 0.81 mg/dL (ref 0.44–1.00)
GFR, Estimated: >60 mL/min (ref >60)
Glucose, Bld: 129 mg/dL — ABNORMAL HIGH (ref 70–99)
Potassium: 3.3 mmol/L — ABNORMAL LOW (ref 3.5–5.1)
Sodium: 135 mmol/L (ref 135–145)

## 2023-01-04 LAB — CBC
HCT: 30.6 % — ABNORMAL LOW (ref 36.0–46.0)
Hemoglobin: 9.6 g/dL — ABNORMAL LOW (ref 12.0–15.0)
MCH: 24.4 pg — ABNORMAL LOW (ref 26.0–34.0)
MCHC: 31.4 g/dL (ref 30.0–36.0)
MCV: 77.9 fL — ABNORMAL LOW (ref 80.0–100.0)
Platelets: 229 10*3/uL (ref 150–400)
RBC: 3.93 MIL/uL (ref 3.87–5.11)
RDW: 15.9 % — ABNORMAL HIGH (ref 11.5–15.5)
WBC: 9 10*3/uL (ref 4.0–10.5)
nRBC: 0 % (ref 0.0–0.2)

## 2023-01-04 LAB — CULTURE, BLOOD (ROUTINE X 2)

## 2023-01-04 LAB — MAGNESIUM: Magnesium: 2.1 mg/dL (ref 1.7–2.4)

## 2023-01-04 MED ORDER — POTASSIUM CHLORIDE CRYS ER 20 MEQ PO TBCR
30.0000 meq | EXTENDED_RELEASE_TABLET | ORAL | Status: AC
  Administered 2023-01-04 (×2): 30 meq via ORAL
  Filled 2023-01-04 (×2): qty 1

## 2023-01-04 MED ORDER — BISACODYL 5 MG PO TBEC
5.0000 mg | DELAYED_RELEASE_TABLET | Freq: Once | ORAL | Status: AC
  Administered 2023-01-04: 5 mg via ORAL
  Filled 2023-01-04: qty 1

## 2023-01-04 MED ORDER — CEFAZOLIN SODIUM-DEXTROSE 2-4 GM/100ML-% IV SOLN
2.0000 g | Freq: Three times a day (TID) | INTRAVENOUS | Status: DC
  Administered 2023-01-04 – 2023-01-05 (×4): 2 g via INTRAVENOUS
  Filled 2023-01-04 (×4): qty 100

## 2023-01-04 NOTE — Progress Notes (Signed)
PROGRESS NOTE   TALECIA GREUNKE  ZOX:[id number]    DOB: 1965/09/28    DOA: 12/31/2022  PCP: Courtney Paris, NP   I have briefly reviewed patients previous medical records in [facility].  Chief Complaint  Patient presents with   Flank Pain    Brief Narrative:  57 year old female with PMH of GERD, obesity, pyelonephritis (09/16/2021), anemia of chronic disease, presented to the ED with 3 days history of subacute onset of right flank pain, intermittent nausea, subjective fever and chills but no dysuria.  Report symptoms similar to prior episode of pyelonephritis.  Seen at urgent care, noted to have soft blood pressures, advised ED visit.  Admitted for sepsis due to E. coli acute right pyelonephritis and bacteremia.  Slow to improve.   Assessment & Plan:  Principal Problem:   Pyelonephritis of right kidney Active Problems:   Severe sepsis (HCC)   AKI (acute kidney injury) (HCC)   Right flank pain   Generalized weakness   Essential hypertension   Anemia of chronic disease   Sepsis due to E. coli acute right pyelonephritis and bacteremia - Met criteria by sepsis 3 guidelines.  Sofa score of 3 (creatinine 1.28, total bilirubin 2). - Urine microscopy showed many bacteria and 21-50 WBCs per high-power field. - Despite urine microscopy suggestive of UTI, interestingly patient did not have any urinary symptoms but presented with right flank pain similar to prior episodes of pyelonephritis.   - Elevated lactate, peaked to 3.6 which has since resolved. - S/p 3.5 l IV fluid bolus, continued on maintenance IV fluids.  Now developing peripheral edema, discontinued IVF. - Urine and blood culture culture confirms pansensitive E. coli.  Completed 4 days of IV ceftriaxone.  Switched to IV cefazolin 6/30.  Continue IV antibiotics while hospitalized and changed to oral antibiotics to complete total 10 days course. - Multimodality pain control for right flank pain which seems out of proportion to her  pyelonephritis diagnosis: Continue lidocaine patch and as needed Robaxin.  Reduced dose Oxy IR, added 2 days of scheduled ibuprofen yesterday.  Right flank pain slowly improving. - CT A/P on admission had shown punctate bilateral nephrolithiasis.  No ureteral stones or hydronephrosis, no other acute findings that would explain her ongoing pain despite appropriate treatment for pyelonephritis.  CTA chest had also shown no acute abnormalities in the chest wall soft tissue or skeleton.  No skin rash suggestive of herpes.  Moreover her signs and symptoms not suggestive of other etiology such as spinous process.  Acute kidney injury/NAGMA: - Most recent creatinine in March 2023 was in the 0.6-0.8 range.  Presented with creatinine of 1.28 which is more than 0.3 from baseline. - Treated with aggressive IV fluid hydration since admission and AKI resolved.  Creatinine down to 0.8. - Continue to hold HCTZ.  Avoid nephrotoxics as much as possible.  Microcytic anemia: - Initially appears to have come and hemoconcentrated with hemoglobin of 11.1.  Post hydration, this has dropped to 9.3 which is her baseline from 2023.  B12 1031. - Stable.  Hypokalemia: Recurrent.  Continue to replace aggressively and follow.  Magnesium 2.1.  GERD: - PPI initiated.  Constipation: - Bowel regimen.  P.o. Dulcolax today.  Body mass index is 44.01 kg/m./Morbid obesity: Outpatient follow-up.   ACP Documents: None present DVT prophylaxis: SCDs Start: 12/31/22 2245     Code Status: Full Code:  Family Communication: None at bedside. Disposition:  Status is: Inpatient Remains inpatient appropriate because: IV antibiotics.  Possible DC  in 24 to 48 hours.     Consultants:     Procedures:     Antimicrobials:   IV ceftriaxone.   Subjective:  Patient interviewed and examined along with her female RN in room.  Reports that overall she feels 50% better than on admission and her right flank pain is also 50% better.   Has not had a BM since hospital admission.  Agreeable to oral Dulcolax.  Intermittent mild nausea but tolerating diet.  Objective:   Vitals:   01/03/23 2116 01/04/23 0500 01/04/23 0541 01/04/23 1406  BP: 108/70  (!) 104/57 101/63  Pulse: 71  72 71  Resp: 17  17 16   Temp: 98.1 F (36.7 C)  97.7 F (36.5 C) (!) 97.1 F (36.2 C)  TempSrc: Oral  Oral   SpO2: 97%  99% 100%  Weight:  116.3 kg    Height:        General exam: Young female, moderately built and obese, lying comfortably in bed.  Oral mucosa moist.  Does not appear in any distress. Respiratory system: Clear to auscultation.  No increased work of breathing. Cardiovascular system: S1 & S2 heard, RRR. No JVD, murmurs, rubs, gallops or clicks. No pedal edema.  Off of telemetry. Gastrointestinal system: Abdomen is nondistended, soft and nontender. No organomegaly or masses felt. Normal bowel sounds heard. GU: Right CVA tenderness minimal.  No rash around right flank. Central nervous system: Alert and oriented x 3. No focal neurological deficits. Extremities: Symmetric 5 x 5 power. Skin: No rashes, lesions or ulcers Psychiatry: Judgement and insight appear intact. Mood & affect appropriate.     Data Reviewed:   I have personally reviewed following labs and imaging studies   CBC: Recent Labs  Lab 01/01/23 0423 01/02/23 0421 01/04/23 0417  WBC 9.3 9.0 9.0  NEUTROABS 7.6  --   --   HGB 9.3* 9.8* 9.6*  HCT 29.1* 30.8* 30.6*  MCV 78.0* 78.8* 77.9*  PLT 183 183 229    Basic Metabolic Panel: Recent Labs  Lab 12/31/22 1820 01/01/23 0423 01/02/23 0421 01/03/23 0453 01/04/23 0417  NA 137 138 134* 131* 135  K 3.5 3.3* 3.6 3.3* 3.3*  CL 113* 111 111 105 108  CO2 16* 18* 17* 18* 20*  GLUCOSE 114* 89 104* 92 129*  BUN 15 15 12 10 9   CREATININE 1.28* 1.03* 0.80 0.89 0.81  CALCIUM 7.7* 8.1* 8.2* 8.0* 8.2*  MG  --  1.9  --   --  2.1    Liver Function Tests: Recent Labs  Lab 12/31/22 1820 01/01/23 0423  AST 39  28  ALT 19 21  ALKPHOS 78 68  BILITOT 2.0* 1.0  PROT 6.2* 5.6*  ALBUMIN 3.0* 2.6*    CBG: Recent Labs  Lab 12/31/22 2357 01/03/23 1149  GLUCAP 102* 106*    Microbiology Studies:   Recent Results (from the past 240 hour(s))  Urine Culture     Status: Abnormal   Collection Time: 12/31/22  9:59 PM   Specimen: Urine, Clean Catch  Result Value Ref Range Status   Specimen Description   Final    URINE, CLEAN CATCH Performed at [facility], [address] [place], [place] [address]    Special Requests   Final    NONE Performed at [facility], [address] [place], [place] [address]    Culture >=100,000 COLONIES/mL ESCHERICHIA COLI (A)  Final   Report Status 01/03/2023 FINAL  Final   Organism  ID, Bacteria ESCHERICHIA COLI (A)  Final      Susceptibility   Escherichia coli - MIC*    AMPICILLIN 4 SENSITIVE Sensitive     CEFAZOLIN <=4 SENSITIVE Sensitive     CEFEPIME <=0.12 SENSITIVE Sensitive     CEFTRIAXONE <=0.25 SENSITIVE Sensitive     CIPROFLOXACIN <=0.25 SENSITIVE Sensitive     GENTAMICIN <=1 SENSITIVE Sensitive     IMIPENEM <=0.25 SENSITIVE Sensitive     NITROFURANTOIN <=16 SENSITIVE Sensitive     TRIMETH/SULFA <=20 SENSITIVE Sensitive     AMPICILLIN/SULBACTAM <=2 SENSITIVE Sensitive     PIP/TAZO <=4 SENSITIVE Sensitive     * >=100,000 COLONIES/mL ESCHERICHIA COLI  Blood culture (routine x 2)     Status: Abnormal (Preliminary result)   Collection Time: 12/31/22 11:00 PM   Specimen: BLOOD  Result Value Ref Range Status   Specimen Description   Final    BLOOD SITE NOT SPECIFIED Performed at [facility], [address] [place], [place] [address]    Special Requests   Final    BOTTLES DRAWN AEROBIC ONLY Blood Culture adequate volume Performed at [facility] [address], [place], [place] [address]    Culture  Setup Time   Final    GRAM NEGATIVE RODS BOTTLES DRAWN AEROBIC  ONLY CRITICAL RESULT CALLED TO, READ BACK BY AND VERIFIED WITH: PHARMD E. JACKSON 01/02/23 @ 0025 BY AB Performed at [facility], [address], [place], [place] [address]    Culture ESCHERICHIA COLI (A)  Final   Report Status PENDING  Incomplete   Organism ID, Bacteria ESCHERICHIA COLI  Final      Susceptibility   Escherichia coli - MIC*    AMPICILLIN 4 SENSITIVE Sensitive     CEFEPIME <=0.12 SENSITIVE Sensitive     CEFTAZIDIME <=1 SENSITIVE Sensitive     CEFTRIAXONE <=0.25 SENSITIVE Sensitive     CIPROFLOXACIN <=0.25 SENSITIVE Sensitive     GENTAMICIN <=1 SENSITIVE Sensitive     IMIPENEM <=0.25 SENSITIVE Sensitive     TRIMETH/SULFA <=20 SENSITIVE Sensitive     AMPICILLIN/SULBACTAM <=2 SENSITIVE Sensitive     PIP/TAZO <=4 SENSITIVE Sensitive     * ESCHERICHIA COLI  Blood culture (routine x 2)     Status: None (Preliminary result)   Collection Time: 12/31/22 11:00 PM   Specimen: BLOOD  Result Value Ref Range Status   Specimen Description   Final    BLOOD SITE NOT SPECIFIED Performed at [facility], [address], [place], [place] [address]    Special Requests   Final    BOTTLES DRAWN AEROBIC AND ANAEROBIC Blood Culture adequate volume Performed at [facility], [address] [place], [place] [address]    Culture   Final    NO GROWTH 3 DAYS Performed at [facility], [address], [place], [place] [address]    Report Status PENDING  Incomplete  Blood Culture ID Panel (Reflexed)     Status: Abnormal   Collection Time: 12/31/22 11:00 PM  Result Value Ref Range Status   Enterococcus faecalis NOT DETECTED NOT DETECTED Final   Enterococcus Faecium NOT DETECTED NOT DETECTED Final   Listeria monocytogenes NOT DETECTED NOT DETECTED Final   Staphylococcus species NOT DETECTED NOT DETECTED Final   Staphylococcus aureus (BCID) NOT DETECTED NOT DETECTED Final   Staphylococcus epidermidis NOT DETECTED NOT DETECTED Final    Staphylococcus lugdunensis NOT DETECTED NOT DETECTED Final   Streptococcus species  NOT DETECTED NOT DETECTED Final   Streptococcus agalactiae NOT DETECTED NOT DETECTED Final   Streptococcus pneumoniae NOT DETECTED NOT DETECTED Final   Streptococcus pyogenes NOT DETECTED NOT DETECTED Final   A.calcoaceticus-baumannii NOT DETECTED NOT DETECTED Final   Bacteroides fragilis NOT DETECTED NOT DETECTED Final   Enterobacterales DETECTED (A) NOT DETECTED Final    Comment: Enterobacterales represent a large order of gram negative bacteria, not a single organism. CRITICAL RESULT CALLED TO, READ BACK BY AND VERIFIED WITH: PHARMD E. JACKSON 01/02/23 @ 0025 BY AB    Enterobacter cloacae complex NOT DETECTED NOT DETECTED Final   Escherichia coli DETECTED (A) NOT DETECTED Final    Comment: CRITICAL RESULT CALLED TO, READ BACK BY AND VERIFIED WITH: PHARMD E. JACKSON 01/02/23 @ 0025 BY AB    Klebsiella aerogenes NOT DETECTED NOT DETECTED Final   Klebsiella oxytoca NOT DETECTED NOT DETECTED Final   Klebsiella pneumoniae NOT DETECTED NOT DETECTED Final   Proteus species NOT DETECTED NOT DETECTED Final   Salmonella species NOT DETECTED NOT DETECTED Final   Serratia marcescens NOT DETECTED NOT DETECTED Final   Haemophilus influenzae NOT DETECTED NOT DETECTED Final   Neisseria meningitidis NOT DETECTED NOT DETECTED Final   Pseudomonas aeruginosa NOT DETECTED NOT DETECTED Final   Stenotrophomonas maltophilia NOT DETECTED NOT DETECTED Final   Candida albicans NOT DETECTED NOT DETECTED Final   Candida auris NOT DETECTED NOT DETECTED Final   Candida glabrata NOT DETECTED NOT DETECTED Final   Candida krusei NOT DETECTED NOT DETECTED Final   Candida parapsilosis NOT DETECTED NOT DETECTED Final   Candida tropicalis NOT DETECTED NOT DETECTED Final   Cryptococcus neoformans/gattii NOT DETECTED NOT DETECTED Final   CTX-M ESBL NOT DETECTED NOT DETECTED Final   Carbapenem resistance IMP NOT DETECTED NOT DETECTED Final    Carbapenem resistance KPC NOT DETECTED NOT DETECTED Final   Carbapenem resistance NDM NOT DETECTED NOT DETECTED Final   Carbapenem resist OXA 48 LIKE NOT DETECTED NOT DETECTED Final   Carbapenem resistance VIM NOT DETECTED NOT DETECTED Final    Comment: Performed at [facility], [address] [place], [place] [address]    Radiology Studies:  No results found.  Scheduled Meds:    ibuprofen  800 mg Oral TID   lidocaine  1 patch Transdermal Q24H   pantoprazole  40 mg Oral Daily   topiramate  100 mg Oral QHS    Continuous Infusions:     ceFAZolin (ANCEF) IV 2 g (01/04/23 1403)     LOS: 4 days     Marcellus Scott, MD,  FACP, FHM, SFHM, [facility], [facility]   [facility] Ad[facility] [place]     To contact the attending provider between 7A-7P or the covering provider during after hours 7P-7A, please log into the web site [email or web address] and access using universal [place] password for that web site. If you do not have the password, please call the hospital operator.  01/04/2023, 3:25 PM

## 2023-01-04 NOTE — Plan of Care (Signed)
Patient awake, robaxin given for right flank pain, see MAR. Patient verbalizes pain is going down. Ambulation to bathroom with walker x1 assistance is tolerated well. VS stable and within baseline. Continue safety precautions.   Problem: Education: Goal: Knowledge of General Education information will improve Description: Including pain rating scale, medication(s)/side effects and non-pharmacologic comfort measures Outcome: Progressing   Problem: Clinical Measurements: Goal: Will remain free from infection Outcome: Progressing   Problem: Activity: Goal: Risk for activity intolerance will decrease Outcome: Progressing   Problem: Nutrition: Goal: Adequate nutrition will be maintained Outcome: Progressing   Problem: Pain Managment: Goal: General experience of comfort will improve Outcome: Progressing   Problem: Safety: Goal: Ability to remain free from injury will improve Outcome: Progressing

## 2023-01-04 NOTE — Plan of Care (Signed)
Received patient in bed awake, alert orient x4. Remains RA. Pain managed with PRN oxycodone and schedule ibuprofen. Patient up ambulating in hallway this shift. Noted with poor appetite; patient encouraged to eat. Voiding amber colored urine. Daughter at bedside. Safety precautions maintained. Problem: Education: Goal: Knowledge of General Education information will improve Description: Including pain rating scale, medication(s)/side effects and non-pharmacologic comfort measures 01/04/2023 1818 by Deneen Harts, RN Outcome: Progressing 01/04/2023 1817 by Deneen Harts, RN Outcome: Progressing   Problem: Health Behavior/Discharge Planning: Goal: Ability to manage health-related needs will improve 01/04/2023 1818 by Deneen Harts, RN Outcome: Progressing 01/04/2023 1817 by Deneen Harts, RN Outcome: Progressing   Problem: Clinical Measurements: Goal: Ability to maintain clinical measurements within normal limits will improve 01/04/2023 1818 by Deneen Harts, RN Outcome: Progressing 01/04/2023 1817 by Deneen Harts, RN Outcome: Progressing Goal: Will remain free from infection 01/04/2023 1818 by Deneen Harts, RN Outcome: Progressing 01/04/2023 1817 by Deneen Harts, RN Outcome: Progressing Goal: Diagnostic test results will improve 01/04/2023 1818 by Deneen Harts, RN Outcome: Progressing 01/04/2023 1817 by Deneen Harts, RN Outcome: Progressing Goal: Respiratory complications will improve 01/04/2023 1818 by Deneen Harts, RN Outcome: Progressing 01/04/2023 1817 by Deneen Harts, RN Outcome: Progressing Goal: Cardiovascular complication will be avoided 01/04/2023 1818 by Deneen Harts, RN Outcome: Progressing 01/04/2023 1817 by Deneen Harts, RN Outcome: Progressing   Problem: Activity: Goal: Risk for activity intolerance will decrease 01/04/2023 1818 by Deneen Harts, RN Outcome:  Progressing 01/04/2023 1817 by Deneen Harts, RN Outcome: Progressing   Problem: Nutrition: Goal: Adequate nutrition will be maintained 01/04/2023 1818 by Deneen Harts, RN Outcome: Progressing 01/04/2023 1817 by Deneen Harts, RN Outcome: Progressing   Problem: Coping: Goal: Level of anxiety will decrease 01/04/2023 1818 by Deneen Harts, RN Outcome: Progressing 01/04/2023 1817 by Deneen Harts, RN Outcome: Progressing   Problem: Elimination: Goal: Will not experience complications related to bowel motility 01/04/2023 1818 by Deneen Harts, RN Outcome: Progressing 01/04/2023 1817 by Deneen Harts, RN Outcome: Progressing Goal: Will not experience complications related to urinary retention 01/04/2023 1818 by Deneen Harts, RN Outcome: Progressing 01/04/2023 1817 by Deneen Harts, RN Outcome: Progressing   Problem: Pain Managment: Goal: General experience of comfort will improve 01/04/2023 1818 by Deneen Harts, RN Outcome: Progressing 01/04/2023 1817 by Deneen Harts, RN Outcome: Progressing   Problem: Safety: Goal: Ability to remain free from injury will improve 01/04/2023 1818 by Deneen Harts, RN Outcome: Progressing 01/04/2023 1817 by Deneen Harts, RN Outcome: Progressing   Problem: Skin Integrity: Goal: Risk for impaired skin integrity will decrease 01/04/2023 1818 by Deneen Harts, RN Outcome: Progressing 01/04/2023 1817 by Deneen Harts, RN Outcome: Progressing

## 2023-01-05 DIAGNOSIS — N12 Tubulo-interstitial nephritis, not specified as acute or chronic: Secondary | ICD-10-CM | POA: Diagnosis not present

## 2023-01-05 LAB — CBC
HCT: 30.5 % — ABNORMAL LOW (ref 36.0–46.0)
Hemoglobin: 9.6 g/dL — ABNORMAL LOW (ref 12.0–15.0)
MCH: 24.6 pg — ABNORMAL LOW (ref 26.0–34.0)
MCHC: 31.5 g/dL (ref 30.0–36.0)
MCV: 78 fL — ABNORMAL LOW (ref 80.0–100.0)
Platelets: 270 10*3/uL (ref 150–400)
RBC: 3.91 MIL/uL (ref 3.87–5.11)
RDW: 15.9 % — ABNORMAL HIGH (ref 11.5–15.5)
WBC: 7.3 10*3/uL (ref 4.0–10.5)
nRBC: 0 % (ref 0.0–0.2)

## 2023-01-05 LAB — BASIC METABOLIC PANEL
Anion gap: 9 (ref 5–15)
BUN: 7 mg/dL (ref 6–20)
CO2: 18 mmol/L — ABNORMAL LOW (ref 22–32)
Calcium: 8.2 mg/dL — ABNORMAL LOW (ref 8.9–10.3)
Chloride: 111 mmol/L (ref 98–111)
Creatinine, Ser: 0.89 mg/dL (ref 0.44–1.00)
GFR, Estimated: >60 mL/min (ref >60)
Glucose, Bld: 124 mg/dL — ABNORMAL HIGH (ref 70–99)
Potassium: 3.8 mmol/L (ref 3.5–5.1)
Sodium: 138 mmol/L (ref 135–145)

## 2023-01-05 LAB — CULTURE, BLOOD (ROUTINE X 2)

## 2023-01-05 MED ORDER — CEFADROXIL 500 MG PO CAPS: 1000.0000 mg | ORAL_CAPSULE | Freq: Two times a day (BID) | ORAL | 0 refills | Status: AC

## 2023-01-05 MED ORDER — OXYCODONE HCL 5 MG PO TABS: 5.0000 mg | ORAL_TABLET | Freq: Four times a day (QID) | ORAL | 0 refills | Status: AC | PRN

## 2023-01-05 MED ORDER — ONDANSETRON 4 MG PO TBDP: 4.0000 mg | ORAL_TABLET | Freq: Three times a day (TID) | ORAL | 0 refills | Status: AC | PRN

## 2023-01-05 NOTE — Progress Notes (Signed)
Physical Therapy Treatment Patient Details Name: Connie Mcneil MRN: [id number] DOB: 12/09/1965 Today's Date: 01/05/2023   History of Present Illness 57 y.o. female with medical history significant for pyelonephritis in 2023, essential pretension, anemia of chronic disease associated baseline hemoglobin 9-12, who is admitted to [facility] on 12/31/2022 with severe sepsis due to right-sided pyelonephritis    PT Comments  Pt able to increase gait distance to 200'. She declines use of AD and gait pattern did improve throughout session.  Pt progressing well and has d/c order to return home today.  She reports she is fatigued but feels that she will be able to manage at home with intermittent assist from family.      Assistance Recommended at Discharge PRN  If plan is discharge home, recommend the following:  Can travel by private vehicle    A little help with walking and/or transfers;A little help with bathing/dressing/bathroom;Assistance with cooking/housework;Help with stairs or ramp for entrance      Equipment Recommendations  Other (comment) (declined RW)    Recommendations for Other Services       Precautions / Restrictions Precautions Precautions: Fall     Mobility  Bed Mobility Overal bed mobility: Modified Independent Bed Mobility: Supine to Sit, Sit to Supine     Supine to sit: Modified independent (Device/Increase time), HOB elevated Sit to supine: Modified independent (Device/Increase time)   General bed mobility comments: required increased time and effort due to back pain but no assist    Transfers Overall transfer level: Needs assistance Equipment used: None Transfers: Sit to/from Stand Sit to Stand: Supervision           General transfer comment: Close supervision for safety; declined use of AD    Ambulation/Gait Ambulation/Gait assistance: Min guard, Supervision Gait Distance (Feet): 200 Feet Assistive device: None Gait  Pattern/deviations: Step-to pattern, Decreased weight shift to right, Wide base of support Gait velocity: decr     General Gait Details: Wide BOS with increased lateral weight shifting.  Does report R back pain and has decreased weight shift to R.  Pt started with min guard progressed to close supervision.  Gait pattern initially with very small steps, more lateral weight shift, and reaching for R UE support of rails or bed. Pt declined use of RW or cane, reports "I just have to get used to it."  After ~75' pattern began to improve gait pattern and able to walk without UE support   Stairs Stairs:  (Pt has a few steps to enter daughter's house.  Declined trying stairs.  Feels she will be able to do with rails and daughter's assistance)           Wheelchair Mobility     Tilt Bed    Modified Rankin (Stroke Patients Only)       Balance Overall balance assessment: Needs assistance Sitting-balance support: No upper extremity supported Sitting balance-Leahy Scale: Good     Standing balance support: No upper extremity supported Standing balance-Leahy Scale: Fair                              Cognition Arousal/Alertness: Awake/alert Behavior During Therapy: Flat affect Overall Cognitive Status: Within Functional Limits for tasks assessed  Exercises      General Comments General comments (skin integrity, edema, etc.): VSS.  Pt reports feeling comfortable with her ability to return home with intermittent assist from family.  She declined use of RW for here or home.      Pertinent Vitals/Pain Pain Assessment Pain Assessment: 0-10 Pain Score: 7  Pain Location: R back (reports from kidneys) Pain Descriptors / Indicators: Sore, Discomfort Pain Intervention(s): Limited activity within patient's tolerance, Monitored during session    Home Living                          Prior Function             PT Goals (current goals can now be found in the care plan section) Progress towards PT goals: Progressing toward goals    Frequency    Min 1X/week      PT Plan Current plan remains appropriate    Co-evaluation              AM-PAC PT "6 Clicks" Mobility   Outcome Measure  Help needed turning from your back to your side while in a flat bed without using bedrails?: None Help needed moving from lying on your back to sitting on the side of a flat bed without using bedrails?: A Little Help needed moving to and from a bed to a chair (including a wheelchair)?: A Little Help needed standing up from a chair using your arms (e.g., wheelchair or bedside chair)?: A Little Help needed to walk in hospital room?: A Little Help needed climbing 3-5 steps with a railing? : A Little 6 Click Score: 19    End of Session   Activity Tolerance: Patient limited by fatigue (Tolerated walk well but declined further due to leaving later and needing to safe energy) Patient left: in bed;with call bell/phone within reach;with bed alarm set Nurse Communication: Mobility status PT Visit Diagnosis: Difficulty in walking, not elsewhere classified (R26.2)     Time: 1610-9604 PT Time Calculation (min) (ACUTE ONLY): 13 min  Charges:    $Gait Training: 8-22 mins PT General Charges $$ ACUTE PT VISIT: 1 Visit                     Anise Salvo, PT Acute Rehab Services [facility] [phone]    Rayetta Humphrey 01/05/2023, 2:28 PM

## 2023-01-05 NOTE — Discharge Summary (Signed)
Physician Discharge Summary   Patient: Connie Mcneil MRN: [id number] DOB: Nov 11, 1965  Admit date:     12/31/2022  Discharge date: 01/05/23  Discharge Physician: Tyrone Nine   PCP: Courtney Paris, NP   Recommendations at discharge:  Follow up with PCP in 1-2 weeks. Suggest monitoring BMP and CBC.   Discharge Diagnoses: Principal Problem:   Pyelonephritis of right kidney Active Problems:   Severe sepsis (HCC)   AKI (acute kidney injury) (HCC) ruled out   Right flank pain   Generalized weakness   Essential hypertension   Anemia of chronic disease  Hospital Course: Connie Mcneil is a 57 year old female with PMH of GERD, obesity, pyelonephritis (09/16/2021), anemia of chronic disease, presented to the ED with 3 days history of subacute onset of right flank pain, intermittent nausea, subjective fever and chills but no dysuria.  Report symptoms similar to prior episode of pyelonephritis.  Seen at urgent care, noted to have soft blood pressures, advised ED visit.  Admitted for sepsis due to E. coli acute right pyelonephritis and bacteremia. She has slowly improved with antibiotics which will be continued based on culture susceptibilities.   Assessment and Plan: Sepsis due to E. coli bacteremia, UTI:  - Met criteria by sepsis 3 guidelines.  Sofa score of 3 (creatinine 1.28, total bilirubin 2). - Urine microscopy showed many bacteria and 21-50 WBCs per high-power field. - Despite urine microscopy suggestive of UTI, interestingly patient did not have any urinary symptoms but presented with right flank pain similar to prior episodes of pyelonephritis.   - CT A/P on admission had shown punctate bilateral nephrolithiasis.  No ureteral stones or hydronephrosis, no other acute findings that would explain her ongoing pain despite appropriate treatment for pyelonephritis.  CTA chest had also shown no acute abnormalities in the chest wall soft tissue or skeleton.  No skin rash suggestive of herpes.   Moreover her signs and symptoms not suggestive of other etiology such as spinous process.  - Complete treatment with cefadroxil 1,000mg  BID based on culture results.  - Continue multimodal pain control. PDMP shows only phentermine, prescribed short course of oxycodone to facilitate discharge.   Microcytic, hypochromic anemia: No gross bleeding, hgb is stable.  - Suggest recheck at follow up and appropriate malignancy screening.  Hypokalemia: Resolved  Hyponatremia: Resolved.  NAGMA: Bicarb stable at 18, suspect this is related to topiramate. Defer further work up at this time in setting of acute infection. Note lactic acidosis POA has resolved.  - Repeat BMP at follow up.   Elevated creatinine: Cr has improved with normalized CrCl, elevation was not significant enough to rise to the diagnosis of AKI.   Prominent main pulmonary artery and central pulmonary vessels suggest pulmonary arterial hypertension by CTA chest:  - Consider echocardiogram after convalescence.   Morbid obesity: Body mass index is 44.84 kg/m.   Consultants: None Procedures performed: None  Disposition: Home Diet recommendation: Regular DISCHARGE MEDICATION: Allergies as of 01/05/2023   No Known Allergies      Medication List     TAKE these medications    cefadroxil 500 MG capsule Commonly known as: DURICEF Take 2 capsules (1,000 mg total) by mouth 2 (two) times daily for 3 days.   hydrochlorothiazide 50 MG tablet Commonly known as: HYDRODIURIL Take 50 mg by mouth daily as needed.   naproxen sodium 220 MG tablet Commonly known as: ALEVE Take 220 mg by mouth daily as needed (pain).   ondansetron 4 MG disintegrating tablet Commonly known  as: ZOFRAN-ODT Take 1 tablet (4 mg total) by mouth every 8 (eight) hours as needed for nausea or vomiting.   oxyCODONE 5 MG immediate release tablet Commonly known as: Oxy IR/ROXICODONE Take 1 tablet (5 mg total) by mouth every 6 (six) hours as needed for up to 5  days for severe pain.   phentermine 37.5 MG capsule Take 1 capsule (37.5 mg total) by mouth every morning.   topiramate 100 MG tablet Commonly known as: Topamax Take 1 tablet (100 mg total) by mouth daily. What changed: when to take this   Vitamin D (Ergocalciferol) 1.25 MG (50000 UNIT) Caps capsule Commonly known as: DRISDOL Take 50,000 Units by mouth once a week.        Follow-up Information     Courtney Paris, NP Follow up.   Specialty: Nurse Practitioner Contact information: [address] [place] [address] [phone]                Discharge Exam: Filed Weights   01/03/23 0610 01/04/23 0500 01/05/23 9147  Weight: 114 kg 116.3 kg 118.5 kg  BP 102/60 (BP Location: Right Arm)   Pulse 72   Temp 98.5 F (36.9 C) (Oral)   Resp 18   Ht 5\' 4"  (1.626 m)   Wt 118.5 kg   SpO2 100%   BMI 44.84 kg/m   Obese, tired but well-appearing female in no distress Clear, nonlabored RRR, no MRG or pitting edema Soft, ND, +BS, no masses.  No focal deficits.   Condition at discharge: stable  The results of significant diagnostics from this hospitalization (including imaging, microbiology, ancillary and laboratory) are listed below for reference.   Imaging Studies: CT ABDOMEN PELVIS W CONTRAST  Result Date: 12/31/2022 CLINICAL DATA:  Right flank, lower back pain EXAM: CT ABDOMEN AND PELVIS WITH CONTRAST TECHNIQUE: Multidetector CT imaging of the abdomen and pelvis was performed using the standard protocol following bolus administration of intravenous contrast. RADIATION DOSE REDUCTION: This exam was performed according to the departmental dose-optimization program which includes automated exposure control, adjustment of the mA and/or kV according to patient size and/or use of iterative reconstruction technique. CONTRAST:  OMNIPAQUE IOHEXOL 350 MG/ML SOLN COMPARISON:  09/12/2021 FINDINGS: Lower chest: No acute findings Hepatobiliary: Prior cholecystectomy. Mild  intrahepatic and extrahepatic biliary ductal prominence compatible with post cholecystectomy state, stable. Stable 7 mm cyst in the right lobe of the liver, unchanged. Pancreas: No focal abnormality or ductal dilatation. Spleen: No focal abnormality.  Normal size. Adrenals/Urinary Tract: Innumerable bilateral renal cysts, unchanged. No follow-up imaging recommended. Punctate bilateral stones, nonobstructing. No ureteral stones or hydronephrosis. Adrenal glands and urinary bladder unremarkable. Stomach/Bowel: Stomach, large and small bowel grossly unremarkable. Vascular/Lymphatic: No evidence of aneurysm or adenopathy. Reproductive: Prior hysterectomy.  No adnexal masses. Other: No free fluid or free air. Musculoskeletal: No acute bony abnormality. IMPRESSION: 1. Punctate bilateral nephrolithiasis. No ureteral stones or hydronephrosis. 2. No acute findings in the abdomen or pelvis. Electronically Signed   By: Charlett Nose M.D.   On: 12/31/2022 20:43   CT Angio Chest PE W and/or Wo Contrast  Result Date: 12/31/2022 CLINICAL DATA:  Pulmonary embolism (PE) suspected, high prob EXAM: CT ANGIOGRAPHY CHEST WITH CONTRAST TECHNIQUE: Multidetector CT imaging of the chest was performed using the standard protocol during bolus administration of intravenous contrast. Multiplanar CT image reconstructions and MIPs were obtained to evaluate the vascular anatomy. RADIATION DOSE REDUCTION: This exam was performed according to the departmental dose-optimization program which includes automated exposure control,  adjustment of the mA and/or kV according to patient size and/or use of iterative reconstruction technique. CONTRAST:  OMNIPAQUE IOHEXOL 350 MG/ML SOLN COMPARISON:  09/12/2021 FINDINGS: Cardiovascular: No filling defects in the pulmonary arteries to suggest pulmonary emboli. Prominent main pulmonary artery measuring 4 cm with prominent central pulmonary vessels. This suggest pulmonary arterial hypertension. No evidence  of aortic aneurysm. Mediastinum/Nodes: No mediastinal, hilar, or axillary adenopathy. Trachea and esophagus are unremarkable. Thyroid unremarkable. Lungs/Pleura: Lungs are clear. No focal airspace opacities or suspicious nodules. No effusions. Upper Abdomen: No acute findings Musculoskeletal: Chest wall soft tissues are unremarkable. No acute bony abnormality. Review of the MIP images confirms the above findings. IMPRESSION: 1. No evidence of pulmonary embolus. 2. Prominent main pulmonary artery and central pulmonary vessels suggest pulmonary arterial hypertension. 3. No acute cardiopulmonary disease. Electronically Signed   By: Charlett Nose M.D.   On: 12/31/2022 20:41   MM 3D SCREENING MAMMOGRAM BILATERAL BREAST  Result Date: 12/10/2022 CLINICAL DATA:  Screening. EXAM: DIGITAL SCREENING BILATERAL MAMMOGRAM WITH TOMOSYNTHESIS AND CAD TECHNIQUE: Bilateral screening digital craniocaudal and mediolateral oblique mammograms were obtained. Bilateral screening digital breast tomosynthesis was performed. The images were evaluated with computer-aided detection. COMPARISON:  Previous exam(s). ACR Breast Density Category b: There are scattered areas of fibroglandular density. FINDINGS: There are no findings suspicious for malignancy. IMPRESSION: No mammographic evidence of malignancy. A result letter of this screening mammogram will be mailed directly to the patient. RECOMMENDATION: Screening mammogram in one year. (Code:SM-B-01Y) BI-RADS CATEGORY  1: Negative. Electronically Signed   By: Harmon Pier M.D.   On: 12/10/2022 08:10    Microbiology: Results for orders placed or performed during the hospital encounter of 12/31/22  Urine Culture     Status: Abnormal   Collection Time: 12/31/22  9:59 PM   Specimen: Urine, Clean Catch  Result Value Ref Range Status   Specimen Description   Final    URINE, CLEAN CATCH Performed at [facility], [address], [place], [place] [address]    Special  Requests   Final    NONE Performed at [facility], [address] [place], [place] [address]    Culture >=100,000 COLONIES/mL ESCHERICHIA COLI (A)  Final   Report Status 01/03/2023 FINAL  Final   Organism ID, Bacteria ESCHERICHIA COLI (A)  Final      Susceptibility   Escherichia coli - MIC*    AMPICILLIN 4 SENSITIVE Sensitive     CEFAZOLIN <=4 SENSITIVE Sensitive     CEFEPIME <=0.12 SENSITIVE Sensitive     CEFTRIAXONE <=0.25 SENSITIVE Sensitive     CIPROFLOXACIN <=0.25 SENSITIVE Sensitive     GENTAMICIN <=1 SENSITIVE Sensitive     IMIPENEM <=0.25 SENSITIVE Sensitive     NITROFURANTOIN <=16 SENSITIVE Sensitive     TRIMETH/SULFA <=20 SENSITIVE Sensitive     AMPICILLIN/SULBACTAM <=2 SENSITIVE Sensitive     PIP/TAZO <=4 SENSITIVE Sensitive     * >=100,000 COLONIES/mL ESCHERICHIA COLI  Blood culture (routine x 2)     Status: Abnormal (Preliminary result)   Collection Time: 12/31/22 11:00 PM   Specimen: BLOOD  Result Value Ref Range Status   Specimen Description   Final    BLOOD SITE NOT SPECIFIED Performed at [facility], [address], [place], [place] [address]    Special Requests   Final    BOTTLES DRAWN AEROBIC ONLY Blood Culture adequate volume Performed at [facility], [address] [place], [place] [address]  Culture  Setup Time   Final    GRAM NEGATIVE RODS BOTTLES DRAWN AEROBIC ONLY CRITICAL RESULT CALLED TO, READ BACK BY AND VERIFIED WITH: PHARMD E. JACKSON 01/02/23 @ 0025 BY AB Performed at [facility], [address], [place], [place] [address]    Culture ESCHERICHIA COLI (A)  Final   Report Status PENDING  Incomplete   Organism ID, Bacteria ESCHERICHIA COLI  Final      Susceptibility   Escherichia coli - MIC*    AMPICILLIN 4 SENSITIVE Sensitive     CEFEPIME <=0.12 SENSITIVE Sensitive     CEFTAZIDIME <=1 SENSITIVE Sensitive     CEFTRIAXONE <=0.25 SENSITIVE Sensitive     CIPROFLOXACIN <=0.25  SENSITIVE Sensitive     GENTAMICIN <=1 SENSITIVE Sensitive     IMIPENEM <=0.25 SENSITIVE Sensitive     TRIMETH/SULFA <=20 SENSITIVE Sensitive     AMPICILLIN/SULBACTAM <=2 SENSITIVE Sensitive     PIP/TAZO <=4 SENSITIVE Sensitive     * ESCHERICHIA COLI  Blood culture (routine x 2)     Status: None (Preliminary result)   Collection Time: 12/31/22 11:00 PM   Specimen: BLOOD  Result Value Ref Range Status   Specimen Description   Final    BLOOD SITE NOT SPECIFIED Performed at [facility], [address] [place], [place] [address]    Special Requests   Final    BOTTLES DRAWN AEROBIC AND ANAEROBIC Blood Culture adequate volume Performed at [facility], [address] [place], [place] [address]    Culture   Final    NO GROWTH 4 DAYS Performed at [facility], [address] [place], [place] [address]    Report Status PENDING  Incomplete  Blood Culture ID Panel (Reflexed)     Status: Abnormal   Collection Time: 12/31/22 11:00 PM  Result Value Ref Range Status   Enterococcus faecalis NOT DETECTED NOT DETECTED Final   Enterococcus Faecium NOT DETECTED NOT DETECTED Final   Listeria monocytogenes NOT DETECTED NOT DETECTED Final   Staphylococcus species NOT DETECTED NOT DETECTED Final   Staphylococcus aureus (BCID) NOT DETECTED NOT DETECTED Final   Staphylococcus epidermidis NOT DETECTED NOT DETECTED Final   Staphylococcus lugdunensis NOT DETECTED NOT DETECTED Final   Streptococcus species NOT DETECTED NOT DETECTED Final   Streptococcus agalactiae NOT DETECTED NOT DETECTED Final   Streptococcus pneumoniae NOT DETECTED NOT DETECTED Final   Streptococcus pyogenes NOT DETECTED NOT DETECTED Final   A.calcoaceticus-baumannii NOT DETECTED NOT DETECTED Final   Bacteroides fragilis NOT DETECTED NOT DETECTED Final   Enterobacterales DETECTED (A) NOT DETECTED Final    Comment: Enterobacterales represent a large order of gram negative bacteria, not a  single organism. CRITICAL RESULT CALLED TO, READ BACK BY AND VERIFIED WITH: PHARMD E. JACKSON 01/02/23 @ 0025 BY AB    Enterobacter cloacae complex NOT DETECTED NOT DETECTED Final   Escherichia coli DETECTED (A) NOT DETECTED Final    Comment: CRITICAL RESULT CALLED TO, READ BACK BY AND VERIFIED WITH: PHARMD E. JACKSON 01/02/23 @ 0025 BY AB    Klebsiella aerogenes NOT DETECTED NOT DETECTED Final   Klebsiella oxytoca NOT DETECTED NOT DETECTED Final   Klebsiella pneumoniae NOT DETECTED NOT DETECTED Final   Proteus species NOT DETECTED NOT DETECTED Final   Salmonella species NOT DETECTED NOT DETECTED Final   Serratia marcescens NOT DETECTED NOT DETECTED Final   Haemophilus influenzae NOT DETECTED NOT DETECTED Final   Neisseria meningitidis NOT DETECTED NOT DETECTED Final  Pseudomonas aeruginosa NOT DETECTED NOT DETECTED Final   Stenotrophomonas maltophilia NOT DETECTED NOT DETECTED Final   Candida albicans NOT DETECTED NOT DETECTED Final   Candida auris NOT DETECTED NOT DETECTED Final   Candida glabrata NOT DETECTED NOT DETECTED Final   Candida krusei NOT DETECTED NOT DETECTED Final   Candida parapsilosis NOT DETECTED NOT DETECTED Final   Candida tropicalis NOT DETECTED NOT DETECTED Final   Cryptococcus neoformans/gattii NOT DETECTED NOT DETECTED Final   CTX-M ESBL NOT DETECTED NOT DETECTED Final   Carbapenem resistance IMP NOT DETECTED NOT DETECTED Final   Carbapenem resistance KPC NOT DETECTED NOT DETECTED Final   Carbapenem resistance NDM NOT DETECTED NOT DETECTED Final   Carbapenem resist OXA 48 LIKE NOT DETECTED NOT DETECTED Final   Carbapenem resistance VIM NOT DETECTED NOT DETECTED Final    Comment: Performed at [facility], [address], [place], [place] [address]    Labs: CBC: Recent Labs  Lab 12/31/22 1820 01/01/23 0423 01/02/23 0421 01/04/23 0417 01/05/23 0333  WBC 12.9* 9.3 9.0 9.0 7.3  NEUTROABS  --  7.6  --   --   --   HGB 11.1* 9.3* 9.8* 9.6* 9.6*   HCT 35.0* 29.1* 30.8* 30.6* 30.5*  MCV 79.2* 78.0* 78.8* 77.9* 78.0*  PLT 203 183 183 229 270   Basic Metabolic Panel: Recent Labs  Lab 01/01/23 0423 01/02/23 0421 01/03/23 0453 01/04/23 0417 01/05/23 0333  NA 138 134* 131* 135 138  K 3.3* 3.6 3.3* 3.3* 3.8  CL 111 111 105 108 111  CO2 18* 17* 18* 20* 18*  GLUCOSE 89 104* 92 129* 124*  BUN 15 12 10 9 7   CREATININE 1.03* 0.80 0.89 0.81 0.89  CALCIUM 8.1* 8.2* 8.0* 8.2* 8.2*  MG 1.9  --   --  2.1  --    Liver Function Tests: Recent Labs  Lab 12/31/22 1820 01/01/23 0423  AST 39 28  ALT 19 21  ALKPHOS 78 68  BILITOT 2.0* 1.0  PROT 6.2* 5.6*  ALBUMIN 3.0* 2.6*   CBG: Recent Labs  Lab 12/31/22 2357 01/03/23 1149  GLUCAP 102* 106*    Discharge time spent: greater than 30 minutes.  Signed: Tyrone Nine, MD [facility] 01/05/2023

## 2023-01-05 NOTE — Progress Notes (Signed)
Patient was provided with discharge education, patient verbalized understanding. Patient's daughter to pick her up this evening when she gets out of work.

## 2023-01-06 LAB — CULTURE, BLOOD (ROUTINE X 2): Culture: NO GROWTH

## 2023-11-11 ENCOUNTER — Other Ambulatory Visit: Payer: Self-pay | Admitting: Nurse Practitioner

## 2023-11-11 DIAGNOSIS — Z1231 Encounter for screening mammogram for malignant neoplasm of breast: Secondary | ICD-10-CM

## 2023-12-10 ENCOUNTER — Other Ambulatory Visit: Payer: Self-pay | Admitting: Nurse Practitioner

## 2023-12-10 DIAGNOSIS — Z1231 Encounter for screening mammogram for malignant neoplasm of breast: Secondary | ICD-10-CM

## 2023-12-11 ENCOUNTER — Encounter

## 2023-12-11 DIAGNOSIS — Z1231 Encounter for screening mammogram for malignant neoplasm of breast: Secondary | ICD-10-CM

## 2023-12-25 ENCOUNTER — Ambulatory Visit
Admission: RE | Admit: 2023-12-25 | Discharge: 2023-12-25 | Disposition: A | Discharge status: Home / Self Care | Source: Ambulatory Visit

## 2023-12-25 DIAGNOSIS — Z1231 Encounter for screening mammogram for malignant neoplasm of breast: Secondary | ICD-10-CM
